# Patient Record
Sex: Female | Born: 1965 | Race: White | Hispanic: No | Marital: Married | Smoking: Never smoker
Health system: Southern US, Community
[De-identification: ages and names within clinical notes are randomized; demographics above are authoritative.]

## PROBLEM LIST (undated history)

## (undated) DIAGNOSIS — R896 Abnormal cytological findings in specimens from other organs, systems and tissues: Secondary | ICD-10-CM

## (undated) DIAGNOSIS — K635 Polyp of colon: Secondary | ICD-10-CM

## (undated) DIAGNOSIS — R350 Frequency of micturition: Secondary | ICD-10-CM

## (undated) DIAGNOSIS — IMO0001 Reserved for inherently not codable concepts without codable children: Secondary | ICD-10-CM

## (undated) DIAGNOSIS — F329 Major depressive disorder, single episode, unspecified: Secondary | ICD-10-CM

## (undated) DIAGNOSIS — F32A Depression, unspecified: Secondary | ICD-10-CM

## (undated) DIAGNOSIS — N920 Excessive and frequent menstruation with regular cycle: Secondary | ICD-10-CM

## (undated) DIAGNOSIS — M8580 Other specified disorders of bone density and structure, unspecified site: Secondary | ICD-10-CM

## (undated) DIAGNOSIS — N83209 Unspecified ovarian cyst, unspecified side: Secondary | ICD-10-CM

## (undated) DIAGNOSIS — I951 Orthostatic hypotension: Secondary | ICD-10-CM

## (undated) DIAGNOSIS — D649 Anemia, unspecified: Secondary | ICD-10-CM

## (undated) DIAGNOSIS — N879 Dysplasia of cervix uteri, unspecified: Secondary | ICD-10-CM

## (undated) DIAGNOSIS — N946 Dysmenorrhea, unspecified: Secondary | ICD-10-CM

## (undated) DIAGNOSIS — R000 Tachycardia, unspecified: Secondary | ICD-10-CM

## (undated) DIAGNOSIS — R42 Dizziness and giddiness: Secondary | ICD-10-CM

## (undated) DIAGNOSIS — C801 Malignant (primary) neoplasm, unspecified: Secondary | ICD-10-CM

## (undated) DIAGNOSIS — R32 Unspecified urinary incontinence: Secondary | ICD-10-CM

## (undated) DIAGNOSIS — Z8619 Personal history of other infectious and parasitic diseases: Secondary | ICD-10-CM

## (undated) DIAGNOSIS — K921 Melena: Secondary | ICD-10-CM

## (undated) DIAGNOSIS — A499 Bacterial infection, unspecified: Secondary | ICD-10-CM

## (undated) DIAGNOSIS — B009 Herpesviral infection, unspecified: Secondary | ICD-10-CM

## (undated) DIAGNOSIS — B379 Candidiasis, unspecified: Secondary | ICD-10-CM

## (undated) DIAGNOSIS — F419 Anxiety disorder, unspecified: Secondary | ICD-10-CM

## (undated) DIAGNOSIS — K625 Hemorrhage of anus and rectum: Secondary | ICD-10-CM

## (undated) DIAGNOSIS — R5383 Other fatigue: Secondary | ICD-10-CM

## (undated) DIAGNOSIS — B373 Candidiasis of vulva and vagina: Secondary | ICD-10-CM

## (undated) DIAGNOSIS — I959 Hypotension, unspecified: Secondary | ICD-10-CM

## (undated) DIAGNOSIS — F3281 Premenstrual dysphoric disorder: Secondary | ICD-10-CM

## (undated) DIAGNOSIS — N840 Polyp of corpus uteri: Secondary | ICD-10-CM

## (undated) DIAGNOSIS — B3731 Acute candidiasis of vulva and vagina: Secondary | ICD-10-CM

## (undated) DIAGNOSIS — B977 Papillomavirus as the cause of diseases classified elsewhere: Secondary | ICD-10-CM

## (undated) DIAGNOSIS — A6920 Lyme disease, unspecified: Secondary | ICD-10-CM

## (undated) DIAGNOSIS — N3289 Other specified disorders of bladder: Secondary | ICD-10-CM

## (undated) DIAGNOSIS — A630 Anogenital (venereal) warts: Secondary | ICD-10-CM

## (undated) DIAGNOSIS — K649 Unspecified hemorrhoids: Secondary | ICD-10-CM

## (undated) HISTORY — DX: Candidiasis, unspecified: B37.9

## (undated) HISTORY — DX: Anxiety disorder, unspecified: F41.9

## (undated) HISTORY — DX: Anogenital (venereal) warts: A63.0

## (undated) HISTORY — DX: Tachycardia, unspecified: R00.0

## (undated) HISTORY — DX: Hemorrhage of anus and rectum: K62.5

## (undated) HISTORY — DX: Other specified disorders of bladder: N32.89

## (undated) HISTORY — DX: Unspecified ovarian cyst, unspecified side: N83.209

## (undated) HISTORY — DX: Abnormal cytological findings in specimens from other organs, systems and tissues: R89.6

## (undated) HISTORY — DX: Other fatigue: R53.83

## (undated) HISTORY — DX: Herpesviral infection, unspecified: B00.9

## (undated) HISTORY — DX: Depression, unspecified: F32.A

## (undated) HISTORY — DX: Other specified disorders of bone density and structure, unspecified site: M85.80

## (undated) HISTORY — DX: Candidiasis of vulva and vagina: B37.3

## (undated) HISTORY — DX: Acute candidiasis of vulva and vagina: B37.31

## (undated) HISTORY — DX: Major depressive disorder, single episode, unspecified: F32.9

## (undated) HISTORY — DX: Dysplasia of cervix uteri, unspecified: N87.9

## (undated) HISTORY — DX: Unspecified urinary incontinence: R32

## (undated) HISTORY — DX: Unspecified hemorrhoids: K64.9

## (undated) HISTORY — PX: HYSTEROSCOPY: SHX211

## (undated) HISTORY — DX: Polyp of colon: K63.5

## (undated) HISTORY — DX: Papillomavirus as the cause of diseases classified elsewhere: B97.7

## (undated) HISTORY — DX: Premenstrual dysphoric disorder: F32.81

## (undated) HISTORY — DX: Orthostatic hypotension: I95.1

## (undated) HISTORY — DX: Frequency of micturition: R35.0

## (undated) HISTORY — DX: Bacterial infection, unspecified: A49.9

## (undated) HISTORY — DX: Polyp of corpus uteri: N84.0

## (undated) HISTORY — DX: Hypotension, unspecified: I95.9

## (undated) HISTORY — DX: Reserved for inherently not codable concepts without codable children: IMO0001

## (undated) HISTORY — PX: NOVASURE ABLATION: SHX5394

## (undated) HISTORY — DX: Excessive and frequent menstruation with regular cycle: N92.0

## (undated) HISTORY — DX: Dizziness and giddiness: R42

## (undated) HISTORY — DX: Lyme disease, unspecified: A69.20

## (undated) HISTORY — PX: DILATION AND CURETTAGE OF UTERUS: SHX78

## (undated) HISTORY — DX: Anemia, unspecified: D64.9

## (undated) HISTORY — DX: Personal history of other infectious and parasitic diseases: Z86.19

## (undated) HISTORY — DX: Dysmenorrhea, unspecified: N94.6

## (undated) HISTORY — DX: Melena: K92.1

---

## 1998-03-11 ENCOUNTER — Inpatient Hospital Stay (HOSPITAL_COMMUNITY): Admission: AD | Admit: 1998-03-11 | Discharge: 1998-03-11 | Payer: Self-pay | Admitting: Obstetrics and Gynecology

## 1998-04-06 ENCOUNTER — Inpatient Hospital Stay (HOSPITAL_COMMUNITY): Admission: AD | Admit: 1998-04-06 | Discharge: 1998-04-08 | Payer: Self-pay | Admitting: Obstetrics and Gynecology

## 1998-07-29 ENCOUNTER — Encounter: Admission: RE | Admit: 1998-07-29 | Discharge: 1998-10-27 | Payer: Self-pay | Admitting: Family Medicine

## 1999-05-05 ENCOUNTER — Ambulatory Visit (HOSPITAL_COMMUNITY): Admission: RE | Admit: 1999-05-05 | Discharge: 1999-05-05 | Payer: Self-pay | Admitting: *Deleted

## 2001-07-26 ENCOUNTER — Other Ambulatory Visit: Admission: RE | Admit: 2001-07-26 | Discharge: 2001-07-26 | Payer: Self-pay | Admitting: Obstetrics and Gynecology

## 2002-02-19 ENCOUNTER — Encounter: Payer: Self-pay | Admitting: Obstetrics and Gynecology

## 2002-02-19 ENCOUNTER — Inpatient Hospital Stay (HOSPITAL_COMMUNITY): Admission: AD | Admit: 2002-02-19 | Discharge: 2002-02-19 | Payer: Self-pay | Admitting: Obstetrics and Gynecology

## 2002-06-12 ENCOUNTER — Inpatient Hospital Stay (HOSPITAL_COMMUNITY): Admission: AD | Admit: 2002-06-12 | Discharge: 2002-06-12 | Payer: Self-pay | Admitting: Obstetrics and Gynecology

## 2002-06-13 ENCOUNTER — Encounter: Payer: Self-pay | Admitting: Obstetrics and Gynecology

## 2002-06-13 ENCOUNTER — Inpatient Hospital Stay (HOSPITAL_COMMUNITY): Admission: AD | Admit: 2002-06-13 | Discharge: 2002-06-13 | Payer: Self-pay | Admitting: Obstetrics and Gynecology

## 2002-08-25 ENCOUNTER — Inpatient Hospital Stay (HOSPITAL_COMMUNITY): Admission: AD | Admit: 2002-08-25 | Discharge: 2002-08-25 | Payer: Self-pay | Admitting: Obstetrics and Gynecology

## 2002-08-30 ENCOUNTER — Inpatient Hospital Stay (HOSPITAL_COMMUNITY): Admission: AD | Admit: 2002-08-30 | Discharge: 2002-08-30 | Payer: Self-pay | Admitting: Obstetrics and Gynecology

## 2002-09-01 ENCOUNTER — Inpatient Hospital Stay (HOSPITAL_COMMUNITY): Admission: AD | Admit: 2002-09-01 | Discharge: 2002-09-04 | Payer: Self-pay | Admitting: Obstetrics and Gynecology

## 2002-09-08 ENCOUNTER — Inpatient Hospital Stay (HOSPITAL_COMMUNITY): Admission: AD | Admit: 2002-09-08 | Discharge: 2002-09-08 | Payer: Self-pay | Admitting: Obstetrics & Gynecology

## 2002-10-15 ENCOUNTER — Other Ambulatory Visit: Admission: RE | Admit: 2002-10-15 | Discharge: 2002-10-15 | Payer: Self-pay | Admitting: Obstetrics and Gynecology

## 2004-01-07 ENCOUNTER — Other Ambulatory Visit: Admission: RE | Admit: 2004-01-07 | Discharge: 2004-01-07 | Payer: Self-pay | Admitting: Obstetrics and Gynecology

## 2004-07-13 ENCOUNTER — Ambulatory Visit (HOSPITAL_COMMUNITY): Admission: RE | Admit: 2004-07-13 | Discharge: 2004-07-13 | Payer: Self-pay | Admitting: Family Medicine

## 2004-11-02 ENCOUNTER — Ambulatory Visit: Payer: Self-pay | Admitting: Gastroenterology

## 2004-11-11 ENCOUNTER — Ambulatory Visit: Payer: Self-pay | Admitting: Gastroenterology

## 2005-02-15 ENCOUNTER — Other Ambulatory Visit: Admission: RE | Admit: 2005-02-15 | Discharge: 2005-02-15 | Payer: Self-pay | Admitting: Obstetrics and Gynecology

## 2005-10-27 ENCOUNTER — Ambulatory Visit (HOSPITAL_COMMUNITY): Admission: RE | Admit: 2005-10-27 | Discharge: 2005-10-27 | Payer: Self-pay | Admitting: Interventional Cardiology

## 2005-12-01 ENCOUNTER — Encounter: Admission: RE | Admit: 2005-12-01 | Discharge: 2005-12-01 | Payer: Self-pay | Admitting: Obstetrics and Gynecology

## 2006-02-16 ENCOUNTER — Other Ambulatory Visit: Admission: RE | Admit: 2006-02-16 | Discharge: 2006-02-16 | Payer: Self-pay | Admitting: Obstetrics and Gynecology

## 2006-12-06 ENCOUNTER — Encounter: Admission: RE | Admit: 2006-12-06 | Discharge: 2006-12-06 | Payer: Self-pay | Admitting: Family Medicine

## 2007-06-08 ENCOUNTER — Ambulatory Visit (HOSPITAL_COMMUNITY): Admission: RE | Admit: 2007-06-08 | Discharge: 2007-06-08 | Payer: Self-pay | Admitting: Family Medicine

## 2008-01-29 ENCOUNTER — Encounter: Admission: RE | Admit: 2008-01-29 | Discharge: 2008-01-29 | Payer: Self-pay | Admitting: Family Medicine

## 2008-11-28 ENCOUNTER — Encounter: Admission: RE | Admit: 2008-11-28 | Discharge: 2008-11-28 | Payer: Self-pay | Admitting: Family Medicine

## 2009-06-23 ENCOUNTER — Encounter: Admission: RE | Admit: 2009-06-23 | Discharge: 2009-06-23 | Payer: Self-pay | Admitting: Family Medicine

## 2009-06-26 ENCOUNTER — Encounter (INDEPENDENT_AMBULATORY_CARE_PROVIDER_SITE_OTHER): Payer: Self-pay | Admitting: Obstetrics and Gynecology

## 2009-06-26 ENCOUNTER — Ambulatory Visit (HOSPITAL_COMMUNITY): Admission: RE | Admit: 2009-06-26 | Discharge: 2009-06-26 | Payer: Self-pay | Admitting: Obstetrics and Gynecology

## 2009-10-08 ENCOUNTER — Encounter (INDEPENDENT_AMBULATORY_CARE_PROVIDER_SITE_OTHER): Payer: Self-pay | Admitting: *Deleted

## 2010-06-10 ENCOUNTER — Encounter (INDEPENDENT_AMBULATORY_CARE_PROVIDER_SITE_OTHER): Payer: Self-pay | Admitting: *Deleted

## 2010-09-28 ENCOUNTER — Ambulatory Visit (HOSPITAL_COMMUNITY)
Admission: RE | Admit: 2010-09-28 | Discharge: 2010-09-28 | Payer: Self-pay | Source: Home / Self Care | Admitting: Obstetrics and Gynecology

## 2010-11-14 ENCOUNTER — Encounter: Payer: Self-pay | Admitting: Interventional Cardiology

## 2010-11-14 ENCOUNTER — Other Ambulatory Visit: Payer: Self-pay | Admitting: Obstetrics and Gynecology

## 2010-11-14 DIAGNOSIS — Z1231 Encounter for screening mammogram for malignant neoplasm of breast: Secondary | ICD-10-CM

## 2010-11-14 DIAGNOSIS — Z1239 Encounter for other screening for malignant neoplasm of breast: Secondary | ICD-10-CM

## 2010-11-15 ENCOUNTER — Encounter: Payer: Self-pay | Admitting: Family Medicine

## 2010-11-24 ENCOUNTER — Encounter (INDEPENDENT_AMBULATORY_CARE_PROVIDER_SITE_OTHER): Payer: Self-pay | Admitting: *Deleted

## 2010-11-26 NOTE — Letter (Signed)
Summary: Pre Visit Letter Revised  [facility]  [address] [place], [place] [address]   Phone: [phone]  Fax: [phone]    06/10/2010 MRN: [id number]     Autumn Foster [address] [place], [place]  [address]              Procedure Date:  08/11/2010    Welcome to the Gastroenterology Division at [facility].    You are scheduled to see a nurse for your pre-procedure visit on 08/03/2010 at 1:00pm on the 3rd floor at [facility], [address]  We ask that you try to arrive at our office 15 minutes prior to your appointment time to allow for check-in.  Please take a minute to review the attached form.  If you answer "Yes" to one or more of the questions on the first page, we ask that you call the person listed at your earliest opportunity.  If you answer "No" to all of the questions, please complete the rest of the form and bring it to your appointment.    Your nurse visit will consist of discussing your medical and surgical history, your immediate family medical history, and your medications.    If you are unable to list all of your medications on the form, please bring the medication bottles to your appointment and we will list them.  We will need to be aware of both prescribed and over the counter drugs.  We will need to know exact dosage information as well.    Please be prepared to read and sign documents such as consent forms, a financial agreement, and acknowledgement forms.  If necessary, and with your consent, a friend or relative is welcome to sit-in on the nurse visit with you.  Please bring your insurance card so that we may make a copy of it.  If your insurance requires a referral to see a specialist, please bring your referral form from your primary care physician.  No co-pay is required for this nurse visit.     If you cannot keep your appointment, please call [phone] to cancel or reschedule prior to your appointment date.  This  allows [place] the opportunity to schedule an appointment for another patient in need of care.   Thank you for choosing [facility] for your medical needs.  We appreciate the opportunity to care for you.  Please visit [place] at our website  to learn more about our practice.           Sincerely,  The Gastroenterology Division

## 2010-12-01 ENCOUNTER — Ambulatory Visit: Payer: Self-pay

## 2010-12-02 NOTE — Letter (Signed)
Summary: Pre Visit Letter Revised  [facility]  [address] [place], [place] [address]   Phone: [phone]  Fax: [phone]        11/24/2010 MRN: [id number] Autumn Foster [address] [place], [place]  [address]             Procedure Date:  12/28/2010 @ 11:30   Recall colon-Dr. Russella Dar  Welcome to the Gastroenterology Division at [facility].    You are scheduled to see a nurse for your pre-procedure visit on 12/14/2010 at 8:30 on the 3rd floor at [facility], [address].  We ask that you try to arrive at our office 15 minutes prior to your appointment time to allow for check-in.  Please take a minute to review the attached form.  If you answer "Yes" to one or more of the questions on the first page, we ask that you call the person listed at your earliest opportunity.  If you answer "No" to all of the questions, please complete the rest of the form and bring it to your appointment.    Your nurse visit will consist of discussing your medical and surgical history, your immediate family medical history, and your medications.   If you are unable to list all of your medications on the form, please bring the medication bottles to your appointment and we will list them.  We will need to be aware of both prescribed and over the counter drugs.  We will need to know exact dosage information as well.    Please be prepared to read and sign documents such as consent forms, a financial agreement, and acknowledgement forms.  If necessary, and with your consent, a friend or relative is welcome to sit-in on the nurse visit with you.  Please bring your insurance card so that we may make a copy of it.  If your insurance requires a referral to see a specialist, please bring your referral form from your primary care physician.  No co-pay is required for this nurse visit.     If you cannot keep your appointment, please call [phone] to cancel or reschedule prior to your  appointment date.  This allows [place] the opportunity to schedule an appointment for another patient in need of care.    Thank you for choosing [facility] for your medical needs.  We appreciate the opportunity to care for you.  Please visit [place] at our website  to learn more about our practice.  Sincerely, The Gastroenterology Division

## 2010-12-03 ENCOUNTER — Ambulatory Visit: Payer: Self-pay

## 2010-12-15 ENCOUNTER — Encounter (INDEPENDENT_AMBULATORY_CARE_PROVIDER_SITE_OTHER): Payer: Self-pay | Admitting: *Deleted

## 2010-12-16 ENCOUNTER — Ambulatory Visit
Admission: RE | Admit: 2010-12-16 | Discharge: 2010-12-16 | Disposition: A | Discharge status: Home / Self Care | Payer: BC Managed Care – PPO | Source: Ambulatory Visit | Attending: Obstetrics and Gynecology | Admitting: Obstetrics and Gynecology

## 2010-12-16 ENCOUNTER — Encounter: Payer: Self-pay | Admitting: Gastroenterology

## 2010-12-16 DIAGNOSIS — Z1231 Encounter for screening mammogram for malignant neoplasm of breast: Secondary | ICD-10-CM

## 2010-12-22 NOTE — Letter (Signed)
Summary: [facility] Instructions  [facility]  [address] [place], [place] [address]   Phone: [phone]  Fax: [phone]       Autumn Foster    25-Dec-1965    MRN: [id number]        Procedure Day Dorna Bloom:  Duanne Limerick 12/28/10     Arrival Time: 10:30am     Procedure Time:  11:30am     Location of Procedure:                    _X _  [facility] (4th Floor)                       PREPARATION FOR COLONOSCOPY WITH MOVIPREP   Starting 5 days prior to your procedure  [facility] : [facility] 02/29  do not eat nuts, seeds, popcorn, corn, beans, peas,  salads, or any raw vegetables.  Do not take any fiber supplements (e.g. Metamucil, Citrucel, and Benefiber).  THE DAY BEFORE YOUR PROCEDURE         DATE: SUNDAY 03/04  1.  Drink clear liquids the entire day-NO SOLID FOOD  2.  Do not drink anything colored red or purple.  Avoid juices with pulp.  No orange juice.  3.  Drink at least 64 oz. (8 glasses) of fluid/clear liquids during the day to prevent dehydration and help the prep work efficiently.  CLEAR LIQUIDS INCLUDE: Water Jello Ice Popsicles Tea (sugar ok, no milk/cream) Powdered fruit flavored drinks Coffee (sugar ok, no milk/cream) Gatorade Juice: apple, white grape, white cranberry  Lemonade Clear bullion, consomm, broth Carbonated beverages (any kind) Strained chicken noodle soup Hard Candy                             4.  In the morning, mix first dose of MoviPrep solution:    Empty 1 Pouch A and 1 Pouch B into the disposable container    Add lukewarm drinking water to the top line of the container. Mix to dissolve    Refrigerate (mixed solution should be used within 24 hrs)  5.  Begin drinking the prep at 5:00 p.m. The MoviPrep container is divided by 4 marks.   Every 15 minutes drink the solution down to the next mark (approximately 8 oz) until the full liter is complete.   6.  Follow completed prep with 16 oz of clear liquid of your choice (Nothing  red or purple).  Continue to drink clear liquids until bedtime.  7.  Before going to bed, mix second dose of MoviPrep solution:    Empty 1 Pouch A and 1 Pouch B into the disposable container    Add lukewarm drinking water to the top line of the container. Mix to dissolve    Refrigerate  THE DAY OF YOUR PROCEDURE      DATE: MONDAY 03/05  Beginning at  6:30a.m. (5 hours before procedure):         1. Every 15 minutes, drink the solution down to the next mark (approx 8 oz) until the full liter is complete.  2. Follow completed prep with 16 oz. of clear liquid of your choice.    3. You may drink clear liquids until 9:30am  (2 HOURS BEFORE PROCEDURE).   MEDICATION INSTRUCTIONS  Unless otherwise instructed, you should take regular prescription medications with a small sip of water   as early as possible the morning of your  procedure.      OTHER INSTRUCTIONS  You will need a responsible adult at least 46 years of age to accompany you and drive you home.   This person must remain in the waiting room during your procedure.  Wear loose fitting clothing that is easily removed.  Leave jewelry and other valuables at home.  However, you may wish to bring a book to read or  an iPod/MP3 player to listen to music as you wait for your procedure to start.  Remove all body piercing jewelry and leave at home.  Total time from sign-in until discharge is approximately 2-3 hours.  You should go home directly after your procedure and rest.  You can resume normal activities the  day after your procedure.  The day of your procedure you should not:   Drive   Make legal decisions   Operate machinery   Drink alcohol   Return to work  You will receive specific instructions about eating, activities and medications before you leave.    The above instructions have been reviewed and explained to me by   Karl Bales RN  December 16, 2010 4:01 PM   I fully understand and can  verbalize these instructions _____________________________ Date _________

## 2010-12-22 NOTE — Miscellaneous (Signed)
Summary: [facility] previsit  Clinical Lists Changes  Medications: Added new medication of MOVIPREP 100 GM  SOLR (PEG-KCL-NACL-NASULF-NA ASC-C) As per prep instructions. - Signed Rx of MOVIPREP 100 GM  SOLR (PEG-KCL-NACL-NASULF-NA ASC-C) As per prep instructions.;  #1 x 0;  Signed;  Entered by: Karl Bales RN;  Authorized by: Meryl Dare MD [facility];  Method used: Electronically to [facility] # [id number]*, [address], [place], [place]  [address], Ph: [phone], Fax: [phone] Allergies: Added new allergy or adverse reaction of PCN Observations: Added new observation of NKA: F (12/16/2010 15:33)    Prescriptions: MOVIPREP 100 GM  SOLR (PEG-KCL-NACL-NASULF-NA ASC-C) As per prep instructions.  #1 x 0   Entered by:   Karl Bales RN   Authorized by:   Meryl Dare MD [facility]   Signed by:   Karl Bales RN on 12/16/2010   Method used:   Electronically to        [facility] [address] (retail)       [address]       [place], [place]  [address]       Ph: [phone]       Fax: [phone]   RxID:   [id number]

## 2010-12-28 ENCOUNTER — Other Ambulatory Visit: Payer: Self-pay | Admitting: Gastroenterology

## 2010-12-28 ENCOUNTER — Other Ambulatory Visit (AMBULATORY_SURGERY_CENTER): Payer: BC Managed Care – PPO | Admitting: Gastroenterology

## 2010-12-28 DIAGNOSIS — Z800 Family history of malignant neoplasm of digestive organs: Secondary | ICD-10-CM

## 2010-12-28 DIAGNOSIS — K648 Other hemorrhoids: Secondary | ICD-10-CM

## 2010-12-28 DIAGNOSIS — D126 Benign neoplasm of colon, unspecified: Secondary | ICD-10-CM

## 2010-12-28 DIAGNOSIS — Z1211 Encounter for screening for malignant neoplasm of colon: Secondary | ICD-10-CM

## 2010-12-29 HISTORY — PX: COLONOSCOPY: SHX174

## 2010-12-31 ENCOUNTER — Encounter: Payer: Self-pay | Admitting: Gastroenterology

## 2011-01-04 LAB — CBC
MCH: 33.5 pg (ref 26.0–34.0)
MCV: 98.4 fL (ref 78.0–100.0)
RBC: 3.98 MIL/uL (ref 3.87–5.11)
RDW: 13.1 % (ref 11.5–15.5)

## 2011-01-04 LAB — VITAMIN D 1,25 DIHYDROXY
Vitamin D 1, 25 (OH)2 Total: 61 pg/mL (ref 18–72)
Vitamin D2 1, 25 (OH)2: <8 pg/mL
Vitamin D3 1, 25 (OH)2: 61 pg/mL

## 2011-01-05 NOTE — Procedures (Addendum)
Summary: Colonoscopy  Patient: Autumn Foster Note: All result statuses are Final unless otherwise noted.  Tests: (1) Colonoscopy (COL)   COL Colonoscopy           DONE     [facility]     [address]     [place], [place]  [address]          COLONOSCOPY PROCEDURE REPORT     PATIENT:  Libra, Gatz  MR#:  [id number]     BIRTHDATE:  08-16-1966, 45 yrs. old  GENDER:  female     ENDOSCOPIST:  Judie Petit T. Russella Dar, MD, [facility]          PROCEDURE DATE:  12/28/2010     PROCEDURE:  Colonoscopy with snare polypectomy     ASA CLASS:  Class II     INDICATIONS:  1) Elevated Risk Screening  2) family history of     colon cancer: PGF.     MEDICATIONS:   Fentanyl 50 mcg IV, Versed 6 mg IV, Benadryl 25 mg     IV     DESCRIPTION OF PROCEDURE:   After the risks benefits and     alternatives of the procedure were thoroughly explained, informed     consent was obtained.  Digital rectal exam was performed and     revealed external hemorrhoids., small. The LB PCF-H180AL [id number]     endoscope was introduced through the anus and advanced to the     cecum, which was identified by both the appendix and ileocecal     valve, limited by a tortuous colon.  The quality of the prep was     excellent, using MoviPrep.  The instrument was then slowly     withdrawn as the colon was fully examined.     <<PROCEDUREIMAGES>>     FINDINGS:  A sessile polyp was found in the descending colon. It     was 5 mm in size. Polyp was snared without cautery. Retrieval was     successful.  A sessile polyp was found in the sigmoid colon. It     was 5 mm in size. Polyp was snared without cautery.  Retrieval was     successful. A normal appearing cecum, ileocecal valve, and     appendiceal orifice were identified. The ascending, hepatic     flexure, transverse, splenic flexure, and rectum appeared     unremarkable. Retroflexed views in the rectum revealed internal     hemorrhoids, small. The time to cecum =  4.5  minutes. The  scope     was then withdrawn (time =  12.75  min) from the patient and the     procedure completed.          COMPLICATIONS:  None          ENDOSCOPIC IMPRESSION:     1) 5 mm sessile polyp in the descending colon     2) 5 mm sessile polyp in the sigmoid colon     3) Internal and external hemorrhoids          RECOMMENDATIONS:     1) Await pathology results     2) If the polyps are adenomatous (pre-cancerous), colonoscopy in     5 years. Otherwise follow colorectal cancer screening guidelines     for "routine risk" patients with colonoscopy in 10 years.          Venita Lick. Russella Dar, MD, Clement[facility] Graham  CC:  Merri Brunette, MD          n.     Rosalie DoctorVenita Lick. Stark at 12/28/2010 12:22 PM          Murtis Sink, [id number]  Note: An exclamation mark (!) indicates a result that was not dispersed into the flowsheet. Document Creation Date: 12/28/2010 12:22 PM _______________________________________________________________________  (1) Order result status: Final Collection or observation date-time: 12/28/2010 12:14 Requested date-time:  Receipt date-time:  Reported date-time:  Referring Physician:   Ordering Physician: Claudette Head [phone]) Specimen Source:  Source: Launa Grill Order Number: [id number] Lab site:   Appended Document: Colonoscopy

## 2011-01-12 NOTE — Letter (Signed)
Summary: Patient Notice- Polyp Results  [facility]  [address] [place], [place] [address]   Phone: [phone]  Fax: [phone]        December 31, 2010 MRN: [id number]    [facility] [address] [place], [place]  [address]    Dear Ms. Chandonnet,  I am pleased to inform you that the colon polyp(s) removed during your recent colonoscopy was (were) found to be benign (no cancer detected) upon pathologic examination.  I recommend you have a repeat colonoscopy examination in 5 years to look for recurrent polyps, as having colon polyps increases your risk for having recurrent polyps or even colon cancer in the future.  Should you develop new or worsening symptoms of abdominal pain, bowel habit changes or bleeding from the rectum or bowels, please schedule an evaluation with either your primary care physician or with me.  Continue treatment plan as outlined the day of your exam.  Please call us if you are having persistent problems or have questions about your condition that have not been fully answered at this time.  Sincerely,  Meryl Dare MD [facility]  This letter has been electronically signed by your physician.  Appended Document: Patient Notice- Polyp Results letter mailed

## 2011-01-29 LAB — CBC
HCT: 38.7 % (ref 36.0–46.0)
Hemoglobin: 13.1 g/dL (ref 12.0–15.0)
MCHC: 33.9 g/dL (ref 30.0–36.0)
Platelets: 213 10*3/uL (ref 150–400)
RBC: 4.03 MIL/uL (ref 3.87–5.11)
RDW: 13.4 % (ref 11.5–15.5)

## 2011-01-29 LAB — CARBOXYHEMOGLOBIN: Methemoglobin: 0.9 % (ref 0.0–1.5)

## 2011-01-29 LAB — VITAMIN D 1,25 DIHYDROXY

## 2011-03-09 NOTE — Op Note (Signed)
NAMEZENIAH, Autumn Foster                ACCOUNT NO.:  [id number]   MEDICAL RECORD NO.:  [id number]          PATIENT TYPE:  AMB   LOCATION:  SDC                           FACILITY:  [facility]   PHYSICIAN:  Hal Morales, M.D.DATE OF BIRTH:  Dec 14, 1965   DATE OF PROCEDURE:  06/26/2009  DATE OF DISCHARGE:  06/26/2009                               OPERATIVE REPORT   PREOPERATIVE DIAGNOSIS:  Atypical glandular cells of undetermined  significance on Pap smear, question of endometrial polyp.   POSTOPERATIVE DIAGNOSIS:  Atypical glandular cells of undetermined  significance on Pap smear.   PROCEDURES:  Hysteroscopy, dilatation and curettage.   SURGEON:  Hal Morales, MD   ANESTHESIA:  General, LMA.   ESTIMATED BLOOD LOSS:  Less than 10 mL.   COMPLICATIONS:  None.   FINDINGS:  Uterus sounded to 8 cm.  At the time of hysteroscopy, no  specific lesions were noted though there was shaggy endometrium  throughout.  It should be noted that the patient is on the last day of  her menstrual cycle.   PROCEDURE IN DETAILS:  The patient was taken to the operating room after  appropriate identification and placed on the operating table.  After the  attainment of adequate general anesthesia, she was placed in the  lithotomy position.  The perineum and vagina were prepped with multiple  layers of Betadine and a red Robinson catheter was used to empty the  bladder.  The perineum was draped as a sterile field.  A Graves speculum  was placed in the vagina and a paracervical block achieved with a total  of 10 mL of 2% Xylocaine in the 5 and 7 o'clock positions.  The uterus  was then sounded and the cervix successively dilated to accommodate the  hysteroscope.  The hysteroscope was then used to reveal and document the  above-noted findings.  The hysteroscope was removed and curettage of all  quadrants of the uterus was undertaken.  The hysteroscope was then  replaced and documentation of clearing of the  endometrial cavity was  noted.  All instruments were then removed from the vagina and the  patient awakened from general anesthesia and taken to the recovery room  in satisfactory condition having tolerated the procedure well with  sponge and instrument counts correct.   The patient was given Toradol 30 mg IV and 30 mg IM prior to leaving the  operating room.   DISCHARGE INSTRUCTIONS:  Printed instructions for D and C from the  [facility].   DISCHARGE MEDICATIONS:  Ibuprofen 600 mg p.o. q.6 h. for 4 doses, then  p.r.n. pain.   FOLLOWUP:  The patient will follow up with Dr. Pennie Rushing in 2 weeks.      Hal Morales, M.D.  Electronically Signed     VPH/MEDQ  D:  06/26/2009  T:  06/27/2009  Job:  [id number]

## 2011-03-31 ENCOUNTER — Other Ambulatory Visit: Payer: Self-pay | Admitting: Obstetrics and Gynecology

## 2011-03-31 DIAGNOSIS — N63 Unspecified lump in unspecified breast: Secondary | ICD-10-CM

## 2011-03-31 DIAGNOSIS — N649 Disorder of breast, unspecified: Secondary | ICD-10-CM

## 2011-04-07 ENCOUNTER — Other Ambulatory Visit: Payer: BC Managed Care – PPO

## 2011-11-17 ENCOUNTER — Other Ambulatory Visit: Payer: Self-pay | Admitting: Obstetrics and Gynecology

## 2011-11-17 DIAGNOSIS — Z1231 Encounter for screening mammogram for malignant neoplasm of breast: Secondary | ICD-10-CM

## 2012-01-20 ENCOUNTER — Ambulatory Visit: Payer: BC Managed Care – PPO

## 2012-01-25 ENCOUNTER — Ambulatory Visit
Admission: RE | Admit: 2012-01-25 | Discharge: 2012-01-25 | Disposition: A | Discharge status: Home / Self Care | Payer: BC Managed Care – PPO | Source: Ambulatory Visit | Attending: Obstetrics and Gynecology | Admitting: Obstetrics and Gynecology

## 2012-01-25 DIAGNOSIS — Z1231 Encounter for screening mammogram for malignant neoplasm of breast: Secondary | ICD-10-CM

## 2012-05-10 ENCOUNTER — Ambulatory Visit: Payer: Self-pay | Admitting: Obstetrics and Gynecology

## 2012-05-26 ENCOUNTER — Encounter: Payer: Self-pay | Admitting: Obstetrics and Gynecology

## 2012-05-26 ENCOUNTER — Ambulatory Visit (INDEPENDENT_AMBULATORY_CARE_PROVIDER_SITE_OTHER): Payer: BC Managed Care – PPO | Admitting: Obstetrics and Gynecology

## 2012-05-26 VITALS — BP 96/60 | HR 66 | Resp 14 | Ht 72.0 in | Wt 152.0 lb

## 2012-05-26 DIAGNOSIS — Z01419 Encounter for gynecological examination (general) (routine) without abnormal findings: Secondary | ICD-10-CM

## 2012-05-26 DIAGNOSIS — Z124 Encounter for screening for malignant neoplasm of cervix: Secondary | ICD-10-CM

## 2012-05-26 DIAGNOSIS — F529 Unspecified sexual dysfunction not due to a substance or known physiological condition: Secondary | ICD-10-CM

## 2012-05-26 MED ORDER — AMBULATORY NON FORMULARY MEDICATION: 0.5000 cm | Status: DC | PRN

## 2012-05-26 NOTE — Progress Notes (Signed)
Subjective:    Autumn Foster is a 46 y.o. female, [phone], who presents for an annual exam. The patient reports more difficulty desiring sex and that having an orgasm is more difficult.  Denies any relationship issues and has a supportive partner.  Menstrual cycle:   LMP: No LMP recorded. Patient has had an ablation.             Review of Systems Pertinent items are noted in HPI. Denies pelvic pain, urinary tract symptoms, vaginitis symptoms, irregular bleeding, menopausal symptoms, change in bowel habits or rectal bleeding   Objective:    BP 96/60  Pulse 66  Resp 14  Ht 6' (1.829 m)  Wt 152 lb (68.947 kg)  BMI 20.61 kg/m2   Wt Readings from Last 1 Encounters:  05/26/12 152 lb (68.947 kg)   Body mass index is 20.61 kg/(m^2). General Appearance: Alert, no acute distress HEENT: Grossly normal Neck / Thyroid: Supple, no thyromegaly or cervical adenopathy Lungs: Clear to auscultation bilaterally Back: No CVA tenderness Breast Exam: No masses or nodes.No dimpling, nipple retraction or discharge. Cardiovascular: Regular rate and rhythm.  Gastrointestinal: Soft, non-tender, no masses or organomegaly Pelvic Exam: EGBUS-wnl, vagina-normal rugae, cervix- without lesions or tenderness, uterus appears normal size shape and consistency, adnexae-no masses or tenderness Rectovaginal: no masses and normal sphincter tone Lymphatic Exam: Non-palpable nodes in neck, clavicular,  axillary, or inguinal regions  Skin: no rashes or abnormalities Extremities: no clubbing cyanosis or edema  Neurologic: grossly normal Psychiatric: Alert and oriented    Assessment:   Routine GYN Exam Decreased Libido   Plan:  Testosterone-pending  Yell Gel # 10 grams apply small amount to clitoral area 30 minutes before intercourse 6 refills  Given written and online resources to explore for female libido issues  PAP sent  RTO 1 year or prn  Tywone Bembenek,ELMIRAPA-C

## 2012-05-26 NOTE — Progress Notes (Signed)
Regular Periods: no Mammogram: yes 01/2012 WNL  Monthly Breast Ex.: no Exercise: yes  Tetanus < 10 years: no Seatbelts: yes  NI. Bladder Functn.: yes Abuse at home: no  Daily BM's: yes Stressful Work: yes  Healthy Diet: yes Sigmoid-Colonoscopy: "2012" Polyps  Calcium: yes Medical problems this year: pt wants to discuss that she has no sex drive.    LAST PAP:04/03/2011  Contraception: None  Mammogram:  01/2012 "WNL"  PCP: Severiano Gilbert at [facility]  PMH: No Changes  FMH: No Changes  Last Bone Scan: 03/31/11

## 2012-05-29 LAB — PAP IG W/ RFLX HPV ASCU

## 2012-05-29 LAB — TESTOSTERONE, FREE, TOTAL, SHBG
Sex Hormone Binding: 74 nmol/L (ref 18–114)
Testosterone: <10 ng/dL — ABNORMAL LOW (ref 10–70)

## 2012-05-30 ENCOUNTER — Telehealth: Payer: Self-pay | Admitting: Obstetrics and Gynecology

## 2012-05-30 NOTE — Telephone Encounter (Signed)
Left patient a message that a return call would be made to discuss results of labs.  Annabelle Rexroad, PA-C

## 2012-05-31 ENCOUNTER — Telehealth: Payer: Self-pay | Admitting: Obstetrics and Gynecology

## 2012-05-31 NOTE — Telephone Encounter (Signed)
TC FROM PT STATING THAT SHE WOULD LIKE FOR [place] TO CALL IN RX TESTERONE THAT SHE SPOKE TO EP ABOUT. ASK PT DID EP GO OVER RISKS AND PT STATED YES. WILL CALL IN TESTERONE 0.1 MG CREAM  #2 MONTH SUPPLY APPLY 0.1 MG  3 TIMES A WEEK WITH 0 REFILLS TO PT PHARMACY.

## 2012-05-31 NOTE — Telephone Encounter (Signed)
Call to patient to relay testosterone results-undetectable. Reviewed with patient the risks of excess testosterone to include, but not limited to: increased lipids, unwanted hair growth, female pattern baldness,deepening of voice, clitoromegaly, acne, oily skin and aggression. Patient would like to try a small amount of testosterone for 8 weeks to see if it makes a difference in her libido. Patient is currently on vacation, will return on August 17th and will call at that time to order through [facility] in [place], [place].  Will order:  Testosterone 0.1 mg Cream  # 2 month supply  apply 0.1 mg 3 times a week  no refills.   Seerat Peaden, PA-C

## 2012-05-31 NOTE — Telephone Encounter (Signed)
TRIAGE/EPIC °

## 2012-11-07 ENCOUNTER — Ambulatory Visit (INDEPENDENT_AMBULATORY_CARE_PROVIDER_SITE_OTHER): Payer: BC Managed Care – PPO | Admitting: Family Medicine

## 2012-11-07 ENCOUNTER — Encounter: Payer: Self-pay | Admitting: Family Medicine

## 2012-11-07 VITALS — BP 118/76 | HR 79 | Temp 98.7°F | Ht 72.0 in | Wt 155.0 lb

## 2012-11-07 DIAGNOSIS — F411 Generalized anxiety disorder: Secondary | ICD-10-CM

## 2012-11-07 DIAGNOSIS — Z7689 Persons encountering health services in other specified circumstances: Secondary | ICD-10-CM

## 2012-11-07 DIAGNOSIS — Z7189 Other specified counseling: Secondary | ICD-10-CM

## 2012-11-07 DIAGNOSIS — R109 Unspecified abdominal pain: Secondary | ICD-10-CM

## 2012-11-07 DIAGNOSIS — R350 Frequency of micturition: Secondary | ICD-10-CM

## 2012-11-07 DIAGNOSIS — F419 Anxiety disorder, unspecified: Secondary | ICD-10-CM

## 2012-11-07 LAB — CBC WITH DIFFERENTIAL/PLATELET
Basophils Absolute: 0 10*3/uL (ref 0.0–0.1)
Eosinophils Relative: 0.9 % (ref 0.0–5.0)
Lymphs Abs: 1.2 10*3/uL (ref 0.7–4.0)
Monocytes Absolute: 0.4 10*3/uL (ref 0.1–1.0)
Monocytes Relative: 7.6 % (ref 3.0–12.0)
Neutrophils Relative %: 65.9 % (ref 43.0–77.0)
Platelets: 220 10*3/uL (ref 150.0–400.0)
RDW: 13.3 % (ref 11.5–14.6)
WBC: 4.9 10*3/uL (ref 4.5–10.5)

## 2012-11-07 LAB — COMPREHENSIVE METABOLIC PANEL
ALT: 25 U/L (ref 0–35)
Albumin: 4.4 g/dL (ref 3.5–5.2)
Alkaline Phosphatase: 42 U/L (ref 39–117)
CO2: 27 mEq/L (ref 19–32)
Glucose, Bld: 91 mg/dL (ref 70–99)
Potassium: 4.3 mEq/L (ref 3.5–5.1)
Sodium: 138 mEq/L (ref 135–145)
Total Bilirubin: 0.8 mg/dL (ref 0.3–1.2)
Total Protein: 7.3 g/dL (ref 6.0–8.3)

## 2012-11-07 LAB — LIPID PANEL
Cholesterol: 196 mg/dL (ref 0–200)
LDL Cholesterol: 98 mg/dL (ref 0–99)
VLDL: 6.2 mg/dL (ref 0.0–40.0)

## 2012-11-07 LAB — HEMOGLOBIN A1C: Hgb A1c MFr Bld: 5.4 % (ref 4.6–6.5)

## 2012-11-07 NOTE — Progress Notes (Signed)
Chief Complaint  Patient presents with  . Establish Care  . abdomen discomfort    since before Thanksgiving;     HPI:  Autumn Foster is here to establish care. Previous PCP was Dr. Katrinka Blazing at [facility]. Pt had physical with Dr. Ivar Bury. (gynecologist in 8.2013). Work: homemaker, 3 children Home Situation: lives with 3 kids and husband Spiritual Beliefs:   Has the following chronic problems and concerns today:  Abdominal Discomfort: -started after cool sculpting a few months ago -is "discomfort" not pain, feels like a "stitch" -constant, no hx of abd surgeries -denies: nausea, vomiting, diarrhea, constipation, fevers, weight loss, belching, bloating -feels like stool smell bad - this is chronic -feels like abdomen is thick here - worried about cancer, grandfather had colon cancer (70yo) -she had colonoscopy last year with a polyp, Dr. Russella Dar with [facility]  Anxiety: -worries all the time -sometimes feels panicked -does not see a counselor -FH of depression - mother committed suicide  Other Providers: -Dr. Lowell Guitar, GYN  Health Maintenance: -pap 8.2013 -vaccines: refused influenza, has not had tetanus booster in a long time - refuses this -mammo 1.2013 ROS: See pertinent positives and negatives per HPI.  Past Medical History  Diagnosis Date  . History of chicken pox   . Yeast infection   . Bacterial infection   . Herpes   . Urinary frequency   . Anemia   . Osteopenia   . Candida vaginitis   . Ovarian cyst   . Anxiety   . Menorrhagia   . Endometrial polyp   . ASCUS on Pap smear   . PMDD (premenstrual dysphoric disorder)   . Bladder spasm   . Dysmenorrhea   . HPV (human papilloma virus) infection   . Rectal bleed   . Cervical dysplasia   . Blood in stool   . Depression   . Malignancy   . Genital warts   . Urine incontinence   . Colon polyp     Family History  Problem Relation Age of Onset  . Thyroid disease Maternal Grandmother   . Cancer Father    prostate/renal  . Hypertension Father   . Diabetes Maternal Grandfather   . Migraines Mother     History   Social History  . Marital Status: Married    Spouse Name: N/A    Number of Children: N/A  . Years of Education: N/A   Social History Main Topics  . Smoking status: Never Smoker   . Smokeless tobacco: Never Used  . Alcohol Use: Yes     Comment: socially   . Drug Use: No  . Sexually Active: Yes     Comment: vasectony   Other Topics Concern  . None   Social History Narrative  . None    Current outpatient prescriptions:AMBULATORY NON FORMULARY MEDICATION, Apply 0.5 cm topically as needed. Medication Name: Yell Gel apply small amount to clitoral area 30 minutes before intercourse, Disp: 10 g, Rfl: 6;  Ascorbic Acid (VITAMIN C) 100 MG tablet, Take 100 mg by mouth daily., Disp: , Rfl: ;  ASTAXANTHIN PO, Take by mouth., Disp: , Rfl: ;  B Complex Vitamins (B COMPLEX 1 PO), Take by mouth., Disp: , Rfl:  cholecalciferol (VITAMIN D) 1000 UNITS tablet, Take 1,000 Units by mouth daily., Disp: , Rfl: ;  fish oil-omega-3 fatty acids 1000 MG capsule, Take 2 g by mouth daily., Disp: , Rfl: ;  MAGNESIUM PO, Take by mouth., Disp: , Rfl: ;  NONFORMULARY OR COMPOUNDED ITEM, Testosterone  Compound, Disp: , Rfl: ;  Turmeric, Curcuma Longa, (CURCUMIN) POWD, by Does not apply route., Disp: , Rfl:  valACYclovir (VALTREX) 500 MG tablet, Take 500 mg by mouth 2 (two) times daily., Disp: , Rfl:   EXAM:  Filed Vitals:   11/07/12 0829  BP: 118/76  Pulse: 79  Temp: 98.7 F (37.1 C)    Body mass index is 21.02 kg/(m^2).  GENERAL: vitals reviewed and listed above, alert, oriented, appears well hydrated and in no acute distress  HEENT: atraumatic, conjunttiva clear, no obvious abnormalities on inspection of external nose and ears  NECK: no obvious masses on inspection  LUNGS: clear to auscultation bilaterally, no wheezes, rales or rhonchi, good air movement  CV: HRRR, no peripheral edema  ABD:  BS +, soft, superficial TTP in mid RUQ mild, no deeper TTP, hernia, organomegaly, rebound or guarding  MS: moves all extremities without noticeable abnormality  PSYCH: pleasant and cooperative, no obvious depression or anxiety  ASSESSMENT AND PLAN:  Discussed the following assessment and plan:  1. Abdominal pain  -could be nerve damage related to cool sculpting -will get some basic labs, if continued symptoms may consider [place] or GI referral CBC with Differential, CMP  2. Establishing care with new doctor, encounter for  CBC with Differential, CMP, Lipid Panel, Hemoglobin A1c, Vitamin D, 25-hydroxy  3. Anxiety  Advised counseling   -We reviewed the PMH, PSH, FH, SH, Meds and Allergies. -We provided refills for any medications we will prescribe as needed. -We addressed current concerns per orders and patient instructions. -We have asked for records for pertinent exams, studies, vaccines and notes from previous providers. -We have advised patient to follow up per instructions below. -Pt refuses all vaccines  -Patient advised to return or notify a doctor immediately if symptoms worsen or persist or new concerns arise.  Patient Instructions  -We have ordered labs or studies at this visit. It can take up to 1-2 weeks for results and processing. We will contact you with instructions IF your results are abnormal. Normal results will be released to your [facility]. If you have not heard from [place] or can not find your results in [facility] in 2 weeks please contact our office.  -PLEASE SIGN UP FOR MYCHART TODAY   We recommend the following healthy lifestyle measures: - eat a healthy diet consisting of lots of vegetables, fruits, beans, nuts, seeds, healthy meats such as white chicken and fish and whole grains.  - avoid fried foods, fast food, processed foods, sodas, red meet and other fattening foods.  - get a least 150 minutes of aerobic exercise per week.   Follow up in: 1 month      Jemiah Cuadra,  Lilibeth Opie R.

## 2012-11-07 NOTE — Patient Instructions (Addendum)
-  We have ordered labs or studies at this visit. It can take up to 1-2 weeks for results and processing. We will contact you with instructions IF your results are abnormal. Normal results will be released to your MYCHART. If you have not heard from us or can not find your results in MYCHART in 2 weeks please contact our office.  -PLEASE SIGN UP FOR MYCHART TODAY   We recommend the following healthy lifestyle measures: - eat a healthy diet consisting of lots of vegetables, fruits, beans, nuts, seeds, healthy meats such as white chicken and fish and whole grains.  - avoid fried foods, fast food, processed foods, sodas, red meet and other fattening foods.  - get a least 150 minutes of aerobic exercise per week.   Follow up in: 1 month  

## 2012-11-08 LAB — VITAMIN D 25 HYDROXY (VIT D DEFICIENCY, FRACTURES): Vit D, 25-Hydroxy: 61 ng/mL (ref 30–89)

## 2012-11-09 LAB — POCT URINALYSIS DIPSTICK
Blood, UA: NEGATIVE
Leukocytes, UA: NEGATIVE
Nitrite, UA: NEGATIVE
Protein, UA: NEGATIVE
Urobilinogen, UA: 0.2
pH, UA: 6.5

## 2012-11-09 NOTE — Addendum Note (Signed)
Addended by: Azucena Freed on: 11/09/2012 08:26 AM   Modules accepted: Orders

## 2012-12-25 ENCOUNTER — Other Ambulatory Visit: Payer: Self-pay

## 2012-12-25 DIAGNOSIS — Z1231 Encounter for screening mammogram for malignant neoplasm of breast: Secondary | ICD-10-CM

## 2013-01-29 ENCOUNTER — Ambulatory Visit
Admission: RE | Admit: 2013-01-29 | Discharge: 2013-01-29 | Disposition: A | Discharge status: Home / Self Care | Payer: BC Managed Care – PPO | Source: Ambulatory Visit

## 2013-01-29 ENCOUNTER — Ambulatory Visit: Payer: BC Managed Care – PPO

## 2013-01-29 DIAGNOSIS — Z1231 Encounter for screening mammogram for malignant neoplasm of breast: Secondary | ICD-10-CM

## 2013-07-26 ENCOUNTER — Other Ambulatory Visit: Payer: Self-pay | Admitting: Family Medicine

## 2013-07-26 ENCOUNTER — Ambulatory Visit
Admission: RE | Admit: 2013-07-26 | Discharge: 2013-07-26 | Disposition: A | Discharge status: Home / Self Care | Payer: BC Managed Care – PPO | Source: Ambulatory Visit | Attending: Family Medicine | Admitting: Family Medicine

## 2013-07-26 DIAGNOSIS — R05 Cough: Secondary | ICD-10-CM

## 2013-08-30 ENCOUNTER — Other Ambulatory Visit: Payer: Self-pay

## 2013-09-28 ENCOUNTER — Other Ambulatory Visit: Payer: Self-pay | Admitting: Dermatology

## 2013-10-14 ENCOUNTER — Encounter: Payer: Self-pay | Admitting: Cardiology

## 2013-10-15 ENCOUNTER — Encounter: Payer: Self-pay | Admitting: General Surgery

## 2013-10-16 ENCOUNTER — Encounter: Payer: BC Managed Care – PPO | Admitting: Cardiology

## 2013-10-30 ENCOUNTER — Encounter: Payer: BC Managed Care – PPO | Admitting: Cardiology

## 2013-11-13 ENCOUNTER — Encounter: Payer: BC Managed Care – PPO | Admitting: Cardiology

## 2014-01-08 ENCOUNTER — Other Ambulatory Visit: Payer: Self-pay

## 2014-01-08 DIAGNOSIS — Z1231 Encounter for screening mammogram for malignant neoplasm of breast: Secondary | ICD-10-CM

## 2014-02-21 ENCOUNTER — Other Ambulatory Visit: Payer: Self-pay | Admitting: Dermatology

## 2014-05-23 ENCOUNTER — Encounter: Payer: Self-pay | Admitting: Gastroenterology

## 2014-05-23 ENCOUNTER — Encounter: Payer: Self-pay | Admitting: *Deleted

## 2014-05-28 ENCOUNTER — Encounter: Payer: Self-pay | Admitting: General Surgery

## 2014-06-20 ENCOUNTER — Ambulatory Visit: Payer: BC Managed Care – PPO

## 2014-06-20 ENCOUNTER — Ambulatory Visit (INDEPENDENT_AMBULATORY_CARE_PROVIDER_SITE_OTHER): Payer: BC Managed Care – PPO | Admitting: Cardiology

## 2014-06-20 ENCOUNTER — Encounter: Payer: Self-pay | Admitting: Cardiology

## 2014-06-20 VITALS — BP 112/60 | HR 62 | Ht 72.0 in | Wt 164.8 lb

## 2014-06-20 DIAGNOSIS — R079 Chest pain, unspecified: Secondary | ICD-10-CM

## 2014-06-20 DIAGNOSIS — R002 Palpitations: Secondary | ICD-10-CM | POA: Insufficient documentation

## 2014-06-20 DIAGNOSIS — I493 Ventricular premature depolarization: Secondary | ICD-10-CM

## 2014-06-20 DIAGNOSIS — I4949 Other premature depolarization: Secondary | ICD-10-CM

## 2014-06-20 NOTE — Progress Notes (Signed)
[place], Autumn Foster, [place]  [address] Phone: [phone] Fax:  [phone]  Date:  06/20/2014   ID:  Autumn Foster, Autumn Foster Feb 22, 1966, MRN [id number]  PCP:  Lucretia Kern., DO  Cardiologist:  Fransico Him, MD     History of Present Illness: Autumn Foster is a 48 y.o. female with a history of PVC's who presents toady for evaluation of palpitations.  She has a history of anxiety and is very concerned that her great grandparents died of heart disease.  She is concerned that her HR runs in the 170's when she exercises.  She used to run 5K's but they made her feel bad so she stopped.  She says that the palpitations occur daily.  She drinks one cup of coffee in the am otherwise no caffeine.  She occasionally has wine and vodka.  She says that occasionally when she is walking she will get some pressure in her chest.  She denies any SOB except with extreme exercise.  She denies any LE edema or syncope but occasionally will have some dizziness when going from sitting to standing.   Wt Readings from Last 3 Encounters:  11/07/12 155 lb (70.308 kg)  05/26/12 152 lb (68.947 kg)     Past Medical History  Diagnosis Date  . History of chicken pox   . Yeast infection   . Bacterial infection   . Herpes   . Urinary frequency   . Anemia   . Osteopenia   . Candida vaginitis   . Ovarian cyst   . Anxiety   . Menorrhagia   . Endometrial polyp   . ASCUS on Pap smear   . PMDD (premenstrual dysphoric disorder)   . Bladder spasm   . Dysmenorrhea   . HPV (human papilloma virus) infection   . Rectal bleed   . Cervical dysplasia   . Blood in stool   . Depression   . Malignancy   . Genital warts   . Urine incontinence   . Colon polyp     Current Outpatient Prescriptions  Medication Sig Dispense Refill  . AMBULATORY NON FORMULARY MEDICATION Apply 0.5 cm topically as needed. Medication Name: Yell Gel apply small amount to clitoral area 30 minutes before intercourse  10 g  6  .  Ascorbic Acid (VITAMIN C) 100 MG tablet Take 100 mg by mouth daily.      . ASTAXANTHIN PO Take by mouth.      . B Complex Vitamins (B COMPLEX 1 PO) Take by mouth.      . cholecalciferol (VITAMIN D) 1000 UNITS tablet Take 1,000 Units by mouth daily.      . fish oil-omega-3 fatty acids 1000 MG capsule Take 2 g by mouth daily.      Marland Kitchen MAGNESIUM PO Take by mouth.      . NONFORMULARY OR COMPOUNDED ITEM Testosterone Compound      . Turmeric, Curcuma Longa, (CURCUMIN) POWD by Does not apply route.      . valACYclovir (VALTREX) 500 MG tablet Take 500 mg by mouth 2 (two) times daily.       No current facility-administered medications for this visit.    Allergies:    Allergies  Allergen Reactions  . Doxycycline     Fatigue  . Lexapro [Escitalopram Oxalate]   . Penicillins     Childhood rash  . Pristiq [Desvenlafaxine]     Dry heaves/dizzy  . Prozac [Fluoxetine Hcl]     HA  and Strange Dreams  . Septra Ds [Sulfamethoxazole-Tmp Ds]     Jitteriness/hyper/affecting sleep    Social History:  The patient  reports that she has never smoked. She has never used smokeless tobacco. She reports that she drinks alcohol. She reports that she does not use illicit drugs.   Family History:  The patient's family history includes Cancer in her father; Diabetes in her maternal grandfather; Hypertension in her father; Migraines in her mother; Thyroid disease in her maternal grandmother.   ROS:  Please see the history of present illness.      All other systems reviewed and negative.   PHYSICAL EXAM: VS:  There were no vitals taken for this visit. Well nourished, well developed, in no acute distress HEENT: normal Neck: no JVD Cardiac:  normal S1, S2; RRR; no murmur Lungs:  clear to auscultation bilaterally, no wheezing, rhonchi or rales Abd: soft, nontender, no hepatomegaly Ext: no edema Skin: warm and dry Neuro:  CNs 2-12 intact, no focal abnormalities noted  EKG:     NSR with no ST  changes  ASSESSMENT AND PLAN:  1. Palpitations - 30 day event  Monitor to assess for arrhythmias other than PVC's 2. PVC's 3. Exertional chest pain - ETT  Followup with me PRN pending results of studies  Signed, Fransico Him, MD 06/20/2014 9:49 AM

## 2014-06-20 NOTE — Patient Instructions (Signed)
Your physician recommends that you continue on your current medications as directed. Please refer to the Current Medication list given to you today.  Your physician has recommended that you wear an event monitor. Event monitors are medical devices that record the heart's electrical activity. Doctors most often [place] these monitors to diagnose arrhythmias. Arrhythmias are problems with the speed or rhythm of the heartbeat. The monitor is a small, portable device. You can wear one while you do your normal daily activities. This is usually used to diagnose what is causing palpitations/syncope (passing out).  Cardiac Event Monitoring A cardiac event monitor is a small recording device used to help detect abnormal heart rhythms (arrhythmias). The monitor is used to record heart rhythm when noticeable symptoms such as the following occur:  Fast heartbeats (palpitations), such as heart racing or fluttering.  Dizziness.  Fainting or light-headedness.  Unexplained weakness. The monitor is wired to two electrodes placed on your chest. Electrodes are flat, sticky disks that attach to your skin. The monitor can be worn for up to 30 days. You will wear the monitor at all times, except when bathing.  HOW TO USE YOUR CARDIAC EVENT MONITOR A technician will prepare your chest for the electrode placement. The technician will show you how to place the electrodes, how to work the monitor, and how to replace the batteries. Take time to practice using the monitor before you leave the office. Make sure you understand how to send the information from the monitor to your health care provider. This requires a telephone with a landline, not a cell phone. You need to:  Wear your monitor at all times, except when you are in water:  Do not get the monitor wet.  Take the monitor off when bathing. Do not swim or use a hot tub with it on.  Keep your skin clean. Do not put body lotion or moisturizer on your chest.  Change the  electrodes daily or any time they stop sticking to your skin. You might need to use tape to keep them on.  It is possible that your skin under the electrodes could become irritated. To keep this from happening, try to put the electrodes in slightly different places on your chest. However, they must remain in the area under your left breast and in the upper right section of your chest.  Make sure the monitor is safely clipped to your clothing or in a location close to your body that your health care provider recommends.  Press the button to record when you feel symptoms of heart trouble, such as dizziness, weakness, light-headedness, palpitations, thumping, shortness of breath, unexplained weakness, or a fluttering or racing heart. The monitor is always on and records what happened slightly before you pressed the button, so do not worry about being too late to get good information.  Keep a diary of your activities, such as walking, doing chores, and taking medicine. It is especially important to note what you were doing when you pushed the button to record your symptoms. This will help your health care provider determine what might be contributing to your symptoms. The information stored in your monitor will be reviewed by your health care provider alongside your diary entries.  Send the recorded information as recommended by your health care provider. It is important to understand that it will take some time for your health care provider to process the results.  Change the batteries as recommended by your health care provider. [place]  IF:   You have chest pain.  You have extreme difficulty breathing or shortness of breath.  You develop a very fast heartbeat that persists.  You develop dizziness that does not go away.  You faint or constantly feel you are about to faint. Document Released: 07/20/2008 Document Revised: 02/25/2014 Document Reviewed: 04/09/2013 [facility]  Patient Information 2015 [place], [place]. This information is not intended to replace advice given to you by your health care provider. Make sure you discuss any questions you have with your health care provider.   Your physician has requested that you have an exercise tolerance test. For further information please visit [email or web address]. Please also follow instruction sheet, as given.  Exercise Stress Electrocardiogram An exercise stress electrocardiogram is a test that is done to evaluate the blood supply to your heart. This test may also be called exercise stress electrocardiography. The test is done while you are walking on a treadmill. The goal of this test is to raise your heart rate. This test is done to find areas of poor blood flow to the heart by determining the extent of coronary artery disease (CAD).   CAD is defined as narrowing in one or more heart (coronary) arteries of more than 70%. If you have an abnormal test result, this may mean that you are not getting adequate blood flow to your heart during exercise. Additional testing may be needed to understand why your test was abnormal. LET [facility] CARE PROVIDER KNOW ABOUT:   Any allergies you have.  All medicines you are taking, including vitamins, herbs, eye drops, creams, and over-the-counter medicines.  Previous problems you or members of your family have had with the use of anesthetics.  Any blood disorders you have.  Previous surgeries you have had.  Medical conditions you have.  Possibility of pregnancy, if this applies. RISKS AND COMPLICATIONS Generally, this is a safe procedure. However, as with any procedure, complications can occur. Possible complications can include:  Pain or pressure in the following areas:  Chest.  Jaw or neck.  Between your shoulder blades.  Radiating down your left arm.  Dizziness or light-headedness.  Shortness of breath.  Increased or irregular heartbeats.  Nausea or  vomiting.  Heart attack (rare). BEFORE THE PROCEDURE  Avoid all forms of caffeine 24 hours before your test or as directed by your health care provider. This includes coffee, tea (even decaffeinated tea), caffeinated sodas, chocolate, cocoa, and certain pain medicines.  Follow your health care provider's instructions regarding eating and drinking before the test.  Take your medicines as directed at regular times with water unless instructed otherwise. Exceptions may include:  If you have diabetes, ask how you are to take your insulin or pills. It is common to adjust insulin dosing the morning of the test.  If you are taking beta-blocker medicines, it is important to talk to your health care provider about these medicines well before the date of your test. Taking beta-blocker medicines may interfere with the test. In some cases, these medicines need to be changed or stopped 24 hours or more before the test.  If you wear a nitroglycerin patch, it may need to be removed prior to the test. Ask your health care provider if the patch should be removed before the test.  If you use an inhaler for any breathing condition, bring it with you to the test.  If you are an outpatient, bring a snack so you can eat right after the stress phase of the test.  Do not smoke for 4 hours prior to the test or as directed by your health care provider.  Do not apply lotions, powders, creams, or oils on your chest prior to the test.  Wear loose-fitting clothes and comfortable shoes for the test. This test involves walking on a treadmill. PROCEDURE  Multiple patches (electrodes) will be put on your chest. If needed, small areas of your chest may have to be shaved to get better contact with the electrodes. Once the electrodes are attached to your body, multiple wires will be attached to the electrodes and your heart rate will be monitored.  Your heart will be monitored both at rest and while exercising.  You will  walk on a treadmill. The treadmill will be started at a slow pace. The treadmill speed and incline will gradually be increased to raise your heart rate. AFTER THE PROCEDURE  Your heart rate and blood pressure will be monitored after the test.  You may return to your normal schedule including diet, activities, and medicines, unless your health care provider tells you otherwise. Document Released: 10/08/2000 Document Revised: 10/16/2013 Document Reviewed: 06/18/2013 [facility] Patient Information 2015 [place], [place]. This information is not intended to replace advice given to you by your health care provider. Make sure you discuss any questions you have with your health care provider.  Your physician recommends that you schedule a follow-up appointment as needed with Dr Radford Pax

## 2014-06-21 ENCOUNTER — Encounter: Payer: Self-pay | Admitting: Radiology

## 2014-06-21 ENCOUNTER — Encounter (INDEPENDENT_AMBULATORY_CARE_PROVIDER_SITE_OTHER): Payer: BC Managed Care – PPO

## 2014-06-21 ENCOUNTER — Encounter: Payer: BC Managed Care – PPO | Admitting: Cardiology

## 2014-06-21 DIAGNOSIS — R002 Palpitations: Secondary | ICD-10-CM

## 2014-06-21 NOTE — Progress Notes (Signed)
Patient ID: Autumn Foster, female   DOB: [birth year], 48 y.o.   MRN: [id number] [employer] 30 day monitor applied. EOS 07-21-14

## 2014-06-28 ENCOUNTER — Ambulatory Visit
Admission: RE | Admit: 2014-06-28 | Discharge: 2014-06-28 | Disposition: A | Discharge status: Home / Self Care | Payer: BC Managed Care – PPO | Source: Ambulatory Visit

## 2014-06-28 DIAGNOSIS — Z1231 Encounter for screening mammogram for malignant neoplasm of breast: Secondary | ICD-10-CM

## 2014-07-22 ENCOUNTER — Ambulatory Visit (INDEPENDENT_AMBULATORY_CARE_PROVIDER_SITE_OTHER): Payer: BC Managed Care – PPO | Admitting: Physician Assistant

## 2014-07-22 DIAGNOSIS — R079 Chest pain, unspecified: Secondary | ICD-10-CM

## 2014-07-22 NOTE — Progress Notes (Signed)
Exercise Treadmill Test  Pre-Exercise Testing Evaluation Rhythm: sinus bradycardia  Rate: 59     Test  Exercise Tolerance Test Ordering MD: Fransico Him, MD  Interpreting MD: Richardson Dopp, PA-C  Unique Test No: 1  Treadmill:  1  Indication for ETT: chest pain - rule out ischemia  Contraindication to ETT: No   Stress Modality: exercise - treadmill  Cardiac Imaging Performed: non   Protocol: standard Bruce - maximal  Max BP:  168/69  Max MPHR (bpm): 171 85% MPR (bpm):  145  MPHR obtained (bpm):  162 % MPHR obtained:  95  Reached 85% MPHR (min:sec):  5:49 Total Exercise Time (min-sec):  9:00  Workload in METS:  10.1 Borg Scale: 16  Reason ETT Terminated:  desired heart rate attained    ST Segment Analysis At Rest: normal ST segments - no evidence of significant ST depression With Exercise: no evidence of significant ST depression  Other Information Arrhythmia:  No Angina during ETT:  absent (0) Quality of ETT:  diagnostic  ETT Interpretation:  normal - no evidence of ischemia by ST analysis  Comments: Good exercise capacity. No chest pain. Normal BP response to exercise. No ST changes to suggest ischemia.   Recommendations: FU with Dr. Fransico Him as directed. Signed, Richardson Dopp, PA-C   07/22/2014 11:13 AM

## 2014-07-24 ENCOUNTER — Telehealth: Payer: Self-pay | Admitting: Cardiology

## 2014-07-24 NOTE — Telephone Encounter (Signed)
Please let patient know that heart monitor showed NSR and sinus tachycardia up to 174bpm.  Please find out if patient was exercising on 8/29 at 9:45am to 10am.  She had an occasional PVC which are benign

## 2014-07-25 NOTE — Telephone Encounter (Signed)
Pt is aware. She stated that is the time she usually works out in the morning. She stated she would follow up with her PCP bc of her anxiety.

## 2014-08-26 ENCOUNTER — Encounter: Payer: Self-pay | Admitting: Cardiology

## 2014-11-27 ENCOUNTER — Other Ambulatory Visit: Payer: Self-pay | Admitting: Family Medicine

## 2014-11-27 DIAGNOSIS — R1013 Epigastric pain: Secondary | ICD-10-CM

## 2014-12-04 ENCOUNTER — Encounter: Payer: Self-pay | Admitting: Nurse Practitioner

## 2014-12-09 ENCOUNTER — Encounter: Payer: Self-pay | Admitting: Gastroenterology

## 2014-12-12 ENCOUNTER — Encounter: Payer: Self-pay | Admitting: Nurse Practitioner

## 2014-12-12 ENCOUNTER — Ambulatory Visit (INDEPENDENT_AMBULATORY_CARE_PROVIDER_SITE_OTHER): Payer: BLUE CROSS/BLUE SHIELD | Admitting: Nurse Practitioner

## 2014-12-12 DIAGNOSIS — R1012 Left upper quadrant pain: Secondary | ICD-10-CM

## 2014-12-12 NOTE — Progress Notes (Signed)
HPI :  Patient is a 49 year old female known to Dr. Fuller Plan. She has a history of adenomatous colon polyps March 2012. She is due for surveillance colonoscopy March 2017. Patient referred by PCP for evaluation of abdominal pain.   Patient was exercising on her elliptical machine a few weeks ago when she subsequently developed some muscular-type chest pain. Patient exercises on a regular basis but was trying a new exercise at the time. Chest pain eventually resolved but then patient noticed presence of some upper abdominal pain. The upper abdominal pain is not related to eating, it does get worse with exercise but also occurs when completely still. The pain is most noticeable at night and actually wakes her up from sleep. No vomiting. No significant weight loss. Recent comprehensive metabolic profile by primary care physician was normal. H. pylori negative. Ultrasound of the right upper quadrant was normal.   Past Medical History  Diagnosis Date  . History of chicken pox   . Yeast infection   . Herpes   . Anemia   . Osteopenia   . Candida vaginitis   . Ovarian cyst   . Anxiety   . Menorrhagia   . Endometrial polyp   . ASCUS on Pap smear   . PMDD (premenstrual dysphoric disorder)   . Bladder spasm   . Dysmenorrhea   . HPV (human papilloma virus) infection   . Cervical dysplasia   . Depression   . Genital warts   . Urine incontinence   . Colon polyp     tubular adenoma  . Hemorrhoids     Family History  Problem Relation Age of Onset  . Thyroid disease Maternal Grandmother   . Prostate cancer Father   . Hypertension Father   . Diabetes Maternal Grandfather   . Migraines Mother   . Renal cancer Father   . Alcohol abuse Mother   . Suicidality Mother     deceased  . Colon cancer Paternal Grandfather   . Dementia Paternal Grandmother   . Rheum arthritis Maternal Grandmother   . Anxiety disorder Sister     x 2  . Diabetes Sister     prediabeties   History  Substance Use  Topics  . Smoking status: Never Smoker   . Smokeless tobacco: Never Used  . Alcohol Use: Yes     Comment: socially    Current Outpatient Prescriptions  Medication Sig Dispense Refill  . AMBULATORY NON FORMULARY MEDICATION Apply 0.5 cm topically as needed. Medication Name: Yell Gel apply small amount to clitoral area 30 minutes before intercourse 10 g 6  . Ascorbic Acid (VITAMIN C) 1000 MG tablet Take 1,000 mg by mouth daily.    . cholecalciferol (VITAMIN D) 1000 UNITS tablet Take 4,000 Units by mouth daily.     . Coenzyme Q10 (CO Q 10 PO) Take 200 mg by mouth as directed.    . fish oil-omega-3 fatty acids 1000 MG capsule Take 2 g by mouth daily.    Marland Kitchen LORazepam (ATIVAN) 0.5 MG tablet Take 0.5 mg by mouth as needed (for anxiety).     Marland Kitchen MAGNESIUM PO Take by mouth.    . Probiotic Product (PROBIOTIC PO) Take by mouth daily.    . progesterone (PROMETRIUM) 100 MG capsule Take 100 mg by mouth daily.    . Turmeric, Curcuma Longa, (CURCUMIN) POWD by Does not apply route.    . valACYclovir (VALTREX) 500 MG tablet Take 500 mg by mouth 2 (two) times daily.  No current facility-administered medications for this visit.   Allergies  Allergen Reactions  . Doxycycline     Fatigue  . Lexapro [Escitalopram Oxalate]   . Pristiq [Desvenlafaxine]     Dry heaves/dizzy  . Prozac [Fluoxetine Hcl]     HA and Strange Dreams  . Septra Ds [Sulfamethoxazole-Trimethoprim]     Jitteriness/hyper/affecting sleep  . Penicillins Rash    Childhood rash     Review of Systems: All systems reviewed and negative except where noted in HPI.   Physical Exam: BP 112/80 mmHg  Ht 6' (1.829 m)  Wt 160 lb 9.6 oz (72.848 kg)  BMI 21.78 kg/m2 Constitutional: Pleasant,well-developed, white female in no acute distress. HEENT: Normocephalic and atraumatic. Conjunctivae are normal. No scleral icterus. Neck supple.  Cardiovascular: Normal rate, regular rhythm.  Pulmonary/chest: Effort normal and breath sounds normal. No  wheezing, rales or rhonchi. Abdominal: Soft, nondistended, mild epigastric tenderness. Bowel sounds active throughout. There are no masses palpable. No hepatomegaly. Extremities: no edema Lymphadenopathy: No cervical adenopathy noted. Neurological: Alert and oriented to person place and time. Skin: Skin is warm and dry. No rashes noted. Psychiatric: Normal mood and affect. Behavior is normal.   ASSESSMENT AND PLAN:  Pleasant 49 year old female referred by PCP for evaluation of epigastric pain. Labs and ultrasound normal. Pain is not related to eating , it is aggravated by exercise but also hurts when completely still. In fact, pain is waking her up at night. Etiology of pain not clear cut, it has some characteristics of musculoskeletal pain but its ability to wake her up at night is a little concerning.   We disussed diagnostic options. Patient really worried, she wants to proceed with a CTscan which I don't think this is unreasonable. If negative an epigastric pain persists then proceed with EGD. In the interim I think PPI therapy is worth a try.  I symptoms improve on PPI then we can hold off on above studies.

## 2014-12-12 NOTE — Patient Instructions (Addendum)
You have been scheduled for a CT scan of the abdomen and pelvis at [facility] ([address]---this is in the same building as [facility]).   You are scheduled on 12-17-14  At 10:00am. You should arrive 15 minutes prior to your appointment time for registration. Please follow the written instructions below on the day of your exam:  WARNING: IF YOU ARE ALLERGIC TO IODINE/X-RAY DYE, PLEASE NOTIFY RADIOLOGY IMMEDIATELY AT [phone]! YOU WILL BE GIVEN A 13 HOUR PREMEDICATION PREP.  1) Do not eat or drink anything after 6:00am (4 hours prior to your test) 2) You have been given 2 bottles of oral contrast to drink. The solution may taste               better if refrigerated, but do NOT add ice or any other liquid to this solution. Shake             well before drinking.    Drink 1 bottle of contrast @ 8:00am (2 hours prior to your exam)  Drink 1 bottle of contrast @ 9:00am (1 hour prior to your exam)  You may take any medications as prescribed with a small amount of water except for the following: Metformin, Glucophage, Glucovance, Avandamet, Riomet, Fortamet, Actoplus Met, Janumet, Glumetza or Metaglip. The above medications must be held the day of the exam AND 48 hours after the exam.  The purpose of you drinking the oral contrast is to aid in the visualization of your intestinal tract. The contrast solution may cause some diarrhea. Before your exam is started, you will be given a small amount of fluid to drink. Depending on your individual set of symptoms, you may also receive an intravenous injection of x-ray contrast/dye. Plan on being at [facility] for 30 minutes or long, depending on the type of exam you are having performed.  This test typically takes 30-45 minutes to complete.  If you have any questions regarding your exam or if you need to reschedule, you may call the CT department at [phone] between the hours of 8:00 am and 5:00 pm,  Monday-Friday.  ________________________________________________________________________  Autumn Foster have been scheduled for an endoscopy. Please follow written instructions given to you at your visit today. If you use inhalers (even only as needed), please bring them with you on the day of your procedure. Your physician has requested that you go to www.startemmi.com and enter the access code given to you at your visit today. This web site gives a general overview about your procedure. However, you should still follow specific instructions given to you by our office regarding your preparation for the procedure.  Cc: Carol Ada

## 2014-12-13 ENCOUNTER — Telehealth: Payer: Self-pay | Admitting: Nurse Practitioner

## 2014-12-13 ENCOUNTER — Encounter: Payer: Self-pay | Admitting: Nurse Practitioner

## 2014-12-13 NOTE — Telephone Encounter (Signed)
Discussed the reason for ordering a CT vs. MRI. I assured her it is minimal radiation exposure and is safe.  She verbalized understanding and agrees to proceed with the CT.  She will call back for any additional questions or concerns

## 2014-12-13 NOTE — Progress Notes (Signed)
Reviewed and agree with management plan.  Ransom Nickson T. Lavontae Cornia, MD FACG 

## 2014-12-17 ENCOUNTER — Ambulatory Visit (INDEPENDENT_AMBULATORY_CARE_PROVIDER_SITE_OTHER)
Admission: RE | Admit: 2014-12-17 | Discharge: 2014-12-17 | Disposition: A | Discharge status: Home / Self Care | Payer: BLUE CROSS/BLUE SHIELD | Source: Ambulatory Visit | Attending: Nurse Practitioner | Admitting: Nurse Practitioner

## 2014-12-17 DIAGNOSIS — R1012 Left upper quadrant pain: Secondary | ICD-10-CM

## 2014-12-17 MED ORDER — IOHEXOL 300 MG/ML  SOLN
100.0000 mL | Freq: Once | INTRAMUSCULAR | Status: AC | PRN
  Administered 2014-12-17: 100 mL via INTRAVENOUS

## 2014-12-18 NOTE — Progress Notes (Signed)
No need to change PPI. If zero improvement on one then another not likely to help. Okay, knew we talked about an EGD, glad she is on the schedule for it. I am leaning towards musculoskeletal pain but we will know more after EGD. Thanks

## 2014-12-30 ENCOUNTER — Encounter: Payer: BLUE CROSS/BLUE SHIELD | Admitting: Gastroenterology

## 2015-01-13 ENCOUNTER — Encounter: Payer: Self-pay | Admitting: Nurse Practitioner

## 2015-01-13 ENCOUNTER — Encounter: Payer: Self-pay | Admitting: Gastroenterology

## 2015-01-13 NOTE — Telephone Encounter (Signed)
Error

## 2015-02-17 ENCOUNTER — Encounter: Payer: BLUE CROSS/BLUE SHIELD | Admitting: Gastroenterology

## 2015-12-04 ENCOUNTER — Encounter: Payer: Self-pay | Admitting: Gastroenterology

## 2015-12-10 ENCOUNTER — Encounter: Payer: Self-pay | Admitting: Gastroenterology

## 2016-02-07 IMAGING — MG MM SCREEN MAMMOGRAM BILATERAL
3 series · 3 of 3 positions shown · non-contrast
Comparison: Previous exam(s).

CLINICAL DATA: Screening.

EXAM:
DIGITAL SCREENING BILATERAL MAMMOGRAM WITH CAD

[R CC]
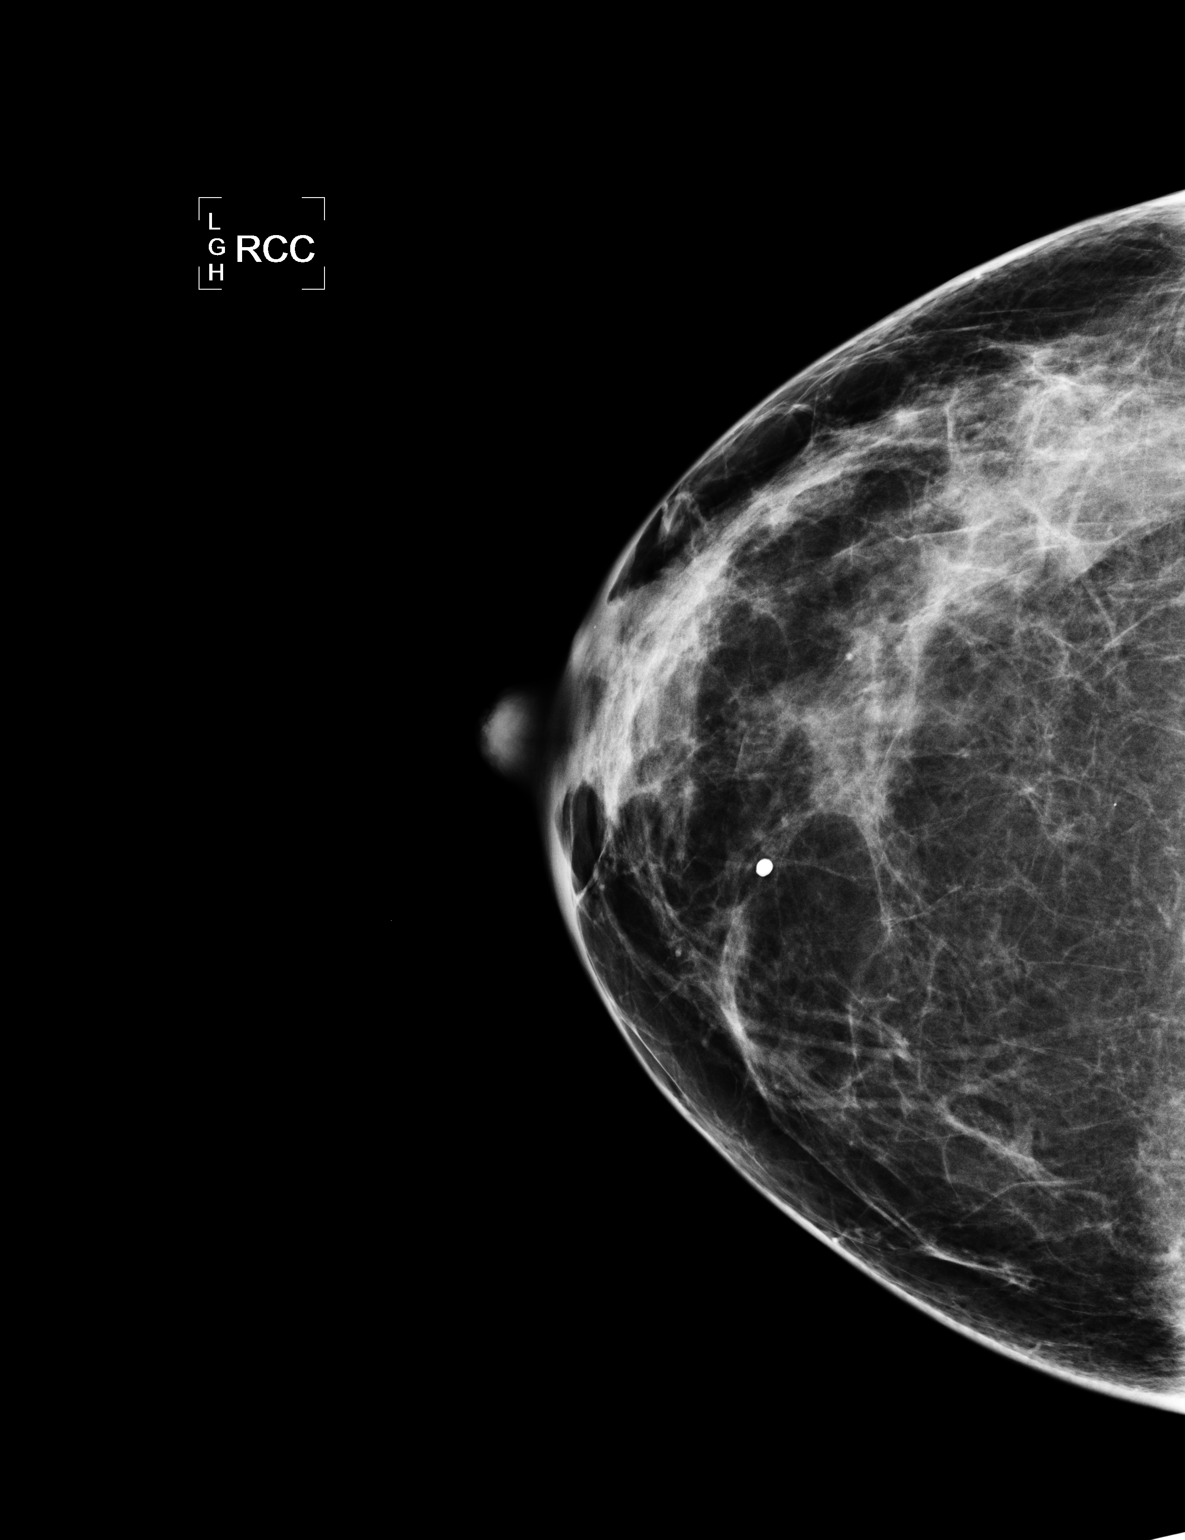

[L CC]
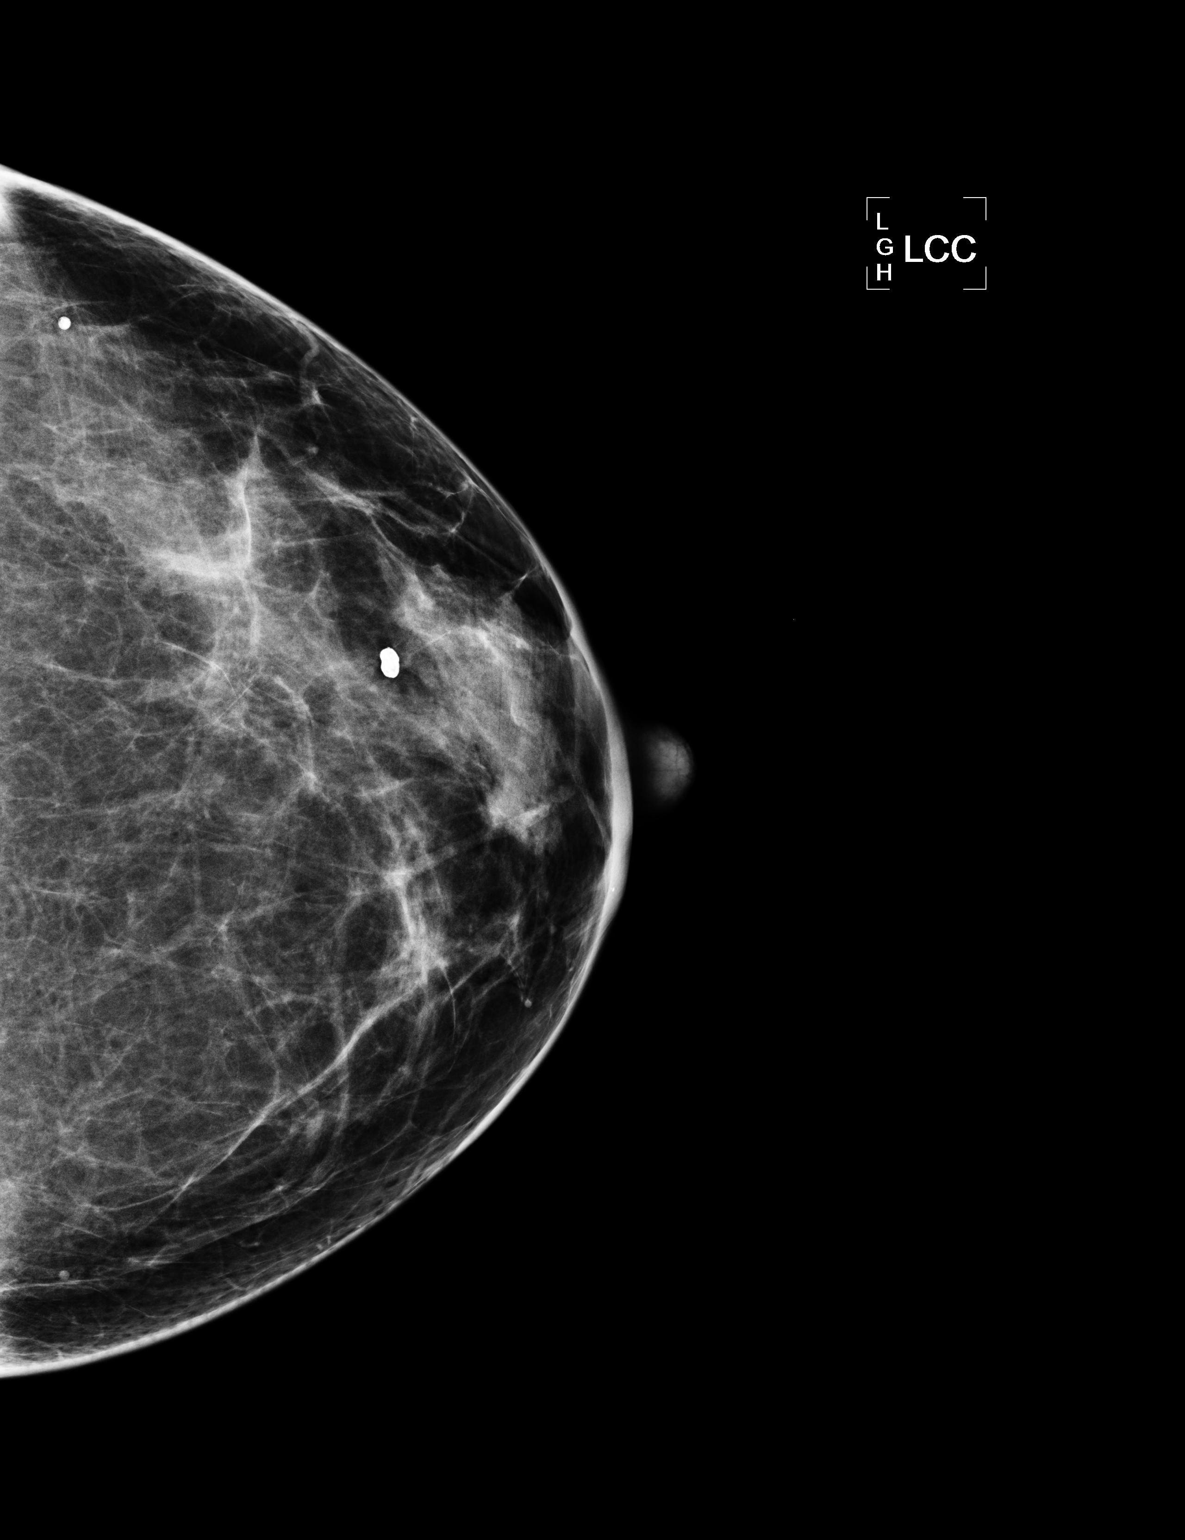

[L MLO]
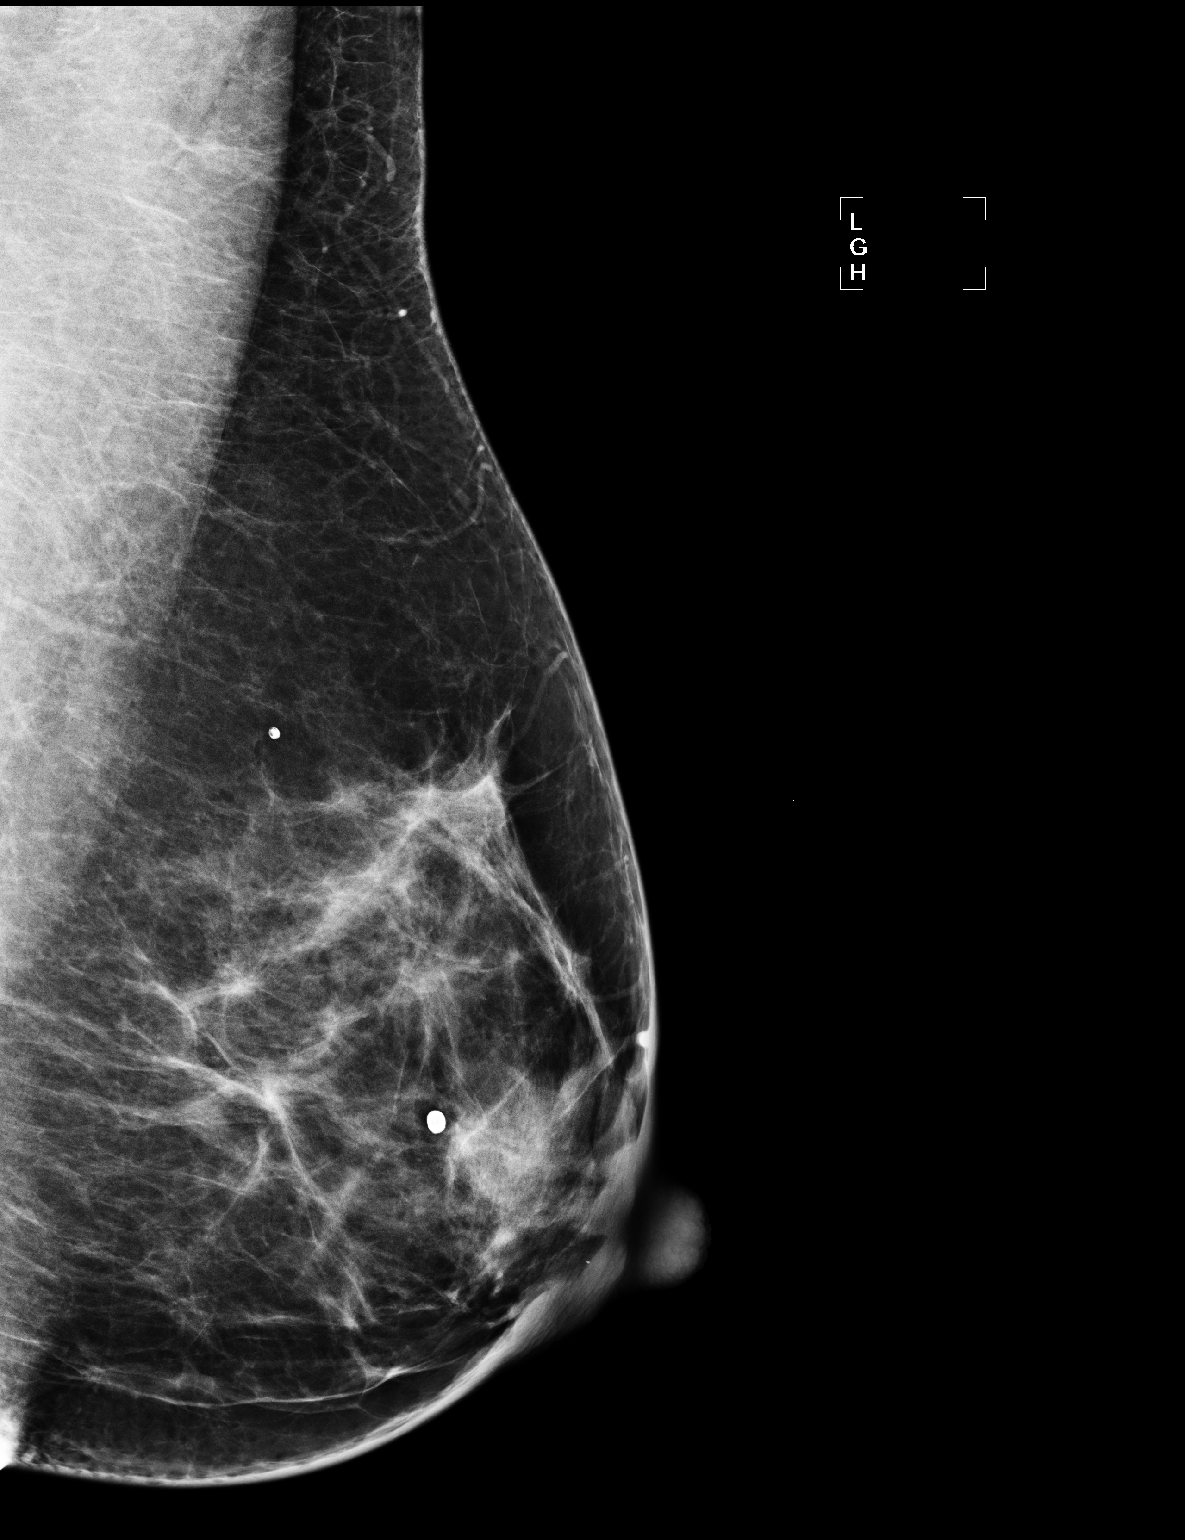

[3 of 3 positions shown; findings below may reference images not displayed]

ACR Breast Density Category b: There are scattered areas of
fibroglandular density.
FINDINGS: There are no findings suspicious for malignancy. Images were
processed with CAD.
IMPRESSION: No mammographic evidence of malignancy. A result letter of this
screening mammogram will be mailed directly to the patient.

RECOMMENDATION:
Screening mammogram in one year. (Code:AS-G-LCT)

BI-RADS CATEGORY  1: Negative.

## 2016-02-18 ENCOUNTER — Encounter: Payer: BLUE CROSS/BLUE SHIELD | Admitting: Gastroenterology

## 2016-03-23 ENCOUNTER — Encounter: Payer: Self-pay | Admitting: Cardiology

## 2016-03-23 ENCOUNTER — Ambulatory Visit (INDEPENDENT_AMBULATORY_CARE_PROVIDER_SITE_OTHER): Payer: BLUE CROSS/BLUE SHIELD | Admitting: Cardiology

## 2016-03-23 VITALS — BP 103/73 | HR 62 | Ht 72.0 in | Wt 153.0 lb

## 2016-03-23 DIAGNOSIS — R002 Palpitations: Secondary | ICD-10-CM | POA: Diagnosis not present

## 2016-03-23 DIAGNOSIS — I951 Orthostatic hypotension: Secondary | ICD-10-CM | POA: Diagnosis not present

## 2016-03-23 NOTE — Progress Notes (Signed)
Cardiology Office Note   Date:  03/23/2016   ID:  SHANKIA AUSTELL, DOB [birth year], MRN [id number]  PCP:  Reginia Naas, MD  Cardiologist:  Dr. Radford Pax     Chief Complaint  Patient presents with  . Palpitations    with hypotension       History of Present Illness: Autumn Foster is a 50 y.o. female who presents for hypotension and dizziness post exercise.  PCP was concerned for POTS. She feels disoriented or BP as low as 91/69.  She is weaning off bioidentical hormones.   She has a hx of ST with exercise at 174, also occ PVCs that were benign. With ETT her BP remained elevated in 2015..    Today she relates her feeling cloudy and "walking through soup" occurs after exercise.  She is off her hormones now except for some testosterone cream.  She drinks a large cup of coffee daily and maybe 40 oz water.  Once in shower she denies it was very hot, felt so weak she though she might pass out.  Her husband had to help her to the bed.    Today she is orthostatic.  She denies chest pain or SOB.      Past Medical History  Diagnosis Date  . History of chicken pox   . Yeast infection   . Bacterial infection   . Herpes   . Urinary frequency   . Anemia   . Osteopenia   . Candida vaginitis   . Ovarian cyst   . Anxiety   . Menorrhagia   . Endometrial polyp   . ASCUS on Pap smear   . PMDD (premenstrual dysphoric disorder)   . Bladder spasm   . Dysmenorrhea   . HPV (human papilloma virus) infection   . Rectal bleed   . Cervical dysplasia   . Blood in stool   . Depression   . Malignancy ([place])   . Genital warts   . Urine incontinence   . Colon polyp     tubular adenoma  . Hemorrhoids     Past Surgical History  Procedure Laterality Date  . Hysteroscopy    . Dilation and curettage of uterus    . Colonoscopy  12/29/2010    2 sessile polyps Dr. Fuller Plan     Current Outpatient Prescriptions  Medication Sig Dispense Refill  . AMBULATORY NON FORMULARY MEDICATION Apply 0.5  cm topically as needed. Medication Name: Yell Gel apply small amount to clitoral area 30 minutes before intercourse 10 g 6  . Ascorbic Acid (VITAMIN C) 1000 MG tablet Take 1,000 mg by mouth daily.    . cholecalciferol (VITAMIN D) 1000 UNITS tablet Take 4,000 Units by mouth daily.     . Coenzyme Q10 (CO Q 10 PO) Take 200 mg by mouth as directed.    . fish oil-omega-3 fatty acids 1000 MG capsule Take 2 g by mouth daily.    Marland Kitchen MAGNESIUM PO Take 1 tablet by mouth daily.     . Probiotic Product (ACIDOPHILUS PROBIOTIC BLEND) CAPS Take 1 capsule by mouth daily.    . valACYclovir (VALTREX) 500 MG tablet Take 500 mg by mouth 2 (two) times daily.     No current facility-administered medications for this visit.    Allergies:   Doxycycline; Lexapro; Pristiq; Prozac; Septra ds; and Penicillins    Social History:  The patient  reports that she has never smoked. She has never used smokeless tobacco. She reports that she drinks alcohol.  She reports that she does not use illicit drugs.   Family History:  The patient's family history includes Alcohol abuse in her mother; Anxiety disorder in her sister; Colon cancer in her paternal grandfather; Dementia in her paternal grandmother; Diabetes in her maternal grandfather and sister; Hypertension in her father; Migraines in her mother; Prostate cancer in her father; Renal cancer in her father; Rheum arthritis in her maternal grandmother; Suicidality in her mother; Thyroid disease in her maternal grandmother.    ROS:  General:no colds or fevers, no weight changes Skin:no rashes or ulcers HEENT:no blurred vision, no congestion CV:see HPI PUL:see HPI GI:no diarrhea constipation or melena, no indigestion GU:no hematuria, no dysuria MS:no joint pain, no claudication Neuro:no syncope, no lightheadedness Endo:no diabetes, no thyroid disease  Wt Readings from Last 3 Encounters:  03/23/16 153 lb (69.4 kg)  12/12/14 160 lb 9.6 oz (72.848 kg)  06/20/14 164 lb 12.8 oz  (74.753 kg)     PHYSICAL EXAM: VS:  BP 103/73 mmHg  Pulse 62  Ht 6' (1.829 m)  Wt 153 lb (69.4 kg)  BMI 20.75 kg/m2 , BMI Body mass index is 20.75 kg/(m^2). General:Pleasant affect, NAD Skin:Warm and dry, brisk capillary refill HEENT:normocephalic, sclera clear, mucus membranes moist Neck:supple, no JVD, no bruits  Heart:S1S2 RRR without murmur, gallup, rub or click Lungs:clear without rales, rhonchi, or wheezes [id number], non tender, + BS, do not palpate liver spleen or masses Ext:no lower ext edema, 2+ pedal pulses, 2+ radial pulses Neuro:alert and oriented, MAE, follows commands, + facial symmetry    EKG:  EKG is ordered today. The ekg ordered today demonstrates  SR with PVCs no changes from previous.    Recent Labs: No results found for requested labs within last 365 days.    Lipid Panel    Component Value Date/Time   CHOL 196 11/07/2012 0915   TRIG 31.0 11/07/2012 0915   HDL 91.80 11/07/2012 0915   CHOLHDL 2 11/07/2012 0915   VLDL 6.2 11/07/2012 0915   LDLCALC 98 11/07/2012 0915       Other studies Reviewed: Additional studies/ records that were reviewed today include: previous ETT with normal BP.   ASSESSMENT AND PLAN:  1.  Hypotensive - orthostatic. Discussed with Dr. Radford Pax, support stockings would be helpful but with summer will hold off for now.  It is difficult to decide if hormonal or POTs..  for now encouraged to decrease coffee and increase water and drink Gatorade daily. If this does not help will add florinef.  She will follow up in 2 weeks.  2. Hx PVCs.     Current medicines are reviewed with the patient today.  The patient Has no concerns regarding medicines.  The following changes have been made:  See above Labs/ tests ordered today include:see above  Disposition:   FU:  see above  Signed, Cecilie Kicks, NP  03/23/2016 2:20 PM    [address] [address], [place], [place] Phone: [phone]; Fax: [phone]

## 2016-03-23 NOTE — Patient Instructions (Signed)
Medication Instructions:  None  Labwork: None  Testing/Procedures: None  Follow-Up: Your physician recommends that you schedule a follow-up appointment in: 2 weeks with an extender.  Your physician recommends that you schedule a follow-up appointment next available with Dr. Radford Pax.   Any Other Special Instructions Will Be Listed Below (If Applicable).  1)  Increase your fluids to at least 60 ozs of water or Gatorade a day. 2)  Decrease coffee to one small cup daily. 3) Liberalize the amount of salt you have in your diet.    If you need a refill on your cardiac medications before your next appointment, please call your pharmacy.

## 2016-03-31 ENCOUNTER — Encounter: Payer: Self-pay | Admitting: Neurology

## 2016-03-31 ENCOUNTER — Ambulatory Visit (INDEPENDENT_AMBULATORY_CARE_PROVIDER_SITE_OTHER): Payer: BLUE CROSS/BLUE SHIELD | Admitting: Neurology

## 2016-03-31 VITALS — BP 102/66 | HR 76 | Resp 16 | Ht 72.0 in | Wt 151.0 lb

## 2016-03-31 DIAGNOSIS — R419 Unspecified symptoms and signs involving cognitive functions and awareness: Secondary | ICD-10-CM | POA: Diagnosis not present

## 2016-03-31 DIAGNOSIS — R410 Disorientation, unspecified: Secondary | ICD-10-CM

## 2016-03-31 NOTE — Patient Instructions (Addendum)
You have complaints of memory loss: memory loss or changes in cognitive function can have many reasons and does not always mean you have dementia. Conditions that can contribute to subjective or objective memory loss include: depression, stress, poor sleep from insomnia or sleep apnea, dehydration, fluctuation in blood sugar values, thyroid or electrolyte dysfunction and certain vitamin deficiencies. Dementia can be caused by stroke, brain atherosclerosis or brain vascular disease due to vascular risk factors (smoking, high blood pressure, high cholesterol, obesity and uncontrolled diabetes), certain degenerative brain disorders (including Parkinson's disease and Multiple sclerosis) and by Alzheimer's disease or other, more rare and sometimes hereditary causes. We will do some additional testing: blood work (which has been done recently already) and we will do a brain scan. We will do an EEG (brainwave test), which we will schedule. We will call you with the results.   We will not start medication as yet, no need. We will also consider formal cognitive test called neuropsychological evaluation which is done by a licensed neuropsychologist. We will make a referral in that regard as needed. We will call you with brain scan test results and monitor your symptoms. Your memory loss is rather minimal at this point, which, of course is reassuring.

## 2016-03-31 NOTE — Progress Notes (Signed)
Subjective:    Patient ID: Autumn Autumn Foster is a 50 y.o. female.  HPI     Autumn Age, MD, PhD [facility] [address] [place], [place] [address]  Dear Dr. Nancy Autumn Foster,   I saw your patient, Autumn Autumn Foster, upon your kind request in my neurologic clinic today for initial consultation of her memory changes. The patient is unaccompanied today. As you know, Autumn Autumn Foster is a 50 year old right-handed woman with an underlying medical history of osteopenia, anxiety, HPV infection, cervical dysplasia, depression, anemia, and a prior diagnosis of Lyme disease, For which she was treated with antibiotics about 8-9 years ago, also treatment of [facility] spotted fever several years ago with antibiotics, history of multiple tick bites and also diagnosis of chronic Lyme, who reports mental fogginess for the past 6+ months, feels it is progressive, but more subjective, a feeling of disorientation and global weakness, as well as feeling of disorientation, staring off into space, does not lose time. No hx of seizures, epilepsy, migraines, denies TIA Sx.  No family history of dementia with the exception that paternal grandmother had dementia in her late 30s and lived to be admitted 49s. Father is 55 years old and has a history of hypertension and TIAs. She reports a feeling of dizziness, no vertigo. She feels some head tightness but no headaches. She lacks stamina. She has been very physically fit for many years and has even run marathons. She exercises regularly but feels that in the past 5-6 years her stamina has declined. She feels physically exhausted and symptoms are exacerbated by exercise or post exercise she feels more weak and physically drained. She recently saw on 03/19/2016 an allergy specialist at [facility]. She had additional blood work which I reviewed through [facility]. Normal CRP, normal TSH, normal free T4, normal ESR. Immunology workup showed what I believe allergy  to one type of grass and Juniper. She has a history of orthostatic hypotension. She was recently seen by cardiology on 03/23/2016. I reviewed the note from Ms. Autumn Ar, NP. Patient was advised to drink more water and add Gatorade and consider compression stockings and addition of Florinef in the future was discussed as well. I reviewed your office note from 03/17/2016. You have referred her to immunology in addition to cardiology. She has had recent lab work in your office which we will request. She has been seeing Dr. Wynona Foster at [facility] and has been on hormone therapy, had extensive blood work through that office as well, but test results are not available for my review today.  She denies any significant anxiety or depression, has no significant stress, is satisfied with her life. Has been married for 24 years. She has 3 children ages 24, 41 and 50. She does not always sleep well. Very occasionally will she take a lorazepam at night. She has not taken it regularly by any means. She drinks alcohol occasionally, is a nonsmoker, drinks 1 cup of coffee a day. Nor snoring or apneas are reported.  Her Past Medical History Is Significant For: Past Medical History  Diagnosis Date  . History of chicken pox   . Yeast infection   . Bacterial infection   . Herpes   . Urinary frequency   . Anemia   . Osteopenia   . Candida vaginitis   . Ovarian cyst   . Anxiety   . Menorrhagia   . Endometrial polyp   . ASCUS on Pap smear   .  PMDD (premenstrual dysphoric disorder)   . Bladder spasm   . Dysmenorrhea   . HPV (human papilloma virus) infection   . Rectal bleed   . Cervical dysplasia   . Blood in stool   . Depression   . Malignancy ([place])   . Genital warts   . Urine incontinence   . Colon polyp     tubular adenoma  . Hemorrhoids   . Lyme disease   . Low blood pressure     Her Past Surgical History Is Significant For: Past Surgical History  Procedure Laterality Date  . Hysteroscopy     . Dilation and curettage of uterus    . Colonoscopy  12/29/2010    2 sessile polyps Dr. Fuller Plan    Her Family History Is Significant For: Family History  Problem Relation Foster of Onset  . Thyroid disease Maternal Grandmother   . Prostate cancer Father   . Hypertension Father   . Diabetes Maternal Grandfather   . Migraines Mother   . Renal cancer Father   . Alcohol abuse Mother   . Suicidality Mother     deceased  . Colon cancer Paternal Grandfather   . Dementia Paternal Grandmother   . Rheum arthritis Maternal Grandmother   . Anxiety disorder Sister     x 2  . Diabetes Sister     prediabeties    Her Social History Is Significant For: Social History   Social History  . Marital Status: Married    Spouse Name: Autumn Autumn Foster   . Number of Children: 3  . Years of Education: college   Social History Main Topics  . Smoking status: Never Smoker   . Smokeless tobacco: Never Used  . Alcohol Use: 0.0 oz/week    0 Autumn Foster drinks or equivalent per week     Comment: socially   . Drug Use: No  . Sexual Activity: Yes     Comment: vasectony   Other Topics Concern  . None   Social History Narrative   Drinks 1 cup of coffee a day     Her Allergies Are:  Allergies  Allergen Reactions  . Doxycycline     Fatigue  . Lexapro [Escitalopram Oxalate] Other (See Comments)    Insomnia   . Pristiq [Desvenlafaxine]     Dry heaves/dizzy  . Prozac [Fluoxetine Hcl]     HA and Strange Dreams  . Septra Ds [Sulfamethoxazole-Trimethoprim]     Jitteriness/hyper/affecting sleep  . Penicillins Rash    Childhood rash  :   Her Current Medications Are:  Outpatient Encounter Prescriptions as of 03/31/2016  Medication Sig  . AMBULATORY NON FORMULARY MEDICATION Apply 0.5 cm topically as needed. Medication Name: Yell Gel apply small amount to clitoral area 30 minutes before intercourse  . Ascorbic Acid (VITAMIN C) 1000 MG tablet Take 1,000 mg by mouth daily.  . cholecalciferol (VITAMIN D) 1000 UNITS  tablet Take 4,000 Units by mouth daily.   . Coenzyme Q10 (CO Q 10 PO) Take 200 mg by mouth as directed.  Marland Kitchen estradiol (CLIMARA - DOSED IN MG/24 HR) 0.025 mg/24hr patch Place 0.025 mg onto the skin once a week.  . fish oil-omega-3 fatty acids 1000 MG capsule Take 2 g by mouth daily.  . fludrocortisone (FLORINEF) 0.1 MG tablet   . LORazepam (ATIVAN) 0.5 MG tablet Take 0.5 mg by mouth every 8 (eight) hours.  Marland Kitchen MAGNESIUM PO Take 1 tablet by mouth daily.   . Probiotic Product (ACIDOPHILUS PROBIOTIC BLEND) CAPS Take 1  capsule by mouth daily.  . progesterone (PROMETRIUM) 100 MG capsule Take 100 mg by mouth daily.  . [DISCONTINUED] valACYclovir (VALTREX) 500 MG tablet Take 500 mg by mouth 2 (two) times daily.   No facility-administered encounter medications on file as of 03/31/2016.  :  Review of Systems:  Out of a complete 14 point review of systems, all are reviewed and negative with the exception of these symptoms as listed below:  Review of Systems  Neurological:       Patient reports that she had been diagnosed with Lyme Disease.  Patient states that recently she has started to feel "disoriented", "foggy brain", dizziness, tightness in her head, decreased sleep, unable to exercise like she use to, and increased fatigue.     Objective:  Neurologic Exam  Physical Exam Physical Examination:   There were no vitals filed for this visit.  General Examination: The patient is a very pleasant 50 y.o. female in no acute distress. She appears well-developed and well-nourished and very well groomed.   HEENT: Normocephalic, atraumatic, pupils are equal, round and reactive to light and accommodation. Funduscopic exam is normal with sharp disc margins noted. Extraocular tracking is good without limitation to gaze excursion or nystagmus noted. Normal smooth pursuit is noted. Hearing is grossly intact. Tympanic membranes are clear bilaterally. Face is symmetric with normal facial animation and normal facial  sensation. Speech is clear with no dysarthria noted. There is no hypophonia. There is no lip, neck/head, jaw or voice tremor. Neck is supple with full range of passive and active motion. There are no carotid bruits on auscultation. Oropharynx exam reveals: mild mouth dryness, good dental hygiene and mild airway crowding, due to prominent uvula and thicker soft palate. Mallampati is class I. Tongue protrudes centrally and palate elevates symmetrically.   Chest: Clear to auscultation without wheezing, rhonchi or crackles noted.  Heart: S1+S2+0, regular and normal without murmurs, rubs or gallops noted.   Abdomen: Soft, non-tender and non-distended with normal bowel sounds appreciated on auscultation.  Extremities: There is no pitting edema in the distal lower extremities bilaterally. Pedal pulses are intact.  Skin: Warm and dry without trophic changes noted. There are no varicose veins.  Musculoskeletal: exam reveals no obvious joint deformities, tenderness or joint swelling or erythema.   Neurologically:  Mental status: The patient is awake, alert and oriented in all 4 spheres. Her immediate and remote memory, attention, language skills and fund of knowledge are appropriate. There is no evidence of aphasia, agnosia, apraxia or anomia. Speech is clear with normal prosody and enunciation. Thought process is linear. Mood is normal and affect is normal.   On 03/31/2016: MOCA: 28/30, CDT: 4/4, category fluency: 22/min.  Cranial nerves II - XII are as described above under HEENT exam. In addition: shoulder shrug is normal with equal shoulder height noted. Motor exam: Normal bulk, strength and tone is noted. There is no drift, tremor or rebound. Romberg is negative. Reflexes are 2+ throughout. Babinski: Toes are flexor bilaterally. Fine motor skills and coordination: intact with normal finger taps, normal hand movements, normal rapid alternating patting, normal foot taps and normal foot agility.   Cerebellar testing: No dysmetria or intention tremor on finger to nose testing. Heel to shin is unremarkable bilaterally. There is no truncal or gait ataxia.  Sensory exam: intact to light touch, pinprick, vibration, temperature sense in the upper and lower extremities.  Gait, station and balance: She stands easily. No veering to one side is noted. No leaning to one  side is noted. Posture is Foster-appropriate and stance is narrow based. Gait shows normal stride length and normal pace. No problems turning are noted. She turns en bloc. Tandem walk is unremarkable. Intact toe and heel stance is noted.               Assessment and Plan:    In summary, Autumn Autumn Foster is a very pleasant 50 y.o.-year old female with an underlying medical history of osteopenia, anxiety, HPV infection, cervical dysplasia, depression, anemia, and a prior diagnosis of Lyme disease, For which she was treated with antibiotics about 8-9 years ago, also treatment of [facility] spotted fever several years ago with antibiotics, history of multiple tick bites and also diagnosis of chronic Lyme, who presents for initial consultation of a number of fatigue symptoms including mental fogginess, feeling of disorientation, feeling of staring spells, feeling of lack of energy, none of these acute, perhaps progressive over the past 6+ months, with some symptoms such as the physical stamina reduction ongoing for 5-6 years. Her physical exam and neurological exam are nonfocal which is reassuring. Differential diagnosis is vast. I suggested we proceed with additional testing from my end of things to rule out an organic neurological cause. Thus far, exam-wise and history-wise there is no sinister symptoms reported, no red flags. At this time we will initiate additional blood work, MRI brain with and without contrast and EEG. We will keep her posted by phone as to her test results. I talked her extensively today during her long conversation. We talked  about maintaining a healthy lifestyle, eating a balanced diet, exercising in moderation and building up exercise slowly. She has done some yoga and meditation which she finds also helpful. I will see her back in a few months, sooner as needed, we also talked about potentially utilizing a formal cognitive evaluation by way of a neuropsychological consultation. She is open to this, and we will consider this in the near future. She tested quite well on her memory exam. She is reassured in that regard as well. I answered all her questions today and the patient was in agreement with the plan.  Thank you very much for allowing me to participate in the care of this nice patient. If I can be of any further assistance to you please do not hesitate to call me at [phone].  Sincerely,   Autumn Age, MD, PhD

## 2016-04-01 ENCOUNTER — Telehealth: Payer: Self-pay | Admitting: Cardiology

## 2016-04-01 NOTE — Telephone Encounter (Signed)
To Dr. Radford Pax as Juluis Rainier.

## 2016-04-01 NOTE — Progress Notes (Signed)
Quick Note:  Labs fine, thus far, just a couple of things pending, will update as needed. Please notify patient.  Autumn Age, MD, PhD [facility]  ______

## 2016-04-01 NOTE — Telephone Encounter (Signed)
New message     FYI Pt has an appt scheduled for Monday.  She want it in her chart that her physician---Dr Hubbard Robinson, checked her aldostrone and it was 4.4.  He put her on floranase.

## 2016-04-02 LAB — COMPREHENSIVE METABOLIC PANEL
ALK PHOS: 56 IU/L (ref 39–117)
ALT: 17 IU/L (ref 0–32)
AST: 22 IU/L (ref 0–40)
Albumin/Globulin Ratio: 2 (ref 1.2–2.2)
Albumin: 5.1 g/dL (ref 3.5–5.5)
BUN/Creatinine Ratio: 21 (ref 9–23)
BUN: 13 mg/dL (ref 6–24)
Bilirubin Total: 0.4 mg/dL (ref 0.0–1.2)
CALCIUM: 9.8 mg/dL (ref 8.7–10.2)
CHLORIDE: 99 mmol/L (ref 96–106)
CO2: 24 mmol/L (ref 18–29)
Creatinine, Ser: 0.63 mg/dL (ref 0.57–1.00)
GFR calc Af Amer: 120 mL/min/{1.73_m2} (ref >59)
GFR, EST NON AFRICAN AMERICAN: 104 mL/min/{1.73_m2} (ref >59)
GLUCOSE: 91 mg/dL (ref 65–99)
Globulin, Total: 2.5 g/dL (ref 1.5–4.5)
Potassium: 4.4 mmol/L (ref 3.5–5.2)
Sodium: 138 mmol/L (ref 134–144)
Total Protein: 7.6 g/dL (ref 6.0–8.5)

## 2016-04-02 LAB — URINALYSIS
BILIRUBIN UA: NEGATIVE
Glucose, UA: NEGATIVE
Ketones, UA: NEGATIVE
LEUKOCYTES UA: NEGATIVE
NITRITE UA: NEGATIVE
PROTEIN UA: NEGATIVE
RBC, UA: NEGATIVE
Specific Gravity, UA: 1.01 (ref 1.005–1.030)
Urobilinogen, Ur: 0.2 mg/dL (ref 0.2–1.0)
pH, UA: 7 (ref 5.0–7.5)

## 2016-04-02 LAB — RHEUMATOID FACTOR: Rhuematoid fact SerPl-aCnc: <10 IU/mL (ref 0.0–13.9)

## 2016-04-02 LAB — HEAVY METALS PROFILE II, BLOOD
Arsenic: 9 ug/L (ref 2–23)
CADMIUM: NOT DETECTED ug/L (ref 0.0–1.2)
LEAD, BLOOD: NOT DETECTED ug/dL (ref 0–19)
Mercury: 1 ug/L (ref 0.0–14.9)

## 2016-04-02 LAB — RPR: RPR: NONREACTIVE

## 2016-04-02 LAB — VITAMIN B6

## 2016-04-02 LAB — B12 AND FOLATE PANEL
Folate: 16.4 ng/mL (ref >3.0)
Vitamin B-12: 936 pg/mL (ref 211–946)

## 2016-04-02 LAB — METHYLMALONIC ACID, SERUM: Methylmalonic Acid: 148 nmol/L (ref 0–378)

## 2016-04-02 LAB — VITAMIN B1: THIAMINE: 168 nmol/L (ref 66.5–200.0)

## 2016-04-02 LAB — ANA W/REFLEX: ANA: NEGATIVE

## 2016-04-02 LAB — VITAMIN D 25 HYDROXY (VIT D DEFICIENCY, FRACTURES): VIT D 25 HYDROXY: 47.7 ng/mL (ref 30.0–100.0)

## 2016-04-02 LAB — HIV ANTIBODY (ROUTINE TESTING W REFLEX): HIV SCREEN 4TH GENERATION: NONREACTIVE

## 2016-04-02 LAB — HGB A1C W/O EAG: Hgb A1c MFr Bld: 5 % (ref 4.8–5.6)

## 2016-04-05 ENCOUNTER — Ambulatory Visit (INDEPENDENT_AMBULATORY_CARE_PROVIDER_SITE_OTHER): Payer: BLUE CROSS/BLUE SHIELD | Admitting: Cardiology

## 2016-04-05 ENCOUNTER — Encounter: Payer: Self-pay | Admitting: Cardiology

## 2016-04-05 ENCOUNTER — Encounter: Payer: Self-pay | Admitting: Neurology

## 2016-04-05 VITALS — BP 102/60 | HR 67 | Ht 72.0 in | Wt 152.4 lb

## 2016-04-05 DIAGNOSIS — R42 Dizziness and giddiness: Secondary | ICD-10-CM | POA: Diagnosis not present

## 2016-04-05 DIAGNOSIS — E2749 Other adrenocortical insufficiency: Secondary | ICD-10-CM

## 2016-04-05 DIAGNOSIS — G90A Postural orthostatic tachycardia syndrome (POTS): Secondary | ICD-10-CM

## 2016-04-05 DIAGNOSIS — I498 Other specified cardiac arrhythmias: Secondary | ICD-10-CM

## 2016-04-05 DIAGNOSIS — R000 Tachycardia, unspecified: Secondary | ICD-10-CM

## 2016-04-05 DIAGNOSIS — I951 Orthostatic hypotension: Secondary | ICD-10-CM | POA: Diagnosis not present

## 2016-04-05 DIAGNOSIS — E2740 Unspecified adrenocortical insufficiency: Secondary | ICD-10-CM

## 2016-04-05 DIAGNOSIS — I493 Ventricular premature depolarization: Secondary | ICD-10-CM | POA: Diagnosis not present

## 2016-04-05 DIAGNOSIS — R5383 Other fatigue: Secondary | ICD-10-CM

## 2016-04-05 HISTORY — DX: Orthostatic hypotension: I95.1

## 2016-04-05 HISTORY — DX: Dizziness and giddiness: R42

## 2016-04-05 HISTORY — DX: Other specified cardiac arrhythmias: I49.8

## 2016-04-05 HISTORY — DX: Postural orthostatic tachycardia syndrome (POTS): G90.A

## 2016-04-05 NOTE — Patient Instructions (Signed)
Medication Instructions:  Your physician recommends that you continue on your current medications as directed. Please refer to the Current Medication list given to you today.   Labwork: None  Testing/Procedures: Your physician has requested that you have a stress echocardiogram. For further information please visit [email or web address]. Please follow instruction sheet as given.  Follow-Up: You have been referred to Dr. Chalmers Cater.  Your physician recommends that you schedule a follow-up appointment AS NEEDED with Dr. Radford Pax pending study results.  Any Other Special Instructions Will Be Listed Below (If Applicable).     If you need a refill on your cardiac medications before your next appointment, please call your pharmacy.

## 2016-04-05 NOTE — Progress Notes (Signed)
Cardiology Office Note    Date:  04/09/2016   ID:  Autumn Foster, DOB 1966-07-20, MRN [id number]  PCP:  Reginia Naas, MD  Cardiologist:  Fransico Him, MD   No chief complaint on file.   History of Present Illness:  Autumn Foster is a 50 y.o. female  with a history of PVC's who presents toady for followup of palpitations. She has a history of anxiety and last saw me in 2015 with concerns that her great grandparents died of heart disease. ETT at that time showed no ischemia.  Heart monitor was normal.  She was recently referred back to our office and seen by our NP for complaints of hypotension and dizziness post exercise and her PCP was concerned that she may have POTS.  She would feel disoriented when her SBP dropped into the low 90's.  She was orthostatic on last OV. She was weaning off hormones at the time she was seen.  She was encouraged to limit her intake of coffee and increase her water intake and try Gatorade at least once daly.  She is now here for followup. Since we saw her last she has been seeing a physician in [place] that has diagnosed her with "adrenal Fatigue" and placed her on Florinef for a presumed low aldosterone level.  SHe has never seen an endocrinologist.  She is taking 0.5mg  daily and since starting this she feels better but is still having episodes where she feels worn out after a workout.  She says that she will have problems with her BP dropping low after a workout and her HR will be elevated.  She had been taking testosterone at one point but is now on progesterone.  She has problems still as wel with her heart racing when her BP drops after exercise.  She is still concerned about the possibility of having CAD given her family history of cardiac disease.     Past Medical History  Diagnosis Date  . History of chicken pox   . Yeast infection   . Bacterial infection   . Herpes   . Urinary frequency   . Anemia   . Osteopenia   . Candida vaginitis   .  Ovarian cyst   . Anxiety   . Menorrhagia   . Endometrial polyp   . ASCUS on Pap smear   . PMDD (premenstrual dysphoric disorder)   . Bladder spasm   . Dysmenorrhea   . HPV (human papilloma virus) infection   . Rectal bleed   . Cervical dysplasia   . Blood in stool   . Depression   . Malignancy ([place])   . Genital warts   . Urine incontinence   . Colon polyp     tubular adenoma  . Hemorrhoids   . Lyme disease   . Low blood pressure   . Dizziness 04/05/2016  . Orthostatic hypotension 04/05/2016  . POTS (postural orthostatic tachycardia syndrome) 04/05/2016    Past Surgical History  Procedure Laterality Date  . Hysteroscopy    . Dilation and curettage of uterus    . Colonoscopy  12/29/2010    2 sessile polyps Dr. Fuller Plan    Current Medications: Outpatient Prescriptions Prior to Visit  Medication Sig Dispense Refill  . Ascorbic Acid (VITAMIN C) 1000 MG tablet Take 1,000 mg by mouth daily.    . cholecalciferol (VITAMIN D) 1000 UNITS tablet Take 4,000 Units by mouth daily.     . Coenzyme Q10 (CO Q 10 PO) Take  100 mg by mouth as directed.     . fish oil-omega-3 fatty acids 1000 MG capsule Take 2 g by mouth daily.    . fludrocortisone (FLORINEF) 0.1 MG tablet   3  . LORazepam (ATIVAN) 0.5 MG tablet Take 0.5 mg by mouth every 8 (eight) hours.    . Probiotic Product (ACIDOPHILUS PROBIOTIC BLEND) CAPS Take 1 capsule by mouth daily.    . progesterone (PROMETRIUM) 100 MG capsule Take 100 mg by mouth daily.    Marland Kitchen MAGNESIUM PO Take 1 tablet by mouth daily. 400-800 MG as directed Reported on 04/05/2016    . AMBULATORY NON FORMULARY MEDICATION Apply 0.5 cm topically as needed. Medication Name: Yell Gel apply small amount to clitoral area 30 minutes before intercourse (Patient not taking: Reported on 04/05/2016) 10 g 6  . estradiol (CLIMARA - DOSED IN MG/24 HR) 0.025 mg/24hr patch Place 0.025 mg onto the skin once a week. Reported on 04/05/2016     No facility-administered medications prior to  visit.     Allergies:   Doxycycline; Lexapro; Pristiq; Prozac; Septra ds; and Penicillins   Social History   Social History  . Marital Status: Married    Spouse Name: Autumn Foster   . Number of Children: 3  . Years of Education: college   Social History Main Topics  . Smoking status: Never Smoker   . Smokeless tobacco: Never Used  . Alcohol Use: 0.0 oz/week    0 Foster drinks or equivalent per week     Comment: socially   . Drug Use: No  . Sexual Activity: Yes     Comment: vasectony   Other Topics Concern  . None   Social History Narrative   Drinks 1 cup of coffee a day      Family History:  The patient's family history includes Alcohol abuse in her mother; Anxiety disorder in her sister; Colon cancer in her paternal grandfather; Dementia in her paternal grandmother; Diabetes in her maternal grandfather and sister; Hypertension in her father; Migraines in her mother; Prostate cancer in her father; Renal cancer in her father; Rheum arthritis in her maternal grandmother; Suicidality in her mother; Thyroid disease in her maternal grandmother.   ROS:   Please see the history of present illness.    ROS All other systems reviewed and are negative.   PHYSICAL EXAM:   VS:  BP 102/60 mmHg  Pulse 67  Ht 6' (1.829 m)  Wt 152 lb 6.4 oz (69.128 kg)  BMI 20.66 kg/m2  SpO2 99%   GEN: Well nourished, well developed, in no acute distress HEENT: normal Neck: no JVD, carotid bruits, or masses Cardiac: RRR; no murmurs, rubs, or gallops,no edema.  Intact distal pulses bilaterally.  Respiratory:  clear to auscultation bilaterally, normal work of breathing GI: soft, nontender, nondistended, + BS MS: no deformity or atrophy Skin: warm and dry, no rash Neuro:  Alert and Oriented x 3, Strength and sensation are intact Psych: euthymic mood, full affect  Wt Readings from Last 3 Encounters:  04/05/16 152 lb 6.4 oz (69.128 kg)  03/31/16 151 lb (68.493 kg)  03/23/16 153 lb (69.4 kg)       Studies/Labs Reviewed:   EKG:  EKG is not ordered today.    Recent Labs: 03/31/2016: ALT 17; BUN 13; Creatinine, Ser 0.63; Potassium 4.4; Sodium 138   Lipid Panel    Component Value Date/Time   CHOL 196 11/07/2012 0915   TRIG 31.0 11/07/2012 0915   HDL 91.80 11/07/2012 0915  CHOLHDL 2 11/07/2012 0915   VLDL 6.2 11/07/2012 0915   LDLCALC 98 11/07/2012 0915    Additional studies/ records that were reviewed today include:  none    ASSESSMENT:    1. PVC (premature ventricular contraction)   2. Dizziness   3. Orthostatic hypotension   4. 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 Other fatigue   6. POTS (postural orthostatic tachycardia syndrome)      PLAN:  In order of problems listed above:  1. PVC's - these are very well controlled and not very bothersome. 2. Dizziness related to orthostatic hypotension - she feels better on florinef and I have instructed her to increase her dose to 0.1mg  daily.  She does not want to drink gatorade because she is worried about getting too much potassium. 3. Orthostatic hypotension - see above - she has been seeing an alternative medicine MD in [place] who diagnosed her with "Adrenal Fatigue" and she is on Florinef as well as progesterone.  I have recommended that she be referred to Endocrinology for complete workup.  I will refer her to Dr. Chalmers Cater.   4. Hypoaldosteronism ? Diagnosis - will refer to Dr. Chalmers Cater 5.         Faituge ? Etiology - she states that she also was diagnosed with Lyme disease which may be contributing to this.  She is very worried that she may have CAD so I will get a stress echo to rule out ischemia and ease her mind. 6.         Postural orthostatic tachycardia - see above    Medication Adjustments/Labs and Tests Ordered: Current medicines are reviewed at length with the patient today.  Concerns regarding medicines are outlined above.  Medication changes, Labs and Tests ordered today are listed in the Patient Instructions  below.  Patient Instructions  Medication Instructions:  Your physician recommends that you continue on your current medications as directed. Please refer to the Current Medication list given to you today.   Labwork: None  Testing/Procedures: Your physician has requested that you have a stress echocardiogram. For further information please visit [email or web address]. Please follow instruction sheet as given.  Follow-Up: You have been referred to Dr. Chalmers Cater.  Your physician recommends that you schedule a follow-up appointment AS NEEDED with Dr. Radford Pax pending study results.  Any Other Special Instructions Will Be Listed Below (If Applicable).     If you need a refill on your cardiac medications before your next appointment, please call your pharmacy.       Signed, Fransico Him, MD  04/09/2016 12:50 PM    [facility] [place], [place], [place]  [address] Phone: [phone]; Fax: [phone]

## 2016-04-05 NOTE — Progress Notes (Signed)
Quick Note:  B6 elevated. Would stop vit B6 supplement, if she is on one. Please update patient.  sa ______

## 2016-04-06 ENCOUNTER — Encounter: Payer: Self-pay | Admitting: Neurology

## 2016-04-09 ENCOUNTER — Ambulatory Visit: Payer: BLUE CROSS/BLUE SHIELD | Admitting: Physician Assistant

## 2016-04-14 ENCOUNTER — Ambulatory Visit (INDEPENDENT_AMBULATORY_CARE_PROVIDER_SITE_OTHER): Payer: BLUE CROSS/BLUE SHIELD

## 2016-04-14 DIAGNOSIS — R419 Unspecified symptoms and signs involving cognitive functions and awareness: Secondary | ICD-10-CM

## 2016-04-14 DIAGNOSIS — R410 Disorientation, unspecified: Secondary | ICD-10-CM | POA: Diagnosis not present

## 2016-04-15 MED ORDER — GADOPENTETATE DIMEGLUMINE 469.01 MG/ML IV SOLN: 15.0000 mL | Freq: Once | INTRAVENOUS | Status: DC | PRN

## 2016-04-15 NOTE — Progress Notes (Signed)
Quick Note:  Please call and advise the patient that the recent scan we did was within normal limits. We did a brain MRI with and without contrast, which showed normal findings. In particular, there were no acute findings, such as a stroke, or mass or blood products, no abnormal contrast uptake. No further action is required on this test at this time. Please remind patient to keep any upcoming appointments or tests and to call us with any interim questions, concerns, problems or updates. Thanks,  [name], MD, PhD   ______

## 2016-04-19 ENCOUNTER — Telehealth: Payer: Self-pay

## 2016-04-19 NOTE — Telephone Encounter (Signed)
I spoke to patient and she is aware of results and voiced understanding.

## 2016-04-19 NOTE — Telephone Encounter (Signed)
-----   Message from [name], MD sent at 04/15/2016  6:39 PM EDT ----- Please call and advise the patient that the recent scan we did was within normal limits. We did a brain MRI with and without contrast, which showed normal findings. In particular, there were no acute findings, such as a stroke, or mass or blood products, no abnormal contrast uptake. No further action is required on this test at this time. Please remind patient to keep any upcoming appointments or tests and to call us with any interim questions, concerns, problems or updates. Thanks,  [name], MD, PhD

## 2016-04-26 ENCOUNTER — Ambulatory Visit (INDEPENDENT_AMBULATORY_CARE_PROVIDER_SITE_OTHER): Payer: BLUE CROSS/BLUE SHIELD

## 2016-04-26 DIAGNOSIS — R410 Disorientation, unspecified: Secondary | ICD-10-CM

## 2016-04-26 DIAGNOSIS — R419 Unspecified symptoms and signs involving cognitive functions and awareness: Secondary | ICD-10-CM

## 2016-04-26 NOTE — Procedures (Signed)
    History:  Autumn Foster is a 50 year old patient with a history of some memory changes. The patient has had some forgetfulness over the last 6 months. The patient is being evaluated for these changes in her cognitive functioning.   This is a routine EEG. No skull defects are noted. The medications include vitamin C, vitamin D, estradiol, Florinef, Ativan, progesterone, and magnesium supplementation.   EEG classification: Normal awake and drowsy  Description of the recording: The background rhythms of this recording consists of a fairly well modulated medium amplitude alpha rhythm of 9 Hz that is reactive to eye opening and closure. As the record progresses, the patient appears to remain in the waking state throughout the recording. Photic stimulation was not performed. Hyperventilation was performed, resulting in a minimal buildup of the background rhythm activities without significant slowing seen. Toward the end of the recording, the patient enters the drowsy state with slight symmetric slowing seen. The patient never enters stage II sleep. At no time during the recording does there appear to be evidence of spike or spike wave discharges or evidence of focal slowing. EKG monitor shows no evidence of cardiac rhythm abnormalities with a heart rate of 60.  Impression: This is a normal EEG recording in the waking and drowsy state. No evidence of ictal or interictal discharges are seen.

## 2016-04-26 NOTE — Progress Notes (Signed)
Quick Note:  Please call and advise the patient that the EEG or brain wave test we performed was reported as normal in the awake and drowsy states. We checked for abnormal electrical discharges in the brain waves and the report suggested normal findings. No further action is required on this test at this time. Please remind patient to keep any upcoming appointments or tests and to call us with any interim questions, concerns, problems or updates. Thanks,  Jarod Bozzo, MD, PhD    ______ 

## 2016-04-28 ENCOUNTER — Telehealth: Payer: Self-pay

## 2016-04-28 NOTE — Telephone Encounter (Signed)
I spoke to patient and she is aware of results and recommendations.  

## 2016-04-28 NOTE — Telephone Encounter (Signed)
-----   Message from [name], MD sent at 04/26/2016  5:08 PM EDT ----- Please call and advise the patient that the EEG or brain wave test we performed was reported as normal in the awake and drowsy states. We checked for abnormal electrical discharges in the brain waves and the report suggested normal findings. No further action is required on this test at this time. Please remind patient to keep any upcoming appointments or tests and to call us with any interim questions, concerns, problems or updates. Thanks,  [name], MD, PhD

## 2016-04-30 ENCOUNTER — Telehealth: Payer: Self-pay

## 2016-04-30 ENCOUNTER — Telehealth (HOSPITAL_COMMUNITY): Payer: Self-pay | Admitting: *Deleted

## 2016-04-30 DIAGNOSIS — R42 Dizziness and giddiness: Secondary | ICD-10-CM

## 2016-04-30 DIAGNOSIS — R5383 Other fatigue: Secondary | ICD-10-CM

## 2016-04-30 NOTE — Telephone Encounter (Signed)
ECHO and GXT ordered for scheduling. Reviewed GXT instructions with patient. Stress ECHO scheduled for Monday cancelled. Patient understands she will be called to schedule GXT and ECHO. She was grateful for call.

## 2016-04-30 NOTE — Telephone Encounter (Signed)
-----   Message from [facility] sent at 04/30/2016  8:33 AM EDT ----- Regarding: FW: Denied Stress Echo   ----- Message -----    From: Lillia Pauls    Sent: 04/29/2016  11:04 AM      To: Theodoro Parma, RN Subject: FW: Denied Stress Echo                         Katie  Denied Stress Echo is for July 10.  Can you put in order for 2d echo and GXT and call pt?  Thanks  Charmaine ----- Message -----    From: Lillia Pauls    Sent: 04/28/2016  11:39 AM      To: Theodoro Parma, RN Subject: FW: Denied Stress Echo                         Katie  Can you please call pt and put in order for 2d Echo and GXT?  Thanks  Charmaine ----- Message -----    From: Sueanne Margarita, MD    Sent: 04/28/2016  11:13 AM      To: Lillia Pauls Subject: RE: Denied Stress Echo                         Get GXT and regular echo to assess LVF  Traci ----- Message -----    From: Lillia Pauls    Sent: 04/28/2016   9:37 AM      To: Sueanne Margarita, MD Subject: Denied Stress Echo                             Dr. Radford Pax,  Stress Echo denied.  Do you want to change to GXT?  Thanks  Charmaine

## 2016-04-30 NOTE — Telephone Encounter (Signed)
Patient given detailed instructions per Stress Test Requisition Sheet for test on 05/03/16 at 7:30.Patient Notified to arrive 30 minutes early, and that it is imperative to arrive on time for appointment to keep from having the test rescheduled.  Patient verbalized understanding. Veronia Beets

## 2016-05-03 ENCOUNTER — Other Ambulatory Visit (HOSPITAL_COMMUNITY): Payer: BLUE CROSS/BLUE SHIELD

## 2016-06-08 ENCOUNTER — Ambulatory Visit (HOSPITAL_COMMUNITY): Payer: BLUE CROSS/BLUE SHIELD | Attending: Cardiovascular Disease

## 2016-06-08 ENCOUNTER — Other Ambulatory Visit: Payer: Self-pay

## 2016-06-08 ENCOUNTER — Ambulatory Visit (INDEPENDENT_AMBULATORY_CARE_PROVIDER_SITE_OTHER): Payer: BLUE CROSS/BLUE SHIELD

## 2016-06-08 ENCOUNTER — Ambulatory Visit (INDEPENDENT_AMBULATORY_CARE_PROVIDER_SITE_OTHER): Payer: BLUE CROSS/BLUE SHIELD | Admitting: Neurology

## 2016-06-08 ENCOUNTER — Encounter: Payer: Self-pay | Admitting: Neurology

## 2016-06-08 VITALS — BP 104/62 | HR 78 | Resp 16 | Ht 72.0 in | Wt 148.0 lb

## 2016-06-08 DIAGNOSIS — R5383 Other fatigue: Secondary | ICD-10-CM | POA: Diagnosis not present

## 2016-06-08 DIAGNOSIS — R419 Unspecified symptoms and signs involving cognitive functions and awareness: Secondary | ICD-10-CM

## 2016-06-08 DIAGNOSIS — R29898 Other symptoms and signs involving the musculoskeletal system: Secondary | ICD-10-CM | POA: Diagnosis not present

## 2016-06-08 DIAGNOSIS — I959 Hypotension, unspecified: Secondary | ICD-10-CM | POA: Insufficient documentation

## 2016-06-08 DIAGNOSIS — R42 Dizziness and giddiness: Secondary | ICD-10-CM

## 2016-06-08 LAB — EXERCISE TOLERANCE TEST
Estimated workload: 7.4 METS
Exercise duration (min): 6 min
Exercise duration (sec): 19 s
MPHR: 169 {beats}/min
Peak HR: 157 {beats}/min
Percent HR: 92 %
RPE: 92
Rest HR: 71 {beats}/min

## 2016-06-08 NOTE — Patient Instructions (Signed)
We will check muscle enzymes. If normal, we can play it by ear and see you back as needed.  We will call you with the results. Your exam looks good.  We may consider a formal neuropsychological test (aka cognitive testing) for your memory complaints if you have problems in the future. This requires a referral to a trained and licensed neuropsychologist and will be a separate appointment at a different clinic.

## 2016-06-08 NOTE — Progress Notes (Signed)
Subjective:    Foster ID: Autumn Foster is a 50 y.o. female.  HPI     Interim history:   Autumn Foster is a 50 year old right-handed woman with an underlying medical history of osteopenia, anxiety, HPV infection, cervical dysplasia, depression, anemia, and a prior diagnosis of Lyme disease (for which Autumn Foster was treated with antibiotics about 8-9 years ago), also Dx and Rx of RMSF several years ago with antibiotics, history of multiple tick bites and also diagnosis of chronic Lyme, who presents for follow-up consultation of Autumn Foster cognitive complaints of several months duration, also complaints of disorientation, weakness, staring spells. Autumn Foster is unaccompanied today. Autumn Foster first met Autumn Foster on 03/31/2016 at Autumn request of Autumn Foster primary care physician, at which time Autumn Foster reported mental fogginess for Autumn past 6 months or longer.   Neurologically exam was nonfocal. Autumn Foster did suggest we proceed with additional testing including blood work, brain MRI and EEG. We talked about potentially pursuing formal neuropsychological evaluation in Autumn near future. Autumn Foster MOCA: 28/30, CDT: 4/4, category fluency: 22/min at Autumn time.  Blood work from 03/31/2016 showed normal A1c, normal vitamin D level, negative ANA, negative rheumatoid factor, negative heavy metals profile, normal vitamin B1, nonreactive RPR nonreactive HIV testing, normal urinalysis, normal B12 and folate, normal CMP, vitamin B6 was elevated at over 100 and Autumn Foster was advised to stop any vitamin B6 supplements.  Autumn Foster had a brain MRI with and without contrast on 04/14/2016 which Autumn Foster reviewed: IMPRESSION:  This is a normal MRI of Autumn brain with and without contrast.   We called Autumn Foster with Autumn Foster test results.   Autumn Foster had an EEG on 04/26/2016 which Autumn Foster reviewed: This is a normal EEG recording in Autumn waking and drowsy state. No evidence of ictal or interictal discharges are seen.   We called Autumn Foster with Autumn Foster test results.  Today, 06/08/2016: Autumn Foster reports no new cognitive  complaints, feels stable in that regard, perhaps a little better even. Has other complaints including feeling dizzy, fatigued, sometimes weak in Autumn Foster legs, low stamina, perhaps more excitedly, depression, wonders if this is all anxiety related. No headaches, had additional blood work with Autumn Foster endocrinologist, had stress test today, has orthostatic dizziness, no actual vertigo, had a fainting spell last week. Feels lightheaded sometimes when Autumn Foster stands up too quickly. Tries to hydrate well with water and sometimes electrolyte water.  Previously:  03/31/2016: Autumn Foster reports mental fogginess for Autumn past 6+ months, feels it is progressive, but more subjective, a feeling of disorientation and global weakness, as well as feeling of disorientation, staring off into space, does not lose time. No hx of seizures, epilepsy, migraines, denies TIA Sx.  No family history of dementia with Autumn exception that paternal grandmother had dementia in Autumn Foster late 9s and lived to be admitted 36s. Father is 14 years old and has a history of hypertension and TIAs. Autumn Foster reports a feeling of dizziness, no vertigo. Autumn Foster feels some head tightness but no headaches. Autumn Foster lacks stamina. Autumn Foster has been very physically fit for many years and has even run marathons. Autumn Foster exercises regularly but feels that in Autumn past 5-6 years Autumn Foster stamina has declined. Autumn Foster feels physically exhausted and symptoms are exacerbated by exercise or post exercise Autumn Foster feels more weak and physically drained. Autumn Foster recently saw on 03/19/2016 an allergy specialist at [facility]. Autumn Foster had additional blood work which Autumn Foster reviewed through [facility]. Normal CRP, normal TSH, normal free T4, normal ESR. Immunology workup showed what Autumn Foster believe allergy to one type  of grass and Juniper. Autumn Foster has a history of orthostatic hypotension. Autumn Foster was recently seen by cardiology on 03/23/2016. Autumn Foster reviewed Autumn note from Ms. Dorene Ar, NP. Foster was advised to drink more water and add Gatorade and consider  compression stockings and addition of Florinef in Autumn future was discussed as well. Autumn Foster reviewed your office note from 03/17/2016. You have referred Autumn Foster to immunology in addition to cardiology. Autumn Foster has had recent lab work in your office which we will request. Autumn Foster has been seeing Dr. Wynona Meals at [facility] and has been on hormone therapy, had extensive blood work through that office as well, but test results are not available for my review today.   Autumn Foster denies any significant anxiety or depression, has no significant stress, is satisfied with Autumn Foster life. Has been married for 24 years. Autumn Foster has 3 children ages 38, 79 and 80. Autumn Foster does not always sleep well. Very occasionally will Autumn Foster take a lorazepam at night. Autumn Foster has not taken it regularly by any means. Autumn Foster drinks alcohol occasionally, is a nonsmoker, drinks 1 cup of coffee a day. Nor snoring or apneas are reported.  Autumn Foster Past Medical History Is Significant For: Past Medical History:  Diagnosis Date  . Anemia   . Anxiety   . ASCUS on Pap smear   . Bacterial infection   . Bladder spasm   . Blood in stool   . Candida vaginitis   . Cervical dysplasia   . Colon polyp    tubular adenoma  . Depression   . Dizziness 04/05/2016  . Dysmenorrhea   . Endometrial polyp   . Genital warts   . Hemorrhoids   . Herpes   . History of chicken pox   . HPV (human papilloma virus) infection   . Low blood pressure   . Lyme disease   . Malignancy ([place])   . Menorrhagia   . Orthostatic hypotension 04/05/2016  . Osteopenia   . Ovarian cyst   . PMDD (premenstrual dysphoric disorder)   . POTS (postural orthostatic tachycardia syndrome) 04/05/2016  . Rectal bleed   . Urinary frequency   . Urine incontinence   . Yeast infection     Autumn Foster Past Surgical History Is Significant For: Past Surgical History:  Procedure Laterality Date  . COLONOSCOPY  12/29/2010   2 sessile polyps Dr. Fuller Plan  . DILATION AND CURETTAGE OF UTERUS    . HYSTEROSCOPY      Autumn Foster Family  History Is Significant For: Family History  Problem Relation Age of Onset  . Thyroid disease Maternal Grandmother   . Prostate cancer Father   . Hypertension Father   . Diabetes Maternal Grandfather   . Migraines Mother   . Renal cancer Father   . Alcohol abuse Mother   . Suicidality Mother     deceased  . Colon cancer Paternal Grandfather   . Dementia Paternal Grandmother   . Rheum arthritis Maternal Grandmother   . Anxiety disorder Sister     x 2  . Diabetes Sister     prediabeties    Autumn Foster Social History Is Significant For: Social History   Social History  . Marital status: Married    Spouse name: Jaci Standard   . Number of children: 3  . Years of education: college   Social History Main Topics  . Smoking status: Never Smoker  . Smokeless tobacco: Never Used  . Alcohol use 0.0 oz/week     Comment: socially   . Drug use: No  .  Sexual activity: Yes     Comment: vasectony   Other Topics Concern  . None   Social History Narrative   Drinks 1 cup of coffee a day     Autumn Foster Allergies Are:  Allergies  Allergen Reactions  . Doxycycline     Fatigue  . Lexapro [Escitalopram Oxalate] Other (See Comments)    Insomnia   . Pristiq [Desvenlafaxine]     Dry heaves/dizzy  . Prozac [Fluoxetine Hcl]     HA and Strange Dreams  . Septra Ds [Sulfamethoxazole-Trimethoprim]     Jitteriness/hyper/affecting sleep  . Penicillins Rash    Childhood rash  :   Autumn Foster Current Medications Are:  Outpatient Encounter Prescriptions as of 06/08/2016  Medication Sig  . Ascorbic Acid (VITAMIN C) 1000 MG tablet Take 1,000 mg by mouth daily.  . cholecalciferol (VITAMIN D) 1000 UNITS tablet Take 4,000 Units by mouth daily.   . Coenzyme Q10 (CO Q 10 PO) Take 100 mg by mouth as directed.   Marland Kitchen estradiol (VIVELLE-DOT) 0.0375 MG/24HR APPLY EVERY 3 DAYS.  . fish oil-omega-3 fatty acids 1000 MG capsule Take 2 g by mouth daily.  . fludrocortisone (FLORINEF) 0.1 MG tablet   . GLYCINE PO Take by mouth.  Marland Kitchen  LORazepam (ATIVAN) 0.5 MG tablet Take 0.5 mg by mouth every 8 (eight) hours.  Marland Kitchen MAGNESIUM PO Take 1 tablet by mouth daily. 400-800 MG as directed Reported on 04/05/2016  . niacin 250 MG tablet Take 250 mg by mouth at bedtime.  . Probiotic Product (ACIDOPHILUS PROBIOTIC BLEND) CAPS Take 1 capsule by mouth daily.  . progesterone (PROMETRIUM) 100 MG capsule Take 100 mg by mouth daily.  . TESTOSTERONE BU Place inside cheek.  . [DISCONTINUED] AMBULATORY NON FORMULARY MEDICATION Apply 0.5 cm topically as needed. Medication Name: Yell Gel apply small amount to clitoral area 30 minutes before intercourse (Foster not taking: Reported on 04/05/2016)  . [DISCONTINUED] estradiol (CLIMARA - DOSED IN MG/24 HR) 0.025 mg/24hr patch Place 0.025 mg onto Autumn skin once a week. Reported on 04/05/2016   Facility-Administered Encounter Medications as of 06/08/2016  Medication  . gadopentetate dimeglumine (MAGNEVIST) injection 15 mL  :  Review of Systems:  Out of a complete 14 point review of systems, all are reviewed and negative with Autumn exception of these symptoms as listed below: Review of Systems  Neurological:       Foster states that Autumn Foster dizziness is worse.  Autumn Foster feels like there are bands around Autumn Foster legs, arms, and torso.  Easily fatigued.  Neck stiffness that affects Autumn Foster facial muscles at times.  Increased difficulty in walking, feels like Autumn Foster has to think about leg/foot placement.  Reports syncopal episode on 05/30/16.    Psychiatric/Behavioral:       Feels like Autumn Foster is unable to exercise or go for walks.  Increased depression.     Objective:  Neurologic Exam  Physical Exam Physical Examination:   Vitals:   06/08/16 1302  BP: 104/62  Pulse: 78  Resp: 16   General Examination: Autumn Foster is a very pleasant 50 y.o. female in no acute distress. Autumn Foster appears well-developed and well-nourished and very well groomed. Slightly anxious appearing.   HEENT: Normocephalic, atraumatic, pupils are equal,  round and reactive to light and accommodation. Funduscopic exam is normal with sharp disc margins noted. Extraocular tracking is good without limitation to gaze excursion or nystagmus noted. Normal smooth pursuit is noted. Hearing is grossly intact. Face is symmetric with normal facial animation and normal facial  sensation. Speech is clear with no dysarthria noted. There is no hypophonia. There is no lip, neck/head, jaw or voice tremor. Neck is supple with full range of passive and active motion. There are no carotid bruits on auscultation. Oropharynx exam reveals: no mouth dryness, good dental hygiene and mild airway crowding, due to prominent uvula and thicker soft palate. Mallampati is class Autumn Foster. Tongue protrudes centrally and palate elevates symmetrically.   Chest: Clear to auscultation without wheezing, rhonchi or crackles noted.  Heart: S1+S2+0, regular and normal without murmurs, rubs or gallops noted.   Abdomen: Soft, non-tender and non-distended with normal bowel sounds appreciated on auscultation.  Extremities: There is no pitting edema in Autumn distal lower extremities bilaterally. Pedal pulses are intact.  Skin: Warm and dry without trophic changes noted. There are no varicose veins.  Musculoskeletal: exam reveals no obvious joint deformities, tenderness or joint swelling or erythema.   Neurologically:  Mental status: Autumn Foster is awake, alert and oriented in all 4 spheres. Autumn Foster immediate and remote memory, attention, language skills and fund of knowledge are appropriate. There is no evidence of aphasia, agnosia, apraxia or anomia. Speech is clear with normal prosody and enunciation. Thought process is linear. Mood is normal and affect is normal.   On 03/31/2016: MOCA: 28/30, CDT: 4/4, category fluency: 22/min.  Cranial nerves II - XII are as described above under HEENT exam. In addition: shoulder shrug is normal with equal shoulder height noted. Motor exam: Normal bulk, strength and tone  is noted. There is no drift, tremor or rebound. No fasciculations, no atrophy is noted. Romberg is negative. Reflexes are 2+ throughout. Babinski: Toes are flexor bilaterally. Fine motor skills and coordination: intact with normal finger taps, normal hand movements, normal rapid alternating patting, normal foot taps and normal foot agility.  Cerebellar testing: No dysmetria or intention tremor on finger to nose testing. Heel to shin is unremarkable bilaterally. There is no truncal or gait ataxia.  Sensory exam: intact to light touch, pinprick, vibration, temperature sense in Autumn upper and lower extremities.  Gait, station and balance: Autumn Foster stands easily. No veering to one side is noted. No leaning to one side is noted. Posture is age-appropriate and stance is narrow based. Gait shows normal stride length and normal pace. No problems turning are noted. Tandem walk is unremarkable.   Assessment and Plan:    In summary, TIMIA CASSELMAN is a very pleasant 50 year old female with an underlying medical history of osteopenia, anxiety, HPV infection, cervical dysplasia, depression, anemia, and a prior diagnosis of Lyme disease, for which Autumn Foster was treated with antibiotics about 8-9 years ago, also treatment of [facility] spotted fever several years ago with antibiotics, history of multiple tick bites and also diagnosis of chronic Lyme, who presents for follow-up consultation of Autumn Foster cognitive complaints, and reports additional symptoms such as dizziness, fatigue, lightheadedness, subjective muscle weakness, low stamina. Neurological exam is nonfocal. Workup included blood work, brain MRI with and without contrast and EEG, all of which were benign. We talked about Autumn Foster test results today and Autumn Foster was reassured about Autumn Foster ongoing nonfocal neurological exam. Autumn Foster suggested additional testing in Autumn form of EMG and nerve conduction testing, cervical spine MRI and muscle enzymes with blood work. We mutually agreed to proceed  with muscle enzymes first and take it from there. If these are normal, we will monitor symptoms and played by ear for now.  We talked about maintaining a healthy lifestyle, eating a balanced diet, exercising in moderation  and building up exercise slowly. Autumn Foster has been doing yoga. We may consider formal cognitive evaluation, if Autumn Foster should have flareup of cognitive complaints but for now Autumn Foster feels stable or slightly improved in that regard. At this juncture, Autumn Foster suggested as needed follow-up. Autumn Foster answered all Autumn Foster questions today and Autumn Foster was in agreement. Autumn Foster spent 25 minutes in total face-to-face time with Autumn Foster, more than 50% of which was spent in counseling and coordination of care, reviewing test results, reviewing medication and discussing or reviewing Autumn diagnosis of dizziness, fatigue and cognitive complaints, Autumn prognosis and treatment options.

## 2016-06-09 ENCOUNTER — Encounter: Payer: Self-pay | Admitting: Cardiology

## 2016-06-09 LAB — ALDOLASE: Aldolase: 3.6 U/L (ref 3.3–10.3)

## 2016-06-09 LAB — CK: Total CK: 50 U/L (ref 24–173)

## 2016-06-09 NOTE — Progress Notes (Signed)
Muscle enzymes were normal, please notify patient. As discussed, I can see her back on an as-needed basis at this time, would be happy to order EMG and nerve conduction testing as discussed, which is the electrical nerve and muscle test.  [name], MD, PhD [facility] ([place])

## 2016-06-14 ENCOUNTER — Telehealth: Payer: Self-pay

## 2016-06-14 NOTE — Telephone Encounter (Signed)
I spoke to patient and she is aware of results and recommendations. She states that she wants to "persue a different avenue first" then call us back if she would like to proceed with EMG/NCV testing.

## 2016-06-14 NOTE — Telephone Encounter (Signed)
Confirmed with patient she does not want to try a new medication at this time. She had already called and cancelled OV later this week.  She was grateful for follow-up.

## 2016-06-14 NOTE — Telephone Encounter (Signed)
-----   Message from [name], MD sent at 06/09/2016  2:12 PM EDT ----- Muscle enzymes were normal, please notify patient. As discussed, I can see her back on an as-needed basis at this time, would be happy to order EMG and nerve conduction testing as discussed, which is the electrical nerve and muscle test.  [name], MD, PhD [facility] ([place])

## 2016-06-17 ENCOUNTER — Ambulatory Visit: Payer: BLUE CROSS/BLUE SHIELD | Admitting: Cardiology

## 2016-07-22 ENCOUNTER — Ambulatory Visit: Payer: BLUE CROSS/BLUE SHIELD | Admitting: Neurology

## 2016-09-15 ENCOUNTER — Ambulatory Visit: Payer: BLUE CROSS/BLUE SHIELD | Admitting: Neurology

## 2016-10-21 ENCOUNTER — Telehealth: Payer: Self-pay | Admitting: Neurology

## 2016-10-21 NOTE — Telephone Encounter (Signed)
I called pt and advised her that there are no sooner appts than her current appt on 11/11/16 but that I would keep an eye out for a cancellation and call her if one becomes available. Pt verbalized understanding.

## 2016-10-21 NOTE — Telephone Encounter (Signed)
Patient reports new symptoms - at rest she feels a vibration sensation all over the body mostly arms and legs. The twitching starts when she is trying to fall asleep. She will feel twitching when swallowing,in the legs, hands, arms, shoulders and then little twitches in random places on the body-hands,ams, buttocks. The symptoms started about 2 weeks ago progressively getting worse. The pt has an appointment on 11/11/16 but would like to be seen sooner. Advised that the nurse would call.

## 2016-10-28 NOTE — Telephone Encounter (Signed)
I called pt and offered her a sooner appt on 11/01/16 at 1:00pm but pt declined, saying that she has an appt at 1:30pm in [place] that day. I advised her that we can keep looking. Pt verbalized understanding.

## 2016-10-29 ENCOUNTER — Encounter: Payer: Self-pay | Admitting: Gastroenterology

## 2016-11-01 NOTE — Telephone Encounter (Signed)
I called Autumn Foster and she was able to take appt this Wednesday.

## 2016-11-03 ENCOUNTER — Encounter: Payer: Self-pay | Admitting: Neurology

## 2016-11-03 ENCOUNTER — Ambulatory Visit (INDEPENDENT_AMBULATORY_CARE_PROVIDER_SITE_OTHER): Payer: BLUE CROSS/BLUE SHIELD | Admitting: Neurology

## 2016-11-03 VITALS — BP 110/62 | HR 86 | Resp 16 | Ht 72.0 in | Wt 147.0 lb

## 2016-11-03 DIAGNOSIS — R251 Tremor, unspecified: Secondary | ICD-10-CM | POA: Diagnosis not present

## 2016-11-03 DIAGNOSIS — R253 Fasciculation: Secondary | ICD-10-CM | POA: Diagnosis not present

## 2016-11-03 DIAGNOSIS — R419 Unspecified symptoms and signs involving cognitive functions and awareness: Secondary | ICD-10-CM

## 2016-11-03 NOTE — Progress Notes (Signed)
Subjective:    Patient ID: Autumn Foster is a 52 y.o. female.  HPI     Interim history:   Autumn Foster is a 51 year old right-handed woman with an underlying medical history of osteopenia, anxiety, HPV infection, cervical dysplasia, depression, anemia, and a prior diagnosis of Lyme disease (for which she was treated with antibiotics about 8-9 years ago), also Dx and Rx of RMSF several years ago with antibiotics, history of multiple tick bites and also diagnosis of chronic Lyme, who presents for a new problem of tremors, felt primarily in the inside. I last saw her on 06/08/2016, at which time we talked about recent test results including blood work, brain MRI and EEG results, all of which were benign, I also added additional labs at the time which were unremarkable including CK level and aldolase. As far as her cognitive complaints, she felt stable, perhaps a little better even.   Today, 11/03/2016: She reports internal tremors, and twitching of her muscles in her hands, arms and legs at times. She has been seeing an alternative medication doctor and has been on a one week trial of Mexitil. She was started on Zoloft by a psychiatrist and this was increased to 50 mg, but patient reduced it back to 25 mg, is taking Zn 50 mg per day. She is worried about having PD, has had SIs, but not recently, is going to see a Social worker. She feels her cognitive complaints have flared up, having problems With focus, multitasking, feels that she is easily distracted by noises.  Previously:   I first met her on 03/31/2016 at the request of her primary care physician, at which time the patient reported mental fogginess for the past 6 months or longer.    Neurologically exam was nonfocal. I did suggest we proceed with additional testing including blood work, brain MRI and EEG. We talked about potentially pursuing formal neuropsychological evaluation in the near future. Her MOCA: 28/30, CDT: 4/4, category fluency: 22/min  at the time.   Blood work from 03/31/2016 showed normal A1c, normal vitamin D level, negative ANA, negative rheumatoid factor, negative heavy metals profile, normal vitamin B1, nonreactive RPR nonreactive HIV testing, normal urinalysis, normal B12 and folate, normal CMP, vitamin B6 was elevated at over 100 and she was advised to stop any vitamin B6 supplements.   She had a brain MRI with and without contrast on 04/14/2016 which I reviewed: IMPRESSION:  This is a normal MRI of the brain with and without contrast.   We called her with her test results.    She had an EEG on 04/26/2016 which I reviewed: This is a normal EEG recording in the waking and drowsy state. No evidence of ictal or interictal discharges are seen.   We called her with her test results.   03/31/2016: She reports mental fogginess for the past 6+ months, feels it is progressive, but more subjective, a feeling of disorientation and global weakness, as well as feeling of disorientation, staring off into space, does not lose time. No hx of seizures, epilepsy, migraines, denies TIA Sx.  No family history of dementia with the exception that paternal grandmother had dementia in her late 63s and lived to be admitted 52s. Father is 3 years old and has a history of hypertension and TIAs. She reports a feeling of dizziness, no vertigo. She feels some head tightness but no headaches. She lacks stamina. She has been very physically fit for many years and has even run marathons. She exercises  regularly but feels that in the past 5-6 years her stamina has declined. She feels physically exhausted and symptoms are exacerbated by exercise or post exercise she feels more weak and physically drained. She recently saw on 03/19/2016 an allergy specialist at [facility]. She had additional blood work which I reviewed through [facility]. Normal CRP, normal TSH, normal free T4, normal ESR. Immunology workup showed what I believe allergy to one type of grass and  Juniper. She has a history of orthostatic hypotension. She was recently seen by cardiology on 03/23/2016. I reviewed the note from Ms. Dorene Ar, NP. Patient was advised to drink more water and add Gatorade and consider compression stockings and addition of Florinef in the future was discussed as well. I reviewed your office note from 03/17/2016. You have referred her to immunology in addition to cardiology. She has had recent lab work in your office which we will request. She has been seeing Dr. Wynona Meals at [facility] and has been on hormone therapy, had extensive blood work through that office as well, but test results are not available for my review today.   She denies any significant anxiety or depression, has no significant stress, is satisfied with her life. Has been married for 24 years. She has 3 children ages 74, 68 and 89. She does not always sleep well. Very occasionally will she take a lorazepam at night. She has not taken it regularly by any means. She drinks alcohol occasionally, is a nonsmoker, drinks 1 cup of coffee a day. Nor snoring or apneas are reported.  Her Past Medical History Is Significant For: Past Medical History:  Diagnosis Date  . Anemia   . Anxiety   . ASCUS on Pap smear   . Bacterial infection   . Bladder spasm   . Blood in stool   . Candida vaginitis   . Cervical dysplasia   . Colon polyp    tubular adenoma  . Depression   . Dizziness 04/05/2016  . Dysmenorrhea   . Endometrial polyp   . Genital warts   . Hemorrhoids   . Herpes   . History of chicken pox   . HPV (human papilloma virus) infection   . Low blood pressure   . Lyme disease   . Malignancy ([place])   . Menorrhagia   . Orthostatic hypotension 04/05/2016  . Osteopenia   . Ovarian cyst   . PMDD (premenstrual dysphoric disorder)   . POTS (postural orthostatic tachycardia syndrome) 04/05/2016  . Rectal bleed   . Urinary frequency   . Urine incontinence   . Yeast infection     Her Past  Surgical History Is Significant For: Past Surgical History:  Procedure Laterality Date  . COLONOSCOPY  12/29/2010   2 sessile polyps Dr. Fuller Plan  . DILATION AND CURETTAGE OF UTERUS    . HYSTEROSCOPY      Her Family History Is Significant For: Family History  Problem Relation Age of Onset  . Thyroid disease Maternal Grandmother   . Prostate cancer Father   . Hypertension Father   . Diabetes Maternal Grandfather   . Migraines Mother   . Renal cancer Father   . Alcohol abuse Mother   . Suicidality Mother     deceased  . Colon cancer Paternal Grandfather   . Dementia Paternal Grandmother   . Rheum arthritis Maternal Grandmother   . Anxiety disorder Sister     x 2  . Diabetes Sister     prediabeties    Her  Social History Is Significant For: Social History   Social History  . Marital status: Married    Spouse name: Preston   . Number of children: 3  . Years of education: college   Social History Main Topics  . Smoking status: Never Smoker  . Smokeless tobacco: Never Used  . Alcohol use 0.0 oz/week     Comment: socially   . Drug use: No  . Sexual activity: Yes     Comment: vasectony   Other Topics Concern  . None   Social History Narrative   Drinks 1 cup of coffee a day     Her Allergies Are:  Allergies  Allergen Reactions  . Doxycycline     Fatigue  . Lexapro [Escitalopram Oxalate] Other (See Comments)    Insomnia   . Pristiq [Desvenlafaxine]     Dry heaves/dizzy  . Prozac [Fluoxetine Hcl]     HA and Strange Dreams  . Septra Ds [Sulfamethoxazole-Trimethoprim]     Jitteriness/hyper/affecting sleep  . Penicillins Rash    Childhood rash  :   Her Current Medications Are:  Outpatient Encounter Prescriptions as of 11/03/2016  Medication Sig  . Ascorbic Acid (VITAMIN C) 1000 MG tablet Take 1,000 mg by mouth daily.  . cholecalciferol (VITAMIN D) 1000 UNITS tablet Take 4,000 Units by mouth daily.   . Coenzyme Q10 (CO Q 10 PO) Take 100 mg by mouth as directed.    . fish oil-omega-3 fatty acids 1000 MG capsule Take 2 g by mouth daily.  . GLYCINE PO Take by mouth.  . LORazepam (ATIVAN) 0.5 MG tablet Take 0.5 mg by mouth every 8 (eight) hours.  . MAGNESIUM PO Take 1 tablet by mouth daily. 400-800 MG as directed Reported on 04/05/2016  . mexiletine (MEXITIL) 150 MG capsule TAKE 1 CAPSULE DAILY IN THE MORNING  . Probiotic Product (ACIDOPHILUS PROBIOTIC BLEND) CAPS Take 1 capsule by mouth daily.  . progesterone (PROMETRIUM) 100 MG capsule Take 100 mg by mouth daily.  . sertraline (ZOLOFT) 25 MG tablet TAKE 1 AND 1/2 TABLET BY MOUTH DAILY IN THE MORNING  . TESTOSTERONE BU Apply topically.   . [DISCONTINUED] estradiol (VIVELLE-DOT) 0.0375 MG/24HR APPLY EVERY 3 DAYS.  . [DISCONTINUED] fludrocortisone (FLORINEF) 0.1 MG tablet   . [DISCONTINUED] niacin 250 MG tablet Take 250 mg by mouth at bedtime.   Facility-Administered Encounter Medications as of 11/03/2016  Medication  . gadopentetate dimeglumine (MAGNEVIST) injection 15 mL  :  Review of Systems:  Out of a complete 14 point review of systems, all are reviewed and negative with the exception of these symptoms as listed below:  Review of Systems  Neurological:        Patient states that she is having new Sx of "internal tremors and spasms". Weakness in arms. Tightness in leg muscles. Feelings of a "band" around her abdomen. Reported brain fog and vision changes. Recently went to eye doctor, updated prescription.      Objective:  Neurologic Exam  Physical Exam Physical Examination:   Vitals:   11/03/16 0845  BP: 110/62  Pulse: 86  Resp: 16   General Examination: The patient is a very pleasant 50 y.o. female in no acute distress. She appears well-developed and well-nourished and very well groomed. Anxious appearing and tearful today.   HEENT: Normocephalic, atraumatic, pupils are equal, round and reactive to light and accommodation. Funduscopic exam is normal with sharp disc margins noted.  Extraocular tracking is good without limitation to gaze excursion or nystagmus noted. Normal   smooth pursuit is noted. Hearing is grossly intact. Face is symmetric with normal facial animation and normal facial sensation. Speech is clear with no dysarthria noted. There is no hypophonia. There is no lip, neck/head, jaw or voice tremor. Neck is supple with full range of passive and active motion. There are no carotid bruits on auscultation. Oropharynx exam reveals: no mouth dryness, good dental hygiene and mild airway crowding, due to prominent uvula and thicker soft palate. Mallampati is class I. Tongue protrudes centrally and palate elevates symmetrically. No changes noted from before.   Chest: Clear to auscultation without wheezing, rhonchi or crackles noted.  Heart: S1+S2+0, regular and normal without murmurs, rubs or gallops noted.   Abdomen: Soft, non-tender and non-distended with normal bowel sounds appreciated on auscultation.  Extremities: There is no pitting edema in the distal lower extremities bilaterally. Pedal pulses are intact.  Skin: Warm and dry without trophic changes noted. There are no varicose veins.  Musculoskeletal: exam reveals no obvious joint deformities, tenderness or joint swelling or erythema.   Neurologically:  Mental status: The patient is awake, alert and oriented in all 4 spheres. Her immediate and remote memory, attention, language skills and fund of knowledge are appropriate. There is no evidence of aphasia, agnosia, apraxia or anomia. Speech is clear with normal prosody and enunciation. Thought process is linear. Mood is normal and affect is normal.   On 03/31/2016: MOCA: 28/30, CDT: 4/4, category fluency: 22/min.  Cranial nerves II - XII are as described above under HEENT exam. In addition: shoulder shrug is normal with equal shoulder height noted. Motor exam: Normal bulk, strength and tone is noted. No twitching notes, no atrophy, his overall slender but good  strength, no postural, action or intention tremor. No resting tremor. Romberg is negative. Reflexes are 2+ throughout. Fine motor skills and coordination: intact with normal finger taps, normal hand movements, normal rapid alternating patting, normal foot taps and normal foot agility.  Cerebellar testing: No dysmetria or intention tremor on finger to nose testing. Heel to shin is unremarkable bilaterally. There is no truncal or gait ataxia.  Sensory exam: intact to light touch in the upper and lower extremities.  Gait, station and balance: She stands easily. No veering to one side is noted. No leaning to one side is noted. Posture is age-appropriate and stance is narrow based. Gait shows normal stride length and normal pace. She has preserved arm swing bilaterally. No problems turning are noted. Tandem walk is unremarkable.   Assessment and Plan:    In summary, Autumn Foster is a very pleasant 51 year old female with an underlying medical history of osteopenia, anxiety, HPV infection, cervical dysplasia, depression, anemia, and a prior diagnosis of Lyme disease, for which she was treated with antibiotics about 8-9 years ago, also treatment of [facility] spotted fever several years ago with antibiotics, history of multiple tick bites and also diagnosis of chronic Lyme, who presents for a new problem of internal tremors and muscle twitching. She feels that her cognitive issues have flared up again. She has been seeing a psychiatrist and is on low-dose sertraline. She had suicidal ideation in the past but denies any currently. She is going to start seeing a Social worker. She has been seen an alternative medicine specialist and has been on a trial of a new medication. She is going to finish the trial this week. She has also been on zinc. She feels that her zinc levels are not increasing enough. She is cautioned about taking  excessive zinc as it can cause toxicity and problems. She feels that she is resting well  enough and drinking enough water. She has eliminated caffeine. Her exam continues to be nonfocal and she is reassured that I did not see any signs of parkinsonism in particular. She worries about having a possible neurodegenerative disease. She is depressed, quite tearful in fact today. She is strongly advised to pursue counseling and from my end of things I suggested we proceed with EMG and nerve conduction testing as we previously discussed this and I also suggested we proceed with full cognitive testing throughout license neuropsychologist. She's agreeable. Furthermore, I suggested proceeding with a second opinion with a comprehensive neurological center such as at [facility] or [facility] or [facility] as I may or may not have anything else to offer her at this point. Nevertheless, at this point we will proceed with EMG and nerve conduction testing and cognitive testing through neuropsychology and call her with her test results. I did try to reassure her that her exam continues to be benign.  I answered all her questions today and she was in agreement. I spent 25 minutes in total face-to-face time with the patient, more than 50% of which was spent in counseling and coordination of care, reviewing test results, reviewing medication and discussing or reviewing the diagnosis of dizziness, fatigue and cognitive complaints, the prognosis and treatment options. 

## 2016-11-03 NOTE — Patient Instructions (Addendum)
We will do an EMG and nerve conduction velocity test, which is an electrical nerve and muscle test, which we will schedule. We will call you with the results. We will do a formal neuropsychological test (aka cognitive testing) for your memory complaints. This requires a referral to a trained and licensed neuropsychologist and will be a separate appointment at a different clinic.  We will consider a second opinion at [facility] or [facility] or [facility]. Your exam does not suggest parkinsonism.

## 2016-11-08 ENCOUNTER — Encounter: Payer: BLUE CROSS/BLUE SHIELD | Admitting: Psychology

## 2016-11-11 ENCOUNTER — Ambulatory Visit: Payer: BLUE CROSS/BLUE SHIELD | Admitting: Neurology

## 2016-12-16 ENCOUNTER — Encounter: Payer: Self-pay | Admitting: Psychology

## 2016-12-16 ENCOUNTER — Ambulatory Visit (INDEPENDENT_AMBULATORY_CARE_PROVIDER_SITE_OTHER): Payer: BLUE CROSS/BLUE SHIELD | Admitting: Psychology

## 2016-12-16 DIAGNOSIS — R419 Unspecified symptoms and signs involving cognitive functions and awareness: Secondary | ICD-10-CM

## 2016-12-16 DIAGNOSIS — F339 Major depressive disorder, recurrent, unspecified: Secondary | ICD-10-CM
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
## 2016-12-20 ENCOUNTER — Ambulatory Visit (INDEPENDENT_AMBULATORY_CARE_PROVIDER_SITE_OTHER): Payer: Self-pay | Admitting: Neurology

## 2016-12-20 ENCOUNTER — Telehealth: Payer: Self-pay

## 2016-12-20 ENCOUNTER — Ambulatory Visit (INDEPENDENT_AMBULATORY_CARE_PROVIDER_SITE_OTHER): Payer: BLUE CROSS/BLUE SHIELD | Admitting: Neurology

## 2016-12-20 ENCOUNTER — Encounter: Payer: Self-pay | Admitting: Neurology

## 2016-12-20 DIAGNOSIS — R251 Tremor, unspecified: Secondary | ICD-10-CM

## 2016-12-20 DIAGNOSIS — R419 Unspecified symptoms and signs involving cognitive functions and awareness: Secondary | ICD-10-CM

## 2016-12-20 DIAGNOSIS — R253 Fasciculation: Secondary | ICD-10-CM

## 2016-12-20 DIAGNOSIS — R42 Dizziness and giddiness: Secondary | ICD-10-CM

## 2016-12-20 NOTE — Progress Notes (Signed)
Please refer to EMG and nerve conduction study procedure note. 

## 2016-12-20 NOTE — Telephone Encounter (Signed)
I called pt to discuss her NCV/EMG results. No answer, left a message asking her to call me back.

## 2016-12-20 NOTE — Procedures (Signed)
     HISTORY:  Kathi Berrier is a 51 year old patient with a history since December 2017 of muscle twitches and occasional cramps involving all 4 extremities, more so in the feet and hands. The patient denies any true weakness of the extremities, but she does have a sensation of heaviness of the arms and legs.   NERVE CONDUCTION STUDIES:  Nerve conduction studies were performed on the right upper extremity. The distal motor latencies and motor amplitudes for the median and ulnar nerves were normal, as were the nerve conduction velocities for these nerves. The right median and ulnar sensory latencies were within normal limits. The F wave latencies for the right median nerve was normal.  Nerve conduction studies done on the left lower extremity shows normal distal motor latencies and motor amplitudes for the left peroneal and posterior tibial nerves with normal nerve conduction velocities seen for these nerves. The F wave latencies for the left peroneal and posterior tibial nerves were normal. The H reflex latency on the left was normal. The sensory latency for the left peroneal nerve was normal.   EMG STUDIES:  EMG study was performed on the right upper extremity:  The first dorsal interosseous muscle reveals 2 to 4 K units with full recruitment. No fibrillations or positive waves were noted. The abductor pollicis brevis muscle reveals 2 to 4 K units with full recruitment. No fibrillations or positive waves were noted. The extensor indicis proprius muscle reveals 1 to 3 K units with full recruitment. No fibrillations or positive waves were noted. The biceps muscle reveals 1 to 2 K units with full recruitment. No fibrillations or positive waves were noted. The triceps muscle reveals 2 to 4 K units with full recruitment. No fibrillations or positive waves were noted. The anterior deltoid muscle reveals 2 to 3 K units with full recruitment. No fibrillations or positive waves were noted. The  cervical paraspinal muscles were not tested  EMG study was performed on the left lower extremity:  The tibialis anterior muscle reveals 2 to 4K motor units with full recruitment. No fibrillations or positive waves were seen. The peroneus tertius muscle reveals 2 to 4K motor units with full recruitment. No fibrillations or positive waves were seen. The medial gastrocnemius muscle reveals 1 to 3K motor units with full recruitment. No fibrillations or positive waves were seen. The vastus lateralis muscle reveals 2 to 4K motor units with full recruitment. No fibrillations or positive waves were seen. The iliopsoas muscle reveals 2 to 4K motor units with full recruitment. No fibrillations or positive waves were seen. The biceps femoris muscle (long head) reveals 2 to 4K motor units with full recruitment. No fibrillations or positive waves were seen. The lumbosacral paraspinal muscles were tested at 3 levels, and revealed no abnormalities of insertional activity at all 3 levels tested. There was good relaxation.   IMPRESSION:  Nerve conduction studies done on the right upper extremity and on the left lower extremity were within normal limits, no evidence of a neuropathy is seen. EMG study on the right arm and right leg were unremarkable, no evidence of a radiculopathy or evidence of a myopathic disorder is noted. No evidence of an anterior horn cell disease process was seen. Fasciculations were not seen in any muscles tested.  Jill Alexanders MD 12/20/2016 12:54 PM  [facility] [address] [place], [place] [address]  Phone [phone] Fax [phone]

## 2016-12-20 NOTE — Progress Notes (Signed)
Please call and advise the patient that the recent EMG and nerve conduction velocity test, which is the electrical nerve and muscle test we we performed, was reported as within normal limits. We checked for abnormal electrical discharges in the muscles or nerves and the report suggested normal findings. No further action is required on this test at this time.  Thanks,  [name], MD, PhD

## 2016-12-20 NOTE — Telephone Encounter (Signed)
-----   Message from [name], MD sent at 12/20/2016  1:12 PM EST ----- Please call and advise the patient that the recent EMG and nerve conduction velocity test, which is the electrical nerve and muscle test we we performed, was reported as within normal limits. We checked for abnormal electrical discharges in the muscles or nerves and the report suggested normal findings. No further action is required on this test at this time.  Thanks,  [name], MD, PhD

## 2016-12-21 NOTE — Telephone Encounter (Signed)
I called pt and advised her of her results. Pt verbalized understanding of results. Pt had no questions at this time but was encouraged to call back if questions arise. Pt says that she will call back in a few weeks to make a follow up appt with Dr. Rexene Alberts. The muscle twitching has not been as bad, per pt.

## 2016-12-24 ENCOUNTER — Ambulatory Visit (AMBULATORY_SURGERY_CENTER): Payer: Self-pay

## 2016-12-24 VITALS — Ht 72.0 in | Wt 145.0 lb

## 2016-12-24 DIAGNOSIS — Z860100 Personal history of colon polyps, unspecified: Secondary | ICD-10-CM

## 2016-12-24 MED ORDER — SUPREP BOWEL PREP KIT 17.5-3.13-1.6 GM/177ML PO SOLN: 1.0000 | Freq: Once | ORAL | 0 refills | Status: AC

## 2016-12-24 NOTE — Progress Notes (Signed)
No diet meds No home oxygen No past problems with anesthesia No allergies to eggs or soy  Declined emmi 

## 2016-12-28 ENCOUNTER — Encounter: Payer: Self-pay | Admitting: Gastroenterology

## 2017-01-06 ENCOUNTER — Telehealth: Payer: Self-pay | Admitting: Psychology

## 2017-01-06 NOTE — Progress Notes (Signed)
Patient called earlier this week while I was out of the office and spoke with my testing technician. Patient reported she was concerned about whether she should keep her testing appointment on 3/19 due to significantly increased depression. My technician advised that I would call the patient to discuss further. I called patient on 3/13 and LVM. I also called earlier this morning and LVM. Patient returned my call and we spoke for 15 minutes. Patient reported significantly increased depression and endorsed suicidal ideation over the weekend but denied suicidal ideation or intention at the present time. She knows to tell her husband and go to ER if suicidal thoughts/intention returns. She does feel she needs medication for depression at this point. She asked for recommendations for psychiatrist and I provided two names. She will contact their offices. She expressed concern that neurocognitive testing session on 3/19 may not yield valid results given that she is so depressed she cannot concentrate on anything, and I agreed. She does wish to get the testing completed as soon as she is more stable emotionally. I agreed we should reschedule her testing session at least 2-3 weeks out from now. Front desk will assist her with this.

## 2017-01-07 ENCOUNTER — Encounter: Payer: Self-pay | Admitting: Gastroenterology

## 2017-01-07 ENCOUNTER — Ambulatory Visit (AMBULATORY_SURGERY_CENTER): Payer: BLUE CROSS/BLUE SHIELD | Admitting: Gastroenterology

## 2017-01-07 VITALS — BP 85/50 | HR 61 | Temp 98.4°F | Resp 15 | Ht 72.0 in | Wt 145.0 lb

## 2017-01-07 DIAGNOSIS — Z86010 Personal history of colonic polyps: Secondary | ICD-10-CM

## 2017-01-07 DIAGNOSIS — D128 Benign neoplasm of rectum: Secondary | ICD-10-CM | POA: Diagnosis not present

## 2017-01-07 DIAGNOSIS — D129 Benign neoplasm of anus and anal canal: Secondary | ICD-10-CM

## 2017-01-07 MED ORDER — SODIUM CHLORIDE 0.9 % IV SOLN: 500.0000 mL | INTRAVENOUS | Status: DC

## 2017-01-07 NOTE — Progress Notes (Signed)
Called to room to assist during endoscopic procedure.  Patient ID and intended procedure confirmed with present staff. Received instructions for my participation in the procedure from the performing physician.  

## 2017-01-07 NOTE — Progress Notes (Signed)
Report to PACU, RN, vss, BBS= Clear.  

## 2017-01-07 NOTE — Op Note (Signed)
[facility] Patient Name: Autumn Foster Procedure Date: 01/07/2017 10:44 AM MRN: [id number] Endoscopist: Ladene Artist , MD Age: 51 Referring MD:  Date of Birth: 12/23/1965 Gender: Female Account #: [id number] Procedure:                Colonoscopy Indications:              Surveillance: Personal history of adenomatous                            polyps on last colonoscopy > 5 years ago Medicines:                Monitored Anesthesia Care Procedure:                Pre-Anesthesia Assessment:                           - Prior to the procedure, a History and Physical                            was performed, and patient medications and                            allergies were reviewed. The patient's tolerance of                            previous anesthesia was also reviewed. The risks                            and benefits of the procedure and the sedation                            options and risks were discussed with the patient.                            All questions were answered, and informed consent                            was obtained. Prior Anticoagulants: The patient has                            taken no previous anticoagulant or antiplatelet                            agents. ASA Grade Assessment: II - A patient with                            mild systemic disease. After reviewing the risks                            and benefits, the patient was deemed in                            satisfactory condition to undergo the procedure.  After obtaining informed consent, the colonoscope                            was passed under direct vision. Throughout the                            procedure, the patient's blood pressure, pulse, and                            oxygen saturations were monitored continuously. The                            Colonoscope was introduced through the anus and                            advanced to the the  cecum, identified by                            appendiceal orifice and ileocecal valve. The                            ileocecal valve, appendiceal orifice, and rectum                            were photographed. The quality of the bowel                            preparation was excellent. The colonoscopy was                            performed without difficulty. The patient tolerated                            the procedure well. Scope In: 10:55:55 AM Scope Out: 11:13:07 AM Scope Withdrawal Time: 0 hours 12 minutes 32 seconds  Total Procedure Duration: 0 hours 17 minutes 12 seconds  Findings:                 The perianal and digital rectal examinations were                            normal.                           A 6 mm polyp was found in the rectum. The polyp was                            sessile. The polyp was removed with a cold snare.                            Resection and retrieval were complete.                           The exam was otherwise without abnormality on  direct and retroflexion views. Complications:            No immediate complications. Estimated blood loss:                            None. Estimated Blood Loss:     Estimated blood loss: none. Impression:               - One 6 mm polyp in the rectum, removed with a cold                            snare. Resected and retrieved.                           - The examination was otherwise normal on direct                            and retroflexion views. Recommendation:           - Repeat colonoscopy in 5 years for surveillance.                           - Patient has a contact number available for                            emergencies. The signs and symptoms of potential                            delayed complications were discussed with the                            patient. Return to normal activities tomorrow.                            Written discharge instructions were  provided to the                            patient.                           - Resume previous diet.                           - Continue present medications.                           - Await pathology results. Ladene Artist, MD 01/07/2017 11:15:13 AM This report has been signed electronically.

## 2017-01-07 NOTE — Patient Instructions (Signed)
YOU HAD AN ENDOSCOPIC PROCEDURE TODAY AT THE [facility]:   Refer to the procedure report that was given to you for any specific questions about what was found during the examination.  If the procedure report does not answer your questions, please call your gastroenterologist to clarify.  If you requested that your care partner not be given the details of your procedure findings, then the procedure report has been included in a sealed envelope for you to review at your convenience later.  YOU SHOULD EXPECT: Some feelings of bloating in the abdomen. Passage of more gas than usual.  Walking can help get rid of the air that was put into your GI tract during the procedure and reduce the bloating. If you had a lower endoscopy (such as a colonoscopy or flexible sigmoidoscopy) you may notice spotting of blood in your stool or on the toilet paper. If you underwent a bowel prep for your procedure, you may not have a normal bowel movement for a few days.  Please Note:  You might notice some irritation and congestion in your nose or some drainage.  This is from the oxygen used during your procedure.  There is no need for concern and it should clear up in a day or so.  SYMPTOMS TO REPORT IMMEDIATELY:   Following lower endoscopy (colonoscopy or flexible sigmoidoscopy):  Excessive amounts of blood in the stool  Significant tenderness or worsening of abdominal pains  Swelling of the abdomen that is new, acute  Fever of 100F or higher   For urgent or emergent issues, a gastroenterologist can be reached at any hour by calling [phone].   DIET:  We do recommend a small meal at first, but then you may proceed to your regular diet.  Drink plenty of fluids but you should avoid alcoholic beverages for 24 hours.  ACTIVITY:  You should plan to take it easy for the rest of today and you should NOT DRIVE or use heavy machinery until tomorrow (because of the sedation medicines used during the test).     FOLLOW UP: Our staff will call the number listed on your records the next business day following your procedure to check on you and address any questions or concerns that you may have regarding the information given to you following your procedure. If we do not reach you, we will leave a message.  However, if you are feeling well and you are not experiencing any problems, there is no need to return our call.  We will assume that you have returned to your regular daily activities without incident.  If any biopsies were taken you will be contacted by phone or by letter within the next 1-3 weeks.  Please call us at [phone] if you have not heard about the biopsies in 3 weeks.    SIGNATURES/CONFIDENTIALITY: You and/or your care partner have signed paperwork which will be entered into your electronic medical record.  These signatures attest to the fact that that the information above on your After Visit Summary has been reviewed and is understood.  Full responsibility of the confidentiality of this discharge information lies with you and/or your care-partner.  Read all of the handouts given to you by your recovery room nurse. 

## 2017-01-07 NOTE — Progress Notes (Signed)
Pt's states no medical or surgical changes since previsit or office visit. 

## 2017-01-10 ENCOUNTER — Telehealth: Payer: Self-pay | Admitting: *Deleted

## 2017-01-10 ENCOUNTER — Encounter: Payer: BLUE CROSS/BLUE SHIELD | Admitting: Psychology

## 2017-01-10 NOTE — Telephone Encounter (Signed)
  Follow up Call-  Call back number 01/07/2017  Post procedure Call Back phone  # [phone]  Permission to leave phone message Yes  Some recent data might be hidden     Patient questions:  Do you have a fever, pain , or abdominal swelling? No. Pain Score  0 *  Have you tolerated food without any problems? Yes.    Have you been able to return to your normal activities? Yes.    Do you have any questions about your discharge instructions: Diet   No. Medications  No. Follow up visit  No.  Do you have questions or concerns about your Care? No.  Actions: * If pain score is 4 or above: No action needed, pain <4.

## 2017-01-21 ENCOUNTER — Encounter: Payer: Self-pay | Admitting: Gastroenterology

## 2017-06-20 ENCOUNTER — Encounter: Payer: Self-pay | Admitting: Physician Assistant

## 2017-06-20 ENCOUNTER — Ambulatory Visit (INDEPENDENT_AMBULATORY_CARE_PROVIDER_SITE_OTHER): Payer: BLUE CROSS/BLUE SHIELD | Admitting: Physician Assistant

## 2017-06-20 VITALS — BP 94/50 | HR 76 | Ht 72.0 in | Wt 142.8 lb

## 2017-06-20 DIAGNOSIS — R634 Abnormal weight loss: Secondary | ICD-10-CM | POA: Diagnosis not present

## 2017-06-20 DIAGNOSIS — R140 Abdominal distension (gaseous): Secondary | ICD-10-CM | POA: Diagnosis not present

## 2017-06-20 DIAGNOSIS — R197 Diarrhea, unspecified: Secondary | ICD-10-CM

## 2017-06-20 DIAGNOSIS — R6881 Early satiety: Secondary | ICD-10-CM

## 2017-06-20 DIAGNOSIS — R1013 Epigastric pain: Secondary | ICD-10-CM | POA: Diagnosis not present

## 2017-06-20 NOTE — Progress Notes (Signed)
Reviewed and agree with initial management plan.  Malcolm T. Stark, MD FACG 

## 2017-06-20 NOTE — Patient Instructions (Signed)
You have been scheduled for an endoscopy. Please follow written instructions given to you at your visit today. If you use inhalers (even only as needed), please bring them with you on the day of your procedure. Your physician has requested that you go to www.startemmi.com and enter the access code given to you at your visit today. This web site gives a general overview about your procedure. However, you should still follow specific instructions given to you by our office regarding your preparation for the procedure.  We have given you a low FODMAP diet.

## 2017-06-20 NOTE — Progress Notes (Signed)
Chief Complaint:  Change in bowel habits, epigastric discomfort, bloating, weight loss, decrease in appetite  HPI:  Autumn Foster is a 51 year old Caucasian female with a past medical history of anxiety, depression and multiple others listed below,  who presents to clinic for a complaint of change in bowel habits, epigastric discomfort, bloating, weight loss and decrease in appetite .     Patient is known to Dr. Fuller Plan and did have a recent colonoscopy 01/07/17 with findings of one polyp and an otherwise normal colonoscopy. Recommendations were for repeat in 5 years.   Today, the patient tells me that she has been dealing with depression worse over the past year or 2 since "my health went bad". Patient tells me that prior to this time she was healthy and fit and it seems like in May of last year her body just started to deteriorate. Patient describes muscle aches and joint pains making it unable for her to exercise and also prior to this patient started with an epigastric discomfort that made it somewhat "uncomfortable to the touch at times", and was certainly worse after eating. Patient describes a decrease in appetite due to this pain over the past few years and a weight loss of around 15 pounds in this time. Apparently, patient initially had an ultrasound for the symptoms which we do not have record of. She was seen in our clinic in 2016 by Wallace Going and had a CT of the abdomen with contrast which showed no cause for the pain (LUQ at the time). The patient tells me she has grown weary of her symptoms and her depression has worsened. Additionally the patient was just tried on a new antidepressant, Vibrid, which was started 5 days ago and is experiencing multiple side effects from this medication including hypotension.   Overall the patient describes a radiation from diarrhea to constipation and feels that in general everything has "slowed down". She describes eating and feels as though it sits in her  stomach and makes her feel full with only a few bites. This is associated with a lot of "gurgling and noises". Patient does describe being diagnosed with Lyme disease 2 years ago but now they're "just following this". Patient has also had a CA-125 checked per her recommendations and this was negative for signs of cancer. Associated symptoms included a large amount of bloating.   The patient asks multiple questions today regarding possible gallbladder issues versus IBS versus celiac disease versus other.   Patient denies fever, chills, blood in her stool, melena, change in diet, nausea, vomiting, heartburn, reflux, dysphagia or symptoms that awaken her at night.  Past Medical History:  Diagnosis Date  . Anemia   . Anxiety   . ASCUS on Pap smear   . Bacterial infection   . Bladder spasm   . Blood in stool   . Candida vaginitis   . Cervical dysplasia   . Colon polyp    tubular adenoma  . Depression   . Dizziness 04/05/2016  . Dysmenorrhea   . Endometrial polyp   . Fatigue   . Genital warts   . Hemorrhoids   . Herpes   . History of chicken pox   . HPV (human papilloma virus) infection   . Low blood pressure   . Lyme disease   . Malignancy ([place])   . Menorrhagia   . Orthostatic hypotension 04/05/2016  . Osteopenia   . Ovarian cyst   . PMDD (premenstrual dysphoric disorder)   . POTS (  postural orthostatic tachycardia syndrome) 04/05/2016  . Rectal bleed   . Urinary frequency   . Urine incontinence   . Yeast infection     Past Surgical History:  Procedure Laterality Date  . COLONOSCOPY  12/29/2010   2 sessile polyps Dr. Fuller Plan  . DILATION AND CURETTAGE OF UTERUS    . HYSTEROSCOPY      Current Outpatient Prescriptions  Medication Sig Dispense Refill  . Ascorbic Acid (VITAMIN C) 1000 MG tablet Take 1,000 mg by mouth daily.    . cholecalciferol (VITAMIN D) 1000 UNITS tablet Take 4,000 Units by mouth daily.     . Coenzyme Q10 (CO Q 10 PO) Take 100 mg by mouth as directed.     .  fish oil-omega-3 fatty acids 1000 MG capsule Take 2 g by mouth daily.    Marland Kitchen LORazepam (ATIVAN) 0.5 MG tablet Take 0.5 mg by mouth every 8 (eight) hours as needed.     Marland Kitchen MAGNESIUM PO Take 1 tablet by mouth daily. 400-800 MG as directed Reported on 04/05/2016    . Probiotic Product (ACIDOPHILUS PROBIOTIC BLEND) CAPS Take 1 capsule by mouth daily.    . Zinc 50 MG CAPS Take by mouth.     Current Facility-Administered Medications  Medication Dose Route Frequency Provider Last Rate Last Dose  . 0.9 %  sodium chloride infusion  500 mL Intravenous Continuous Ladene Artist, MD       Facility-Administered Medications Ordered in Other Visits  Medication Dose Route Frequency Provider Last Rate Last Dose  . gadopentetate dimeglumine (MAGNEVIST) injection 15 mL  15 mL Intravenous Once PRN [name], MD        Allergies as of 06/20/2017 - Review Complete 06/20/2017  Allergen Reaction Noted  . Doxycycline  10/15/2013  . Lexapro [escitalopram oxalate] Other (See Comments) 10/15/2013  . Pristiq [desvenlafaxine]  10/15/2013  . Prozac [fluoxetine hcl]  10/15/2013  . Septra ds [sulfamethoxazole-trimethoprim]  10/15/2013  . Penicillins Rash 12/16/2010    Family History  Problem Relation Age of Onset  . Prostate cancer Father   . Hypertension Father   . Renal cancer Father   . Migraines Mother   . Alcohol abuse Mother   . Suicidality Mother        deceased  . Colon cancer Paternal Grandfather   . Dementia Paternal Grandmother   . Thyroid disease Maternal Grandmother   . Rheum arthritis Maternal Grandmother   . Diabetes Maternal Grandfather   . Anxiety disorder Sister        x 2    Social History   Social History  . Marital status: Married    Spouse name: Jaci Standard   . Number of children: 3  . Years of education: college   Occupational History  . Not on file.   Social History Main Topics  . Smoking status: Never Smoker  . Smokeless tobacco: Never Used  . Alcohol use No  . Drug  use: No  . Sexual activity: Yes     Comment: vasectony   Other Topics Concern  . Not on file   Social History Narrative   Drinks 1 cup of coffee a day     Review of Systems:    Constitutional: No fever or chills Skin: No rash Cardiovascular: No chest pain Respiratory: No SOB  Gastrointestinal: See HPI and otherwise negative Genitourinary: No dysuria  Neurological: No headache Musculoskeletal: No new muscle or joint pain Hematologic: No bleeding or bruising Psychiatric: Positive history of depression and anxiety  Physical Exam:  Vital signs: BP (!) 94/50   Pulse 76   Ht 6' (1.829 m)   Wt 142 lb 12.8 oz (64.8 kg)   BMI 19.37 kg/m   Constitutional:   Pleasant Caucasian female appears to be in NAD, Well developed, Well nourished, alert and cooperative Head:  Normocephalic and atraumatic. Eyes:   PEERL, EOMI. No icterus. Conjunctiva pink. Ears:  Normal auditory acuity. Neck:  Supple Throat: Oral cavity and pharynx without inflammation, swelling or lesion.  Respiratory: Respirations even and unlabored. Lungs clear to auscultation bilaterally.   No wheezes, crackles, or rhonchi.  Cardiovascular: Normal S1, S2. No MRG. Regular rate and rhythm. No peripheral edema, cyanosis or pallor.  Gastrointestinal:  Soft, nondistended, Mild epigastric TTP No rebound or guarding. Normal bowel sounds. No appreciable masses or hepatomegaly. Rectal:  Not performed.  Msk:  Symmetrical without gross deformities. Without edema, no deformity or joint abnormality.  Neurologic:  Alert and  oriented x4;  grossly normal neurologically.  Skin:   Dry and intact without significant lesions or rashes. Psychiatric: Demonstrates good judgement and reason without abnormal affect or behaviors.  No recent labs or imaging.  Assessment: 1. Weight loss: Around 15 pounds over the past 2 years, decrease in activity level due to growing depression and muscle aches per patient, recent colonoscopy with 1 polyp and  otherwise normal; consider relation to epigastric discomfort and depression/ muscle mass loss 2. Early satiety: Occurring over the past 2 years, worse recently; likely worsened due to depression and anxiety over recurrent health problems, though must rule out gastritis/duodenitis/H. pylori 3. Diarrhea: Patient describes stools as either very loose or "fluffy", all of the time; Consider SIBO versus IBS versus other 4. Epigastric abdominal pain: Concern for gastritis versus H. pylori versus other 5. Bloating:Ca-125 normal per the patient recently, we do not have records, associated with a lot of epigastric discomfort and a radiation in bowel habits from constipation to diarrhea; likely related to IBS/functional symptoms  Plan: 1. Spent much time in discussion with the patient regarding possible relation of symptoms to IBS and functional dyspepsia, she would like to make sure that there is nothing else going on and specifically would like [place] to consider SIBO and celiac disease. Due to early satiety and epigastric discomfort which has been going on for 2 years now would recommend an EGD. 2. Scheduled patient for an EGD in [place] with Dr. Fuller Plan. Did discuss risks, benefits, limitations and alternatives. 3. Discussed a low FODMAP diet and provided handout 4. Explained to the patient that we could do a trial of Xifaxan as she does have consistently looser than normal stools accompanied by some epigastric discomfort and a history of depression and anxiety, could also trial FD Guard 5. Patient to follow in clinic per recommendations from Dr. Fuller Plan after time of EGD  Ellouise Newer, PA-C [facility] 06/20/2017, 1:08 PM  Cc: Zane Herald, MD

## 2017-06-21 ENCOUNTER — Telehealth (HOSPITAL_COMMUNITY): Payer: Self-pay | Admitting: Professional

## 2017-06-22 ENCOUNTER — Encounter: Payer: Self-pay | Admitting: Gastroenterology

## 2017-06-22 ENCOUNTER — Ambulatory Visit (AMBULATORY_SURGERY_CENTER): Payer: BLUE CROSS/BLUE SHIELD | Admitting: Gastroenterology

## 2017-06-22 VITALS — BP 104/65 | HR 59 | Temp 98.7°F | Resp 15 | Ht 72.0 in | Wt 142.0 lb

## 2017-06-22 DIAGNOSIS — R6881 Early satiety: Secondary | ICD-10-CM

## 2017-06-22 DIAGNOSIS — R1013 Epigastric pain: Secondary | ICD-10-CM | POA: Diagnosis not present

## 2017-06-22 DIAGNOSIS — R634 Abnormal weight loss: Secondary | ICD-10-CM | POA: Diagnosis present

## 2017-06-22 MED ORDER — SODIUM CHLORIDE 0.9 % IV SOLN: 500.0000 mL | INTRAVENOUS | Status: DC

## 2017-06-22 MED ORDER — GLYCOPYRROLATE 1 MG PO TABS: 1.0000 mg | ORAL_TABLET | Freq: Two times a day (BID) | ORAL | 2 refills | Status: DC

## 2017-06-22 NOTE — Patient Instructions (Addendum)
YOU HAD AN ENDOSCOPIC PROCEDURE TODAY AT [facility]:   Refer to the procedure report that was given to you for any specific questions about what was found during the examination.  If the procedure report does not answer your questions, please call your gastroenterologist to clarify.  If you requested that your care partner not be given the details of your procedure findings, then the procedure report has been included in a sealed envelope for you to review at your convenience later.  YOU SHOULD EXPECT: Some feelings of bloating in the abdomen. Passage of more gas than usual.  Walking can help get rid of the air that was put into your GI tract during the procedure and reduce the bloating. If you had a lower endoscopy (such as a colonoscopy or flexible sigmoidoscopy) you may notice spotting of blood in your stool or on the toilet paper. If you underwent a bowel prep for your procedure, you may not have a normal bowel movement for a few days.  Please Note:  You might notice some irritation and congestion in your nose or some drainage.  This is from the oxygen used during your procedure.  There is no need for concern and it should clear up in a day or so.  SYMPTOMS TO REPORT IMMEDIATELY:   Following upper endoscopy (EGD)  Vomiting of blood or coffee ground material  New chest pain or pain under the shoulder blades  Painful or persistently difficult swallowing  New shortness of breath  Fever of 100F or higher  Black, tarry-looking stools  For urgent or emergent issues, a gastroenterologist can be reached at any hour by calling [phone].   DIET:  We do recommend a small meal at first, but then you may proceed to your regular diet.  Drink plenty of fluids but you should avoid alcoholic beverages for 24 hours.  ACTIVITY:  You should plan to take it easy for the rest of today and you should NOT DRIVE or use heavy machinery until tomorrow (because of the sedation medicines used  during the test).    FOLLOW UP: Our staff will call the number listed on your records the next business day following your procedure to check on you and address any questions or concerns that you may have regarding the information given to you following your procedure. If we do not reach you, we will leave a message.  However, if you are feeling well and you are not experiencing any problems, there is no need to return our call.  We will assume that you have returned to your regular daily activities without incident.  If any biopsies were taken you will be contacted by phone or by letter within the next 1-3 weeks.  Please call us at [phone] if you have not heard about the biopsies in 3 weeks.   Hiatal Hernia (handout given) Glycopyrrolate 1 mg by mouth twice a day Office visit Oct. 8, 2018 at 3:00 Monday Await for biopsy results    SIGNATURES/CONFIDENTIALITY: You and/or your care partner have signed paperwork which will be entered into your electronic medical record.  These signatures attest to the fact that that the information above on your After Visit Summary has been reviewed and is understood.  Full responsibility of the confidentiality of this discharge information lies with you and/or your care-partner.

## 2017-06-22 NOTE — Op Note (Addendum)
[facility] Patient Name: Autumn Foster Procedure Date: 06/22/2017 2:08 PM MRN: [id number] Endoscopist: Ladene Artist , MD Age: 51 Referring MD:  Date of Birth: [birth year] Gender: Female Account #: [id number] Procedure:                Upper GI endoscopy Indications:              Epigastric abdominal pain, Early satiety, Weight                            loss Medicines:                Monitored Anesthesia Care Procedure:                Pre-Anesthesia Assessment:                           - Prior to the procedure, a History and Physical                            was performed, and patient medications and                            allergies were reviewed. The patient's tolerance of                            previous anesthesia was also reviewed. The risks                            and benefits of the procedure and the sedation                            options and risks were discussed with the patient.                            All questions were answered, and informed consent                            was obtained. Prior Anticoagulants: The patient has                            taken no previous anticoagulant or antiplatelet                            agents. ASA Grade Assessment: II - A patient with                            mild systemic disease. After reviewing the risks                            and benefits, the patient was deemed in                            satisfactory condition to undergo the procedure.  After obtaining informed consent, the endoscope was                            passed under direct vision. Throughout the                            procedure, the patient's blood pressure, pulse, and                            oxygen saturations were monitored continuously. The                            Model [id number]-[id number]) scope was introduced                            through the mouth, and advanced to the second part                             of duodenum. The upper GI endoscopy was                            accomplished without difficulty. The patient                            tolerated the procedure well. The Endoscope was                            introduced through the mouth, and advanced to the                            second part of duodenum. Scope In: Scope Out: Findings:                 The examined esophagus was normal.                           A small hiatal hernia was present.                           The exam of the stomach was otherwise normal.                           The duodenal bulb and second portion of the                            duodenum were normal. Random biopsies obtained. Complications:            No immediate complications. Estimated Blood Loss:     Estimated blood loss: none. Impression:               - Normal esophagus.                           - Small hiatal hernia.                           -  Normal duodenal bulb and second portion of the                            duodenum. Biopsied. Recommendation:           - Patient has a contact number available for                            emergencies. The signs and symptoms of potential                            delayed complications were discussed with the                            patient. Return to normal activities tomorrow.                            Written discharge instructions were provided to the                            patient.                           - Resume previous diet.                           - Continue present medications.                           - Glycopyrrolate 1 mg po bid, #60, 2 refills                           - GI office appt in about1 month                           - Await pathology results. Ladene Artist, MD 06/22/2017 2:30:45 PM This report has been signed electronically.

## 2017-06-22 NOTE — Progress Notes (Signed)
A and O x3. Report to RN. Tolerated MAC anesthesia well.Teeth unchanged after procedure.

## 2017-06-22 NOTE — Progress Notes (Signed)
Called to room to assist during endoscopic procedure.  Patient ID and intended procedure confirmed with present staff. Received instructions for my participation in the procedure from the performing physician.  

## 2017-06-23 ENCOUNTER — Telehealth: Payer: Self-pay

## 2017-06-23 NOTE — Telephone Encounter (Signed)
  Follow up Call-  Call back number 06/22/2017 01/07/2017  Post procedure Call Back phone  # [phone]  Permission to leave phone message Yes Yes  Some recent data might be hidden     Patient questions:  Do you have a fever, pain , or abdominal swelling? No. Pain Score  0 *  Have you tolerated food without any problems? Yes.    Have you been able to return to your normal activities? Yes.    Do you have any questions about your discharge instructions: Diet   No. Medications  No. Follow up visit  No.  Do you have questions or concerns about your Care? No.  Actions: * If pain score is 4 or above: No action needed, pain <4.

## 2017-06-28 ENCOUNTER — Encounter: Payer: Self-pay | Admitting: Gastroenterology

## 2017-06-28 ENCOUNTER — Other Ambulatory Visit (HOSPITAL_COMMUNITY): Payer: BLUE CROSS/BLUE SHIELD | Attending: Psychiatry | Admitting: Licensed Clinical Social Worker

## 2017-06-28 DIAGNOSIS — Z8051 Family history of malignant neoplasm of kidney: Secondary | ICD-10-CM | POA: Insufficient documentation

## 2017-06-28 DIAGNOSIS — Z882 Allergy status to sulfonamides status: Secondary | ICD-10-CM | POA: Insufficient documentation

## 2017-06-28 DIAGNOSIS — Z8249 Family history of ischemic heart disease and other diseases of the circulatory system: Secondary | ICD-10-CM | POA: Insufficient documentation

## 2017-06-28 DIAGNOSIS — Z811 Family history of alcohol abuse and dependence: Secondary | ICD-10-CM | POA: Insufficient documentation

## 2017-06-28 DIAGNOSIS — F419 Anxiety disorder, unspecified: Secondary | ICD-10-CM | POA: Insufficient documentation

## 2017-06-28 DIAGNOSIS — M8580 Other specified disorders of bone density and structure, unspecified site: Secondary | ICD-10-CM | POA: Insufficient documentation

## 2017-06-28 DIAGNOSIS — Z880 Allergy status to penicillin: Secondary | ICD-10-CM | POA: Insufficient documentation

## 2017-06-28 DIAGNOSIS — Z79899 Other long term (current) drug therapy: Secondary | ICD-10-CM | POA: Insufficient documentation

## 2017-06-28 DIAGNOSIS — Z8042 Family history of malignant neoplasm of prostate: Secondary | ICD-10-CM | POA: Insufficient documentation

## 2017-06-28 DIAGNOSIS — F332 Major depressive disorder, recurrent severe without psychotic features: Secondary | ICD-10-CM | POA: Insufficient documentation

## 2017-06-28 DIAGNOSIS — Z818 Family history of other mental and behavioral disorders: Secondary | ICD-10-CM | POA: Insufficient documentation

## 2017-06-28 DIAGNOSIS — G8929 Other chronic pain: Secondary | ICD-10-CM | POA: Insufficient documentation

## 2017-06-28 DIAGNOSIS — Z888 Allergy status to other drugs, medicaments and biological substances status: Secondary | ICD-10-CM | POA: Insufficient documentation

## 2017-06-29 ENCOUNTER — Encounter (HOSPITAL_COMMUNITY): Payer: Self-pay | Admitting: Psychiatry

## 2017-06-29 ENCOUNTER — Other Ambulatory Visit (HOSPITAL_COMMUNITY): Payer: BLUE CROSS/BLUE SHIELD | Admitting: Licensed Clinical Social Worker

## 2017-06-29 DIAGNOSIS — Z811 Family history of alcohol abuse and dependence: Secondary | ICD-10-CM | POA: Diagnosis not present

## 2017-06-29 DIAGNOSIS — M8580 Other specified disorders of bone density and structure, unspecified site: Secondary | ICD-10-CM | POA: Diagnosis not present

## 2017-06-29 DIAGNOSIS — Z8042 Family history of malignant neoplasm of prostate: Secondary | ICD-10-CM | POA: Diagnosis not present

## 2017-06-29 DIAGNOSIS — Z888 Allergy status to other drugs, medicaments and biological substances status: Secondary | ICD-10-CM | POA: Diagnosis not present

## 2017-06-29 DIAGNOSIS — Z79899 Other long term (current) drug therapy: Secondary | ICD-10-CM | POA: Diagnosis not present

## 2017-06-29 DIAGNOSIS — F419 Anxiety disorder, unspecified: Secondary | ICD-10-CM | POA: Diagnosis not present

## 2017-06-29 DIAGNOSIS — F332 Major depressive disorder, recurrent severe without psychotic features: Secondary | ICD-10-CM | POA: Diagnosis not present

## 2017-06-29 DIAGNOSIS — G8929 Other chronic pain: Secondary | ICD-10-CM | POA: Diagnosis not present

## 2017-06-29 DIAGNOSIS — Z8249 Family history of ischemic heart disease and other diseases of the circulatory system: Secondary | ICD-10-CM | POA: Diagnosis not present

## 2017-06-29 DIAGNOSIS — Z880 Allergy status to penicillin: Secondary | ICD-10-CM | POA: Diagnosis not present

## 2017-06-29 DIAGNOSIS — Z8051 Family history of malignant neoplasm of kidney: Secondary | ICD-10-CM | POA: Diagnosis not present

## 2017-06-29 DIAGNOSIS — Z882 Allergy status to sulfonamides status: Secondary | ICD-10-CM | POA: Diagnosis not present

## 2017-06-29 DIAGNOSIS — Z818 Family history of other mental and behavioral disorders: Secondary | ICD-10-CM | POA: Diagnosis not present

## 2017-06-29 NOTE — Psych (Deleted)
Behavioral Health Partial Program Assessment Note  Date: 06/29/2017 Name: Autumn Foster MRN: [id number]  Chief Complaint: ***  Subjective:   HPI: Patient is a 51 y.o. {race/ethnicity:22866} female presents with ***.  Patient was enrolled in partial psychiatric program on 06/29/17.  Primary complaints include: { :22888}.  Onset of symptoms was {onset:22896} with {clinical course - history:22865} course since that time. Psychosocial Stressors include the following: { :22893}.   I have reviewed the following documentation dated ***: {doc:22884}  Complaints of Pain: {pain assessment:22895}ar Past Psychiatric History:  { :22886}  Currently in treatment with ***.  Substance Abuse History: {Recreational drugs:22897} Use of Alcohol: { :22889} Use of Caffeine: { :22892} Use of over the counter: ***  Past Surgical History:  Procedure Laterality Date  . COLONOSCOPY  12/29/2010   2 sessile polyps Dr. Fuller Plan  . DILATION AND CURETTAGE OF UTERUS    . HYSTEROSCOPY      Past Medical History:  Diagnosis Date  . Anemia   . Anxiety   . ASCUS on Pap smear   . Bacterial infection   . Bladder spasm   . Blood in stool   . Candida vaginitis   . Cervical dysplasia   . Colon polyp    tubular adenoma  . Depression   . Dizziness 04/05/2016  . Dysmenorrhea   . Endometrial polyp   . Fatigue   . Genital warts   . Hemorrhoids   . Herpes   . History of chicken pox   . HPV (human papilloma virus) infection   . Low blood pressure   . Lyme disease   . Malignancy ([place])   . Menorrhagia   . Orthostatic hypotension 04/05/2016  . Osteopenia   . Ovarian cyst   . PMDD (premenstrual dysphoric disorder)   . POTS (postural orthostatic tachycardia syndrome) 04/05/2016  . Rectal bleed   . Urinary frequency   . Urine incontinence   . Yeast infection    Outpatient Encounter Prescriptions as of 06/29/2017  Medication Sig Note  . Ascorbic Acid (VITAMIN C) 1000 MG tablet Take 1,000 mg by mouth daily.    . cholecalciferol (VITAMIN D) 1000 UNITS tablet Take 4,000 Units by mouth daily.    . Coenzyme Q10 (CO Q 10 PO) Take 100 mg by mouth as directed.    . fish oil-omega-3 fatty acids 1000 MG capsule Take 2 g by mouth daily.   Marland Kitchen glycopyrrolate (ROBINUL) 1 MG tablet Take 1 tablet (1 mg total) by mouth 2 (two) times daily.   Marland Kitchen LORazepam (ATIVAN) 0.5 MG tablet Take 0.5 mg by mouth every 8 (eight) hours as needed.    Marland Kitchen MAGNESIUM PO Take 1 tablet by mouth daily. 400-800 MG as directed Reported on 04/05/2016   . Probiotic Product (ACIDOPHILUS PROBIOTIC BLEND) CAPS Take 1 capsule by mouth daily. 03/23/2016: Received from: [facility] Received Sig: Take by mouth.  . Zinc 50 MG CAPS Take by mouth.    Facility-Administered Encounter Medications as of 06/29/2017  Medication  . 0.9 %  sodium chloride infusion  . 0.9 %  sodium chloride infusion  . gadopentetate dimeglumine (MAGNEVIST) injection 15 mL   Allergies  Allergen Reactions  . Doxycycline     Fatigue  . Lexapro [Escitalopram Oxalate] Other (See Comments)    Insomnia   . Pristiq [Desvenlafaxine]     Dry heaves/dizzy  . Prozac [Fluoxetine Hcl]     HA and Strange Dreams  . Septra Ds [Sulfamethoxazole-Trimethoprim]  Jitteriness/hyper/affecting sleep  . Penicillins Rash    Childhood rash    Social History  Substance Use Topics  . Smoking status: Never Smoker  . Smokeless tobacco: Never Used  . Alcohol use No   Functioning Relationships: { :22891} Education: {Education:22867} Other Pertinent History: {historical info:22887} Family History  Problem Relation Age of Onset  . Prostate cancer Father   . Hypertension Father   . Renal cancer Father   . Migraines Mother   . Alcohol abuse Mother   . Suicidality Mother        deceased  . Colon cancer Paternal Grandfather   . Dementia Paternal Grandmother   . Thyroid disease Maternal Grandmother   . Rheum arthritis Maternal Grandmother   . Diabetes Maternal  Grandfather   . Anxiety disorder Sister        x 2     Review of Systems {Review Of Systems:22885}  Objective:  There were no vitals filed for this visit.  Physical Exam: {Exam, Complete:22871}  Mental Status Exam: Appearance:  {appearance:22767} Psychomotor::  {Exam; behavior child:22792} Attention span and concentration: {Att/con:22794} Behavior: {behavior:22795} Speech:  {Speech:22796} Mood:  {mood:22843} Affect:  {Desc; affect:22845::"normal"} Thought Process:  {Thought Process (PAA):22688} Thought Content:  {Thought Content:22690} Orientation:  {orientation:22848} Cognition:  {Psych cognition:22849} Insight:  {insight and judgement:22851} Judgment:  {insight and judgement:22851} Estimate of Intelligence: {Choices:22852} Fund of knowledge: {Knowledge:22856} Memory: {Memory:22857} Abnormal movements: {Abnormal:22859} Gait and station: {Gait and Station:22860}  Assessment:  Diagnosis: No primary diagnosis found. No diagnosis found.  Indications for admission: {Indications:22876}  Plan: {Plan:22883}  Treatment options and alternatives reviewed with patient and patient {Understands:22882}.   Comments: *** .    Donnelly Angelica, MD

## 2017-06-29 NOTE — Psych (Signed)
Comprehensive Clinical Assessment (CCA) Note  06/29/2017 Autumn Foster [id number]  Visit Diagnosis:      ICD-10-CM   1. Severe episode of recurrent major depressive disorder, without psychotic features ([place]) F33.2       CCA Part One  Part One has been completed on paper by the patient.  (See scanned document in Chart Review)  CCA Part Two A  Intake/Chief Complaint:  CCA Intake With Chief Complaint CCA Part Two Date: 06/28/17 CCA Part Two Time: 52 Chief Complaint/Presenting Problem: Pt presents with depression symptoms.  Pt states she has been depressed for about a year, but symptoms are rapidly getting worse.  Pt reports she was diagnosed with Lyme Disease in May '17 and was bed-ridden for the summer of '17.  Pt states this was hard because she has always been a very active person.  Pt reports she is experiencing SI and it worries her because her mother committed suicide in '07.  Pt reports she has experienced depression in the past, but was able to "overcome it" within 6 months time with the help of meds.  Pt states meds have not seemed to help this time, or at least she can't find the right one.  Pt reports she is experiencing a lot of hopelessness and can't see a positive future anymore.  Pt reports she struggles to be alone because "that's when my mind starts to race.  I used to love my alone time and now I can't stand it."  Pt reports her home "feels like a prison" due to being stuck in bed last year.  Pt states there is some marital stress now due to her husband not knowing how to handle this.  Pt reports she tried [facility] over the summer of '18 and "it did not work."  Pt reports she is unable to eat a lot of foods now due to her Lyme Disease, which is a struggle for her. Patients Currently Reported Symptoms/Problems: depression, anxiety, SI, hopelessness, anhedonia, sleep issues, racing thoughts, excessive worrying, marital stress, obsessive thoughts, poor concentration Individual's  Strengths: Motivation for treatment; Supportive family and friends Type of Services Patient Feels Are Needed: Pt is currently seeing a counselor online, but states she needs more intense treatment. Initial Clinical Notes/Concerns: Pt reports she has never tried group therapy.  She states she is a little worried due to her tendency to take on other people's "stuff".  Pt agreed to try group but if it became too much, she would let the cln know and other treatment options would be looked into.  Mental Health Symptoms Depression:  Depression: Change in energy/activity, Difficulty Concentrating, Hopelessness, Tearfulness, Worthlessness  Mania:     Anxiety:      Psychosis:     Trauma:     Obsessions:     Compulsions:     Inattention:     Hyperactivity/Impulsivity:     Oppositional/Defiant Behaviors:     Borderline Personality:     Other Mood/Personality Symptoms:      Mental Status Exam Appearance and self-care  Stature:  Stature: Tall  Weight:  Weight: Thin  Clothing:  Clothing: Casual  Grooming:  Grooming: Normal  Cosmetic use:  Cosmetic Use: Age appropriate  Posture/gait:  Posture/Gait: Normal  Motor activity:     Sensorium  Attention:  Attention: Normal  Concentration:  Concentration: Scattered (Pt would start to answer a question, get off on other topics, and then have to ask what the original question was.)  Orientation:  Orientation: X5  Recall/memory:  Recall/Memory: Normal  Affect and Mood  Affect:  Affect: Tearful, Depressed  Mood:  Mood: Depressed  Relating  Eye contact:  Eye Contact: Fleeting  Facial expression:  Facial Expression: Depressed  Attitude toward examiner:  Attitude Toward Examiner: Cooperative  Thought and Language  Speech flow: Speech Flow: Normal  Thought content:  Thought Content: Appropriate to mood and circumstances  Preoccupation:     Hallucinations:     Organization:     Transport planner of Knowledge:  Fund of Knowledge: Average   Intelligence:  Intelligence: Average  Abstraction:     Judgement:  Judgement: Fair  Art therapist:  Reality Testing: Realistic  Insight:  Insight: Fair  Decision Making:  Decision Making: Paralyzed  Social Functioning  Social Maturity:     Social Judgement:     Stress  Stressors:  Stressors: Illness  Coping Ability:  Coping Ability: Research officer, political party Deficits:     Supports:      Family and Psychosocial History: Family history Marital status: Married Number of Years Married: 25 What types of issues is patient dealing with in the relationship?: Pt states her husband does not know how to deal with "this" (her depression) Does patient have children?: Yes How many children?: 3 How is patient's relationship with their children?: Pt states she is close with all of her children; 2 are in college, 1 74yo at home  Childhood History:  Childhood History By whom was/is the patient raised?: Both parents Additional childhood history information: Pt states she had a good childhood, though parents often argued and "they were negative people."   Description of patient's relationship with caregiver when they were a child: Pt was close with both parents while growing up Patient's description of current relationship with people who raised him/her: Pt still talks to/sees Dad multiple times a week; Pt's mother committed suicide in '07 Does patient have siblings?: Yes Number of Siblings: 2 Description of patient's current relationship with siblings: Pt is close with both of her sisters; "We talk daily" Did patient suffer any verbal/emotional/physical/sexual abuse as a child?: No Did patient suffer from severe childhood neglect?: No Has patient ever been sexually abused/assaulted/raped as an adolescent or adult?: No Was the patient ever a victim of a crime or a disaster?: No Witnessed domestic violence?: No Has patient been effected by domestic violence as an adult?: No  CCA Part Two  B  Employment/Work Situation: Employment / Work Copywriter, advertising Employment situation: Unemployed Has patient ever been in the [employer]?: No Has patient ever served in Recruitment consultant?: No Did You Receive Any Psychiatric Treatment/Services While in Passenger transport manager?: No Are There Guns or Other Weapons in Your Home?: Yes Are These Weapons Safely Secured?: Yes (Pt states the guns at home are in [place].  She does not have keys/combinations to any)  Education: Education Did Physicist, medical?: Yes What Type of College Degree Do you Have?: [place] Did You Have An Individualized Education Program (IIEP): No Did You Have Any Difficulty At School?: No  Religion: Religion/Spirituality Are You A Religious Person?: Yes ("I have always been spiritual, but I am trying to be religious and find a reason from God for all of this.") What is Your Religious Affiliation?: Christian  Leisure/Recreation: Leisure / Recreation Leisure and Hobbies: walking, exercise, volunteering  Exercise/Diet: Exercise/Diet Do You Exercise?: Yes What Type of Exercise Do You Do?: Run/Walk (Yoga) How Many Times a Week Do You Exercise?: 1-3 times a week Have You Gained or Lost A Significant  Amount of Weight in the Past Six Months?: Yes-Lost Number of Pounds Lost?: 15 Do You Follow a Special Diet?: Yes Type of Diet: Many foods are restricted due to allergies and Lyme Disease Do You Have Any Trouble Sleeping?: Yes Explanation of Sleeping Difficulties: "sometimes; used to have more trouble with insomnia"  CCA Part Two C  Alcohol/Drug Use: Alcohol / Drug Use Pain Medications: pt denies Prescriptions: Viibryd, 10mg  Over the Counter: [place] [facility] History of alcohol / drug use?: No history of alcohol / drug abuse     CCA Part Three  ASAM's:  Six Dimensions of Multidimensional Assessment  Dimension 1:  Acute Intoxication and/or Withdrawal Potential:     Dimension 2:  Biomedical Conditions and Complications:     Dimension 3:   Emotional, Behavioral, or Cognitive Conditions and Complications:     Dimension 4:  Readiness to Change:     Dimension 5:  Relapse, Continued use, or Continued Problem Potential:     Dimension 6:  Recovery/Living Environment:      Substance use Disorder (SUD)    Social Function:     Stress:  Stress Stressors: Illness Coping Ability: Exhausted Patient Takes Medications The Way The Doctor Instructed?: Yes Priority Risk: High Risk  Risk Assessment- Self-Harm Potential: Risk Assessment For Self-Harm Potential Thoughts of Self-Harm: Vague current thoughts Method: No plan Availability of Means: No access/NA Additional Information for Self-Harm Potential: Family History of Suicide, Preoccupation with Death Additional Comments for Self-Harm Potential: Pt reports she has not made a plan, but has thought about what she would say in letters to people and about her funeral plans.  Pt reports "I don't want to feel anymore."  Pt states she has SI daily.  Pt's mother killed self in '07.    Risk Assessment -Dangerous to Others Potential: Risk Assessment For Dangerous to Others Potential Method: No Plan Availability of Means: No access or NA Notification Required: No need or identified person  DSM5 Diagnoses: Patient Active Problem List   Diagnosis Date Noted  . Dizziness 04/05/2016  . Orthostatic hypotension 04/05/2016  . POTS (postural orthostatic tachycardia syndrome) 04/05/2016  . Chest pain 06/20/2014  . Palpitations 06/20/2014  . PVC (premature ventricular contraction) 06/20/2014  . Abdominal pain 11/07/2012  . Anxiety 11/07/2012    Patient Centered Plan: Patient is on the following Treatment Plan(s):  Depression  Recommendations for Services/Supports/Treatments: Recommendations for Services/Supports/Treatments Recommendations For Services/Supports/Treatments: Partial Hospitalization (Pt reports her depression symptoms and SI are increasingly getting worse.  Pt needs to increase  skills to manage depression )  Treatment Plan Summary: OP Treatment Plan Summary: Pt states, "I can't do this a whole lot longer.  I'm at the end of my rope.  I need help."  Referrals to Alternative Service(s): Referred to Alternative Service(s):   Place:   Date:   Time:    Referred to Alternative Service(s):   Place:   Date:   Time:    Referred to Alternative Service(s):   Place:   Date:   Time:    Referred to Alternative Service(s):   Place:   Date:   Time:     Royetta Crochet, LPCA

## 2017-06-30 ENCOUNTER — Other Ambulatory Visit (HOSPITAL_COMMUNITY): Payer: BLUE CROSS/BLUE SHIELD | Admitting: Licensed Clinical Social Worker

## 2017-06-30 ENCOUNTER — Encounter (HOSPITAL_COMMUNITY): Payer: Self-pay

## 2017-06-30 ENCOUNTER — Other Ambulatory Visit (HOSPITAL_COMMUNITY): Payer: BLUE CROSS/BLUE SHIELD | Admitting: Specialist

## 2017-06-30 VITALS — BP 124/68 | HR 72 | Ht 72.0 in | Wt 143.0 lb

## 2017-06-30 DIAGNOSIS — F332 Major depressive disorder, recurrent severe without psychotic features: Secondary | ICD-10-CM | POA: Diagnosis not present

## 2017-06-30 DIAGNOSIS — R4589 Other symptoms and signs involving emotional state: Secondary | ICD-10-CM

## 2017-06-30 NOTE — Therapy (Signed)
[facility] [place], [place], [address] Phone: [phone]   Fax:  [phone]  Occupational Therapy Evaluation  Patient Details  Name: MAHSA HANSER MRN: [id number] Date of Birth: 04/01/1966 No Data Recorded  Encounter Date: 06/30/2017      OT End of Session - 06/30/17 2154    Visit Number 1   Number of Visits 6   Date for OT Re-Evaluation 07/21/17   Authorization Type BCBS   OT Start Time 1030   OT Stop Time 1135   OT Time Calculation (min) 65 min   Activity Tolerance Patient tolerated treatment well      Past Medical History:  Diagnosis Date  . Anemia   . Anxiety   . ASCUS on Pap smear   . Bacterial infection   . Bladder spasm   . Blood in stool   . Candida vaginitis   . Cervical dysplasia   . Colon polyp    tubular adenoma  . Depression   . Dizziness 04/05/2016  . Dysmenorrhea   . Endometrial polyp   . Fatigue   . Genital warts   . Hemorrhoids   . Herpes   . History of chicken pox   . HPV (human papilloma virus) infection   . Low blood pressure   . Lyme disease   . Malignancy ([place])   . Menorrhagia   . Orthostatic hypotension 04/05/2016  . Osteopenia   . Ovarian cyst   . PMDD (premenstrual dysphoric disorder)   . POTS (postural orthostatic tachycardia syndrome) 04/05/2016  . Rectal bleed   . Urinary frequency   . Urine incontinence   . Yeast infection     Past Surgical History:  Procedure Laterality Date  . COLONOSCOPY  12/29/2010   2 sessile polyps Dr. Fuller Plan  . DILATION AND CURETTAGE OF UTERUS    . HYSTEROSCOPY      There were no vitals filed for this visit.      Subjective Assessment - 06/30/17 2152    Currently in Pain? 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  There is clear connection between behavior and interest/personal goals.  Motivation is intact.  o    o       Assessment:  Patient demonstrates autonomy-oriented motivation type.  Patient will benefit from occupational therapy intervention in order to improve time management, financial management, stress management, job readiness skills, social skills,sleep hygiene, exercise and healthy eating habits,  and health management skills and other psychosocial skills needed for preparation to return to full time community living and to be a productive community member.   Plan:  Patient will participate in skilled occupational therapy sessions individually or in a group setting to improve coping skills, psychosocial skills, and emotional skills required to return to prior level of function as a productive community member. Treatment will be 1-2 times per week for 2-6 weeks.  S:  I cant find joy in anything.  I want to and I just cant.  O:  Patient participated in skilled OT life skills group focusing on stress management.  Patient was educated on and discussed causes of stress, healthy vs unhealthy stress, behaviors, symptoms, feelings associated with stress.  Patient was educated on relaxation techniques and emotional regulation techniques to cope with stress, as well as calming activities associated with each of their 5 senses.   A:  Patient was engaged throughout group.  Open to suggestions for stress relief, however unable to voice which activities that she may try.   P:  continue participation in skilled OT group 2  times per week.  Next group will focus on healthy sleep hygiene.                      OT Education - 06/30/17 2152    Education provided Yes   Education Details educated on stress management techniques including relaxation, emotional regulation, use of 5 senses   Person(s) Educated Patient   Methods Explanation;Handout   Comprehension Verbalized understanding          OT Short Term Goals - 06/30/17 2157      OT SHORT TERM GOAL #1   Title Patient will be educated on strategies to improve psychosocial skills needed to participate fully in all daily, work, and leisure activities.   Time 3   Period Weeks   Status New   Target Date 07/21/17     OT SHORT TERM GOAL #2   Title Patient will be educated on a HEP and independent with implementation of HEP.   Time 3   Period Weeks   Status New   Target Date 07/21/17     OT SHORT TERM GOAL #3   Title Patient will independently apply psychosocial skills and coping mechanisms to her daily activities in order to function independently.   Time 3   Period Weeks   Status New   Target Date 07/21/17                  Plan - 06/30/17 2155    Occupational Profile and client history currently impacting functional performance motiviation   Occupational performance deficits (Please refer to evaluation for details): ADL's;IADL's;Rest and Sleep;Leisure;Social Participation   Rehab Potential Good   OT Frequency 2x / week   OT Duration --  3 weeks   OT Treatment/Interventions Self-care/ADL training  coping skills and psycho social skills      Patient will benefit from skilled therapeutic intervention in order to improve the following deficits and impairments:   (decreased coping skills, decreased psychosocial skills)  Visit Diagnosis: Severe episode of recurrent major depressive disorder, without psychotic features ([place])  Difficulty coping    Problem List Patient Active Problem List   Diagnosis Date Noted  .  Dizziness 04/05/2016  . Orthostatic hypotension 04/05/2016  . POTS (postural orthostatic tachycardia syndrome) 04/05/2016  . Chest pain 06/20/2014  . Palpitations 06/20/2014  . PVC (premature ventricular contraction) 06/20/2014  . Abdominal pain 11/07/2012  . Anxiety 11/07/2012    Penelope Galas [place] 06/30/2017, 9:59 PM  [facility] PARTIAL HOSPITALIZATION PROGRAM [place], [place], [address] Phone: [phone]   Fax:  [phone]  Name: STARLETT PEHRSON MRN: [id number] Date of Birth: 1966/06/02

## 2017-06-30 NOTE — Progress Notes (Signed)
Met with patient today as she presented with appropriate affect, depressed mood and reported she had Lyme's Disease diagnosed 04/2015 that really began to take an affect 02/2016 as she had menopause and kids going off to college happen all at the same time, per patient words.  Patient states she has chronic joint pain particularly in her knees and elbows from Lyme's Disease that was made worse with Viibryd.  Patient reports Dr. Lovena Le is tapering her off Viibryd now so hopes this will improve soon.  Patient stated "I use to have a life" but now admits with her chronic pain and resulting depression that has worsened over the past year she often does not have hope and admitted to recent thoughts to harm self.  Patient reported her mother committed suicide and does not want to follow in that path but currently does not find joy in anything she does.  Patient reported she has "a great support system" with a son and daughter at [facility] [employer] and another child in high school. She explained that positive events such as planning for all of them to go to [facility] in a week for a concert become overwhelming as she tries to process being able to shop for a dress or to look forward to event with potential worsening of pain.  Patient reported her current level of depression an 8, hopelessness a 5 and anxiety a 5 on a scale of 0-10 with 0 being none and 10 the worst she could experience.  Patient denied any current suicidal or homicidal ideations with no plan or intent to harm self or others.  Patient reported she is optimistic ECT will be helpful as she stated this was the plan she had made to try with Dr. Lovena Le and she will call to get an appointment for a consult today.  Patient reported limited improvement with [place] and past antidepressants and agreed this was the next option for treatment.  Patient reported PHP was "fine" but admitted the she may not continue only because she stated she tends to take on "other's stories" and that is  tough on her too.  Patient reported sleeping okay but broken at times and weight loss of 13 pounds with depression over the past year.  Patient agreed to remain with PHP while provider works with her to connect with ECT and called [facility] to provide patient's name and information, per patient approval, and to inform she would be calling later today to set up a consult appointment with Dr. Weber Cooks.  Vernell Morgans, CMA reported he had availability and would assist patient with making appointment and informed patient of this information.  Patient stable today with denial of any plans or intent to harm self or others and will keep PHP staff informed of plans once ECT appointment made.

## 2017-06-30 NOTE — Psych (Signed)
   [facility] PHP THERAPIST PROGRESS NOTE  Autumn Foster [id number]  Session Time: 9- 2  Participation Level: Active  Behavioral Response: CasualAlertDepressed  Type of Therapy: Group Therapy  Treatment Goals addressed: Coping  Interventions: CBT, DBT, Supportive and Reframing  Summary:   9:00 - 10:30 Clinician led check-in regarding current stressors and situation, and review of patient completed daily inventory. Clinician utilized active listening and empathetic response and validated patient emotions. Clinician facilitated processing group on pertinent issues.  10:30 -12:00 Spiritual care group  12:00 - 12:45 Reflection group: Patients encouraged to practice skills and interpersonal techniques or work on mindfulness and relaxation techniques. The importance of self-care and making skills part of a routine to increase usage were stressed.  12:45 - 1:50 Relaxation group: Cln Jan Fireman led yoga group focused on retraining the body's response to stress.  1:50 - 2:00 Clinician led check-out. Clinician assessed for immediate needs, medication compliance and efficacy, and safety concerns.    Suicidal/Homicidal: Nowithout intent/plan  Therapist Response: [name] is a 51 y.o. female who presents with depression symptoms.Patient arrived within time allowed and reports she is feeling "OK." Patient rates her mood at a 2 on a 1- 10 scale with 10 being great. Pt reports she feels she has done "everything" and nothing helps. Pt states approximately one year ago she had a medical issue that has completely effected the way she lives her life. Pt struggles to accept that her life is different and can still be worthwhile. Patient engaged in activity and discussion. Patient has some resistance to doing skills and reframing thoughts stating skepticism. Patient demonstrates no progress as this is her first session of group . Patient denies SI/HI/self-harm thoughts at end of group.   Plan: Patient  will work towards decreasing depression symptoms and increasing ability to manage symptoms.   Diagnosis: Severe episode of recurrent major depressive disorder, without psychotic features ([place]) [F33.2]    1. Severe episode of recurrent major depressive disorder, without psychotic features ([place])       Lorin Glass, LCSW 06/30/2017

## 2017-06-30 NOTE — Psych (Signed)
Behavioral Health Partial Program Assessment Note  Date: 06/30/2017 Name: Autumn Foster MRN: [id number]  Chief Complaint: depression unresponsive to medication an therapy  Subjective:sad, no motivation or energy, sense of hopelessness and beginning to feel she cannot go on living this way  HPI: Autumn Foster says she contracted Lyme's disease a couple of years ago and has not been the same since.  Has had many issues with chronic pain and lethargy with the Lyme's in addition to depression.  She has been depressed in the past but has responded to medication and therapy before now.  In the current episode nothing has helped.  It seems to her her brain chemistry has been affected.  She is sad, has no energy, motivation or interest in usual activities, cannot find pleasure in things or family, feels hopeless that things will ever get better but is not  Actively suicidal.  Her life is good.  Marriage is good, finances are not a problem, has wonderful children and has no reason to be depressed other than the chronic pain and lethargy.Patient is a 51 y.o. Caucasian female presents with depression.  Patient was enrolled in partial psychiatric program on 06/30/17.  Primary complaints include: agitation, anxiety, chronic pain, concern about health problems, depression worse, feeling depressed and poor concentration.  Onset of symptoms was gradual with gradually worsening course since that time. Psychosocial Stressors include the following: Lyme's disease sequelae.   I have reviewed the history as presented by the patient  Complaints of Pain: aches of joints and muscles daily Past Psychiatric History:  Ongoing outpatient therapy with med management.  No inpatient treatment   Substance Abuse History: none Use of Alcohol: denied Use of Caffeine: other not an issue Use of over the counter: not an issue  Past Surgical History:  Procedure Laterality Date  . COLONOSCOPY  12/29/2010   2 sessile polyps Dr. Fuller Plan   . DILATION AND CURETTAGE OF UTERUS    . HYSTEROSCOPY      Past Medical History:  Diagnosis Date  . Anemia   . Anxiety   . ASCUS on Pap smear   . Bacterial infection   . Bladder spasm   . Blood in stool   . Candida vaginitis   . Cervical dysplasia   . Colon polyp    tubular adenoma  . Depression   . Dizziness 04/05/2016  . Dysmenorrhea   . Endometrial polyp   . Fatigue   . Genital warts   . Hemorrhoids   . Herpes   . History of chicken pox   . HPV (human papilloma virus) infection   . Low blood pressure   . Lyme disease   . Malignancy ([place])   . Menorrhagia   . Orthostatic hypotension 04/05/2016  . Osteopenia   . Ovarian cyst   . PMDD (premenstrual dysphoric disorder)   . POTS (postural orthostatic tachycardia syndrome) 04/05/2016  . Rectal bleed   . Urinary frequency   . Urine incontinence   . Yeast infection    Outpatient Encounter Prescriptions as of 06/30/2017  Medication Sig Note  . Ascorbic Acid (VITAMIN C) 1000 MG tablet Take 1,000 mg by mouth daily.   . Coenzyme Q10 (CO Q 10 PO) Take 100 mg by mouth as directed.    . fish oil-omega-3 fatty acids 1000 MG capsule Take 2 g by mouth daily.   Marland Kitchen LORazepam (ATIVAN) 0.5 MG tablet Take 0.5 mg by mouth every 8 (eight) hours as needed.  06/30/2017: Only as needed.  Marland Kitchen  MAGNESIUM PO Take 1 tablet by mouth daily. 400-800 MG as directed Reported on 04/05/2016   . Probiotic Product (ACIDOPHILUS PROBIOTIC BLEND) CAPS Take 1 capsule by mouth daily. 03/23/2016: Received from: [facility] Received Sig: Take by mouth.  . Zinc 50 MG CAPS Take by mouth.   . cholecalciferol (VITAMIN D) 1000 UNITS tablet Take 4,000 Units by mouth daily.    Marland Kitchen glycopyrrolate (ROBINUL) 1 MG tablet Take 1 tablet (1 mg total) by mouth 2 (two) times daily. (Patient not taking: Reported on 06/30/2017)    Facility-Administered Encounter Medications as of 06/30/2017  Medication  . 0.9 %  sodium chloride infusion  . 0.9 %  sodium chloride infusion   . gadopentetate dimeglumine (MAGNEVIST) injection 15 mL   Allergies  Allergen Reactions  . Doxycycline     Fatigue  . Lexapro [Escitalopram Oxalate] Other (See Comments)    Insomnia   . Pristiq [Desvenlafaxine]     Dry heaves/dizzy  . Prozac [Fluoxetine Hcl]     HA and Strange Dreams  . Septra Ds [Sulfamethoxazole-Trimethoprim]     Jitteriness/hyper/affecting sleep  . Penicillins Rash    Childhood rash    Social History  Substance Use Topics  . Smoking status: Never Smoker  . Smokeless tobacco: Never Used  . Alcohol use No   Functioning Relationships: good support system, good relationship with spouse or significant other and good relationship with children Education: College       Please specify degree: not asked Other Pertinent History: None Family History  Problem Relation Age of Onset  . Prostate cancer Father   . Hypertension Father   . Renal cancer Father   . Migraines Mother   . Alcohol abuse Mother   . Suicidality Mother        deceased  . Colon cancer Paternal Grandfather   . Dementia Paternal Grandmother   . Thyroid disease Maternal Grandmother   . Rheum arthritis Maternal Grandmother   . Diabetes Maternal Grandfather   . Anxiety disorder Sister        x 2     Review of Systems Constitutional: negative Eyes: negative Ears, nose, mouth, throat, and face: negative Respiratory: negative Cardiovascular: negative Gastrointestinal: general lower abdominal  Genitourinary: negative Integument/breast: negative Hematologic/lymphatic: negative Musculoskeletal: aches in muscles through out body Neurological: negative Behavioral/Psych: depression Endocrine: negative Allergic/Immunologic: negative  Objective:  Vitals:   06/30/17 1337  BP: 124/68  Pulse: 72  Resp: (!) 98    Physical Exam: No exam performed today, no exam necessary.  Mental Status Exam: Appearance:  Well groomed Psychomotor::  Within Normal Limits Attention span and  concentration: Normal Behavior: calm, cooperative and adequate rapport can be established Speech:  normal pitch and normal volume Mood:  depressed and anxious Affect:  normal and mood-congruent Thought Process:  Coherent and Goal Directed Thought Content:  Logical Orientation:  person, place and time/date Cognition:  grossly intact Insight:  Intact Judgment:  Intact Estimate of Intelligence: Above average Fund of knowledge: Intact Memory: Recent and remote intact Abnormal movements: None Gait and station: Normal  Assessment:  Diagnosis:  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   Comments: Plan is to pursue ECT.  Has tried many antidepressants with augmentation without improvement as well as therapy.  TMS was not effective.  Is feeling hopeless.  Discharge tomorrow as she does plan to be evaluated for ECT and that gives her some hope.  She is aware of ketamine therapy and would consider that in the future if needed.  Is tapering Vibryyd as it seems to be making her feel worse and the plan is to try fluoxetine which was helpful in the past but her nurse practitioner will make that decision. Donnelly Angelica, MD

## 2017-07-01 ENCOUNTER — Other Ambulatory Visit (HOSPITAL_COMMUNITY): Payer: BLUE CROSS/BLUE SHIELD | Admitting: Licensed Clinical Social Worker

## 2017-07-01 DIAGNOSIS — F332 Major depressive disorder, recurrent severe without psychotic features: Secondary | ICD-10-CM

## 2017-07-01 NOTE — Psych (Signed)
   [facility] BH PHP THERAPIST PROGRESS NOTE  Autumn Foster [id number]  Session Time: 9- 2  Participation Level: Active  Behavioral Response: CasualAlertDepressed  Type of Therapy: Group Therapy, Psychotherapy, Psychoeducation, Occupational Therapy  Treatment Goals addressed: Coping  Interventions: CBT, DBT, Supportive and Reframing  Summary:   9:00 - 10:30 Clinician led check-in regarding current stressors and situation, and review of patient completed daily inventory. Clinician utilized active listening and empathetic response and validated patient emotions. Clinician facilitated processing group on pertinent issues.  10:30 -11:30: OT Group  11:30-12:00: Clinician led group discussion on fear.  12:00 - 12:45 Reflection group: Patients encouraged to practice skills and interpersonal techniques or work on mindfulness and relaxation techniques. The importance of self-care and making skills part of a routine to increase usage were stressed.  12:45 - 1:50 Clinician introduced topic of "Cognitive Distortions". Patients identified cognitive distortions they often have and ways to combat these distortions.  1:50 - 2:00 Clinician led check-out. Clinician assessed for immediate needs, medication compliance and efficacy, and safety concerns.   Suicidal/Homicidal: Nowithout intent/plan  Therapist Response: Autumn Foster is a 51 y.o. female who presents with depression and anxiety symptoms.Patient arrived within time allowed and reports feeling "a little better than yesterday". Patient rates her mood at a 2 on a 1-10 scale with 10 being great. Patient reports that everyday task feels hard and that her meds are not working. Patient also stated that she feels hopeless about meds and is stressed about being a mom. Patient engaged in activity and discussion. Patient demonstrates progress as evidenced by stating ways she can utilize skills for her anxiety and depression. Patient showed the other group members  a deep breathing exercise she does to help her fall asleep at night when her mind is racing. Patient denies SI/ HI/ self-harm thoughts at the end of group.    Plan: Patient will work towards decreasing depression symptoms and increasing ability to manage symptoms.   Diagnosis: Severe recurrent major depression without psychotic features ([place]) [F33.2]    1. Severe recurrent major depression without psychotic features ([place])     [place], LPCA 07/01/2017

## 2017-07-01 NOTE — Psych (Signed)
   [facility] BH PHP THERAPIST PROGRESS NOTE  [name] [id number]  Session Time: 9- 1  Participation Level: Active  Behavioral Response: CasualAlertDepressed  Type of Therapy: Group Therapy, Psychotherapy, Psychoeducation  Treatment Goals addressed: Coping  Interventions: CBT, DBT, Supportive and Reframing  Summary:   9:00 - 10:00 Clinician led check-in regarding current stressors and situation, and review of patient completed daily inventory. Clinician utilized active listening and empathetic response and validated patient emotions. Clinician facilitated processing group on pertinent issues.  10:00 -11:00: Clinician continued discussion on cognitive distortions from yesterday. Cln educated ways to reframe unhealthy thought patterns utilizing skills of check the facts, and the best friend test.  11:00 - 12:00: Clinician led psychoeducation group on emotion, logic, and wise mind and dialectical thinking. Clinician utilized pt examples to make relevant to pt's lives.  12:00 - 12:50 Clinician introduced topic of fear related to goals. Group watched "Why You Should Define Your Fears Instead of Your Goals" Ted-Talk. Patients discussed fear setting and how to utilize it as a skill.  12:50 - 1:00 Clinician led check-out. Clinician assessed for immediate needs, medication compliance and efficacy, and safety concerns.     Suicidal/Homicidal: Nowithout intent/plan  Therapist Response: Autumn Foster is a 51 y.o. female who presents with depression and anxiety symptoms. Patient arrived within time allowed and reports she is feeling "exhausted". Patient rates her mood at a 2 or a 3 on a scale of 1-10 with 10 being great. Patient reported she slept well but hard time this morning with her physical illness. Patient stated that she snuggled and drank coffee with her daughter this morning but suddenly freaked out. Patient also reported that she cried all morning and had to call her sister to come over.  Patient shared that she still feels hopeless. Patient engaged in activity and discussion. Patient demonstrated some progress as evidenced by stating how she can use skills for her depression over the weekend to keep her mind busy and reporting increased mood of 7 by end of session. Patient denies SI/HI/ self-harm thoughts at the end of group.     Plan: Patient will work towards decreasing depression symptoms and increasing ability to manage symptoms.   Diagnosis: Severe recurrent major depression without psychotic features ([place]) [F33.2]    1. Severe recurrent major depression without psychotic features [facility])     Lorin Glass, LCSW 07/01/2017

## 2017-07-01 NOTE — Psych (Signed)
Individual 11:00-11:30  Pt presents tearful. Pt reports she is struggling to "enjoy the moment" and "find joy". Pt states she cuddled with her daughter this morning, and found herself starting to cry due to thinking thoughts about losing her. Cln discussed being mindful and not living in the "what-ifs". Pt reports she is struggling because she appreciates the support she is getting from her family, but is feeling guilty about needing the support. Pt reports it is hard to see others with physical ailments getting swarmed with support from others, but not feeling that with mental health diagnoses. Cln and pt discussed being able to ask for the help needed, and accepting the support received. Pt reports she does not feel ready to discharge today due to having a "bad morning" and would like to come back to [facility] on Monday to check-in after the weekend. Pt does report she is feeling hopeful about ECT treatments. Pt denies any SI/HI.  Karnell Vanderloop, LPCA

## 2017-07-04 ENCOUNTER — Other Ambulatory Visit (HOSPITAL_COMMUNITY): Payer: Self-pay

## 2017-07-04 ENCOUNTER — Other Ambulatory Visit (HOSPITAL_COMMUNITY): Payer: BLUE CROSS/BLUE SHIELD | Admitting: Licensed Clinical Social Worker

## 2017-07-04 DIAGNOSIS — F332 Major depressive disorder, recurrent severe without psychotic features: Secondary | ICD-10-CM

## 2017-07-04 MED ORDER — FLUOXETINE HCL 10 MG PO CAPS
ORAL_CAPSULE | ORAL | 0 refills | Status: DC
Start: 2017-07-04 — End: 2018-06-18

## 2017-07-04 NOTE — Progress Notes (Signed)
Still very depressed with her only hope the ECT.  The groups continue to not be helpful.  She knows she needs to control her negative thinking better but just cannot.  Until she gets her brain chemistry in better shape she believes she just cannot focus on all the things she might do to use the techniques better.  I anticipated she would be discharged on 7 Sept but she wanted to attend today to be closer to the scheduled ECT appointment interview tomorrow.    Plan      Discharge today with appointment tomorrow with ECT evaluation.  Called in fluoxetine 10 mg daily for 7 days and then 20 mg thereafter.  She had headaches with it the last time she took it but says she is desperate and will try anything.  She has been taking Vibryyd 5 mg daily for 5 days so I think that can be stopped.  Hard to know with her as she seems sensitive to all medications we use.     No noticeable improvement in the short stay in PHP.

## 2017-07-05 ENCOUNTER — Ambulatory Visit
Admission: RE | Admit: 2017-07-05 | Discharge: 2017-07-05 | Disposition: A | Discharge status: Home / Self Care | Payer: BLUE CROSS/BLUE SHIELD | Source: Ambulatory Visit | Attending: Psychiatry | Admitting: Psychiatry

## 2017-07-05 ENCOUNTER — Institutional Professional Consult (permissible substitution): Payer: Self-pay | Admitting: Psychiatry

## 2017-07-05 ENCOUNTER — Ambulatory Visit (INDEPENDENT_AMBULATORY_CARE_PROVIDER_SITE_OTHER): Payer: BLUE CROSS/BLUE SHIELD | Admitting: Psychiatry

## 2017-07-05 ENCOUNTER — Ambulatory Visit (HOSPITAL_COMMUNITY): Payer: Self-pay

## 2017-07-05 ENCOUNTER — Other Ambulatory Visit (HOSPITAL_COMMUNITY): Payer: Self-pay

## 2017-07-05 DIAGNOSIS — I951 Orthostatic hypotension: Secondary | ICD-10-CM | POA: Diagnosis not present

## 2017-07-05 DIAGNOSIS — R42 Dizziness and giddiness: Secondary | ICD-10-CM | POA: Insufficient documentation

## 2017-07-05 DIAGNOSIS — I4510 Unspecified right bundle-branch block: Secondary | ICD-10-CM | POA: Insufficient documentation

## 2017-07-05 DIAGNOSIS — I517 Cardiomegaly: Secondary | ICD-10-CM | POA: Diagnosis not present

## 2017-07-05 DIAGNOSIS — F419 Anxiety disorder, unspecified: Secondary | ICD-10-CM | POA: Insufficient documentation

## 2017-07-05 DIAGNOSIS — Z01810 Encounter for preprocedural cardiovascular examination: Secondary | ICD-10-CM | POA: Diagnosis present

## 2017-07-05 DIAGNOSIS — Z87898 Personal history of other specified conditions: Secondary | ICD-10-CM

## 2017-07-05 DIAGNOSIS — Z01812 Encounter for preprocedural laboratory examination: Secondary | ICD-10-CM | POA: Insufficient documentation

## 2017-07-05 DIAGNOSIS — R002 Palpitations: Secondary | ICD-10-CM | POA: Insufficient documentation

## 2017-07-05 DIAGNOSIS — I493 Ventricular premature depolarization: Secondary | ICD-10-CM | POA: Insufficient documentation

## 2017-07-05 DIAGNOSIS — Z01818 Encounter for other preprocedural examination: Secondary | ICD-10-CM | POA: Insufficient documentation

## 2017-07-05 DIAGNOSIS — F329 Major depressive disorder, single episode, unspecified: Secondary | ICD-10-CM | POA: Diagnosis not present

## 2017-07-05 DIAGNOSIS — R109 Unspecified abdominal pain: Secondary | ICD-10-CM | POA: Insufficient documentation

## 2017-07-05 DIAGNOSIS — F332 Major depressive disorder, recurrent severe without psychotic features: Secondary | ICD-10-CM

## 2017-07-05 HISTORY — DX: Malignant (primary) neoplasm, unspecified: C80.1

## 2017-07-05 LAB — CBC
HCT: 39.3 % (ref 35.0–47.0)
Hemoglobin: 13.3 g/dL (ref 12.0–16.0)
MCH: 30.8 pg (ref 26.0–34.0)
MCHC: 33.8 g/dL (ref 32.0–36.0)
MCV: 91.1 fL (ref 80.0–100.0)
PLATELETS: 251 10*3/uL (ref 150–440)
RBC: 4.31 MIL/uL (ref 3.80–5.20)
RDW: 12.8 % (ref 11.5–14.5)
WBC: 4.9 10*3/uL (ref 3.6–11.0)

## 2017-07-05 LAB — BASIC METABOLIC PANEL
Anion gap: 7 (ref 5–15)
BUN: 18 mg/dL (ref 6–20)
CHLORIDE: 101 mmol/L (ref 101–111)
CO2: 29 mmol/L (ref 22–32)
CREATININE: 0.62 mg/dL (ref 0.44–1.00)
Calcium: 9.4 mg/dL (ref 8.9–10.3)
GFR calc Af Amer: >60 mL/min (ref >60)
GLUCOSE: 76 mg/dL (ref 65–99)
Potassium: 3.7 mmol/L (ref 3.5–5.1)
SODIUM: 137 mmol/L (ref 135–145)

## 2017-07-05 LAB — URINALYSIS, COMPLETE (UACMP) WITH MICROSCOPIC
Bacteria, UA: NONE SEEN
Bilirubin Urine: NEGATIVE
Glucose, UA: NEGATIVE mg/dL
HGB URINE DIPSTICK: NEGATIVE
Ketones, ur: NEGATIVE mg/dL
Leukocytes, UA: NEGATIVE
Nitrite: NEGATIVE
PROTEIN: NEGATIVE mg/dL
SPECIFIC GRAVITY, URINE: 1.012 (ref 1.005–1.030)
SQUAMOUS EPITHELIAL / LPF: NONE SEEN
WBC UA: NONE SEEN WBC/hpf (ref 0–5)
pH: 7 (ref 5.0–8.0)

## 2017-07-05 NOTE — Progress Notes (Signed)
ECT: This is an ECT consult for this 51 year old woman referred from [facility] behavioral health. Patient reports symptoms of major depression that have been present since May 2017. Symptoms have waxed and waned but recently have been more persistent and disabling. Mood feels down almost all the time every day. Frequent crying spells. Low energy all the time. Frequent feelings of hopelessness. Patient endorses having suicidal thoughts. Has no intention to currently act on them. Able to think through positive things in her life to live for. No specific plan. Denies any psychotic symptoms or hallucinations. She has been seeing an outpatient psychiatrist regularly and has had multiple medication trials. She is just completing an intensive outpatient program. Not abusing drugs or alcohol. Patient was referred for consideration of ECT for recurrent severe depression not responsive to medicine.  Patient does not have significant other medical issues. No history of heart disease. She has had some anesthesia in the past before without difficulty.  Social history: Patient is married with 3 children to a former in college. Does not work outside the home. Sounds like there is a little strain in her relationship.  Mental status exam: Neatly dressed and groomed woman looks her stated age. Cooperative with the interview. Good eye contact. Normal psychomotor activity. Speech normal rate tone and volume. Affect is euthymic and thoughts are lucid. Denies current suicidal or homicidal intention or thought. Patient is appropriate in her interaction. Understands the nature of the treatment. She was able to ask appropriate questions and demonstrates an understanding of pros and cons of treatment.  This is a 51 year old woman with severe major depression that has not responded to multiple trials of antidepressants. She has become extremely disabled by her condition. No contraindications to ECT. Patient is a good candidate for ECT. We  recommended consideration that we start treatment and I would recommend bilateral treatment for maximum improvement. Patient was agreeable to the plan. I gave her orders for lab testing and she appears to of already gotten that done. Email will be forwarded to ECT team and we will try to consider possible starting by next Monday.

## 2017-07-06 ENCOUNTER — Other Ambulatory Visit (HOSPITAL_COMMUNITY): Payer: Self-pay

## 2017-07-06 ENCOUNTER — Telehealth: Payer: Self-pay

## 2017-07-07 ENCOUNTER — Other Ambulatory Visit (HOSPITAL_COMMUNITY): Payer: Self-pay

## 2017-07-07 ENCOUNTER — Ambulatory Visit (HOSPITAL_COMMUNITY): Payer: Self-pay

## 2017-07-07 NOTE — Psych (Signed)
   [facility] BH PHP THERAPIST PROGRESS NOTE  Autumn Foster [id number]  Session Time: 9- 2  Participation Level: Active  Behavioral Response: CasualAlertDepressed  Type of Therapy: Group Therapy, Psychotherapy, Psychoeducation, Medication Group  Treatment Goals addressed: Coping  Interventions: CBT, DBT, Supportive and Reframing  Summary:   9:00 - 10:30: Pharmacist discussed medication with group and answered questions.  10:30 -12:00: Clinician led check-in regarding current stressors and situation, and review of patient completed daily inventory. Clinician utilized active listening and empathetic response and validated patient emotions. Clinician facilitated processing group on pertinent issues.  12:00 - 12:45 Reflection group: Patients encouraged to practice skills and interpersonal techniques or work on mindfulness and relaxation techniques. The importance of self-care and making skills part of a routine to increase usage were stressed.  12:45 - 1:50 Clinician introduced topic of communication. Clinician introduced 4 communication styles: passive, aggressive, passive-aggressive, and assertive and group members discussed which style they have. Clinician educated on non-verbal aspect of communication.  1:50 - 2:00 Clinician led check-out. Clinician assessed for immediate needs, medication compliance and efficacy, and safety concerns.      Suicidal/Homicidal: Nowithout intent/plan  Therapist Response: [name] is a 51 y.o. female who presents with depression and anxiety symptoms. Patient arrived within time allowed and reports she is feeling "exhausted". Patient rates her mood at a 3 on a scale of 1-10 with 10 being great. Patient reports continued struggles with managing mood and feeling like a burden to those around her. Pt states she feels unsupported by her husband at times and also allows she sends mixed messages to him about her needs.  Patient engaged in activity and discussion.  Patient identifies passive communication as her communication style recently and shares she used to be assertive however now feels lack of agency to address her needs. Patient continues to demonstrate limited progress and will have moments of animation in group before reverting to hopeless associated speech.  Patient denies SI/HI/ self-harm thoughts at the end of group.     Plan: Patient will discharge from [facility] per her request. Pt reports she feels the group is making her worse due to taking on the problems of others. Pt reports desire to solely focus on ECT referral and medication as she feels that is the only way to help her at this time. Psychiatrist has approved pt's request to discharge. Pt has participated appropriately in sessions, however has limited engagement with skill building due to assertion that "until my brain chemistry is fixed this won't help." Pt requests to return to her previous providers for therapy and medication management. Pt has ECT consult appointment tomorrow, 07/05/17 at [facility]. Pt denies SI/HI at discharge.   Diagnosis: Severe recurrent major depression without psychotic features ([place]) [F33.2]    1. Severe recurrent major depression without psychotic features [facility])     Lorin Glass, LCSW 07/07/2017

## 2017-07-08 ENCOUNTER — Telehealth (HOSPITAL_COMMUNITY): Payer: Self-pay | Admitting: *Deleted

## 2017-07-08 ENCOUNTER — Other Ambulatory Visit (HOSPITAL_COMMUNITY): Payer: Self-pay

## 2017-07-08 NOTE — Telephone Encounter (Signed)
Called for prior authorization of ECT to [facility] at [phone]. Was unable to find medications that have been tried and failed in past except Sertraline and Viibryd. Also needing MADRS score and date obtained. Sent email to MD asking for further information needed to complete authorization. Will call back once information is given. Case ID [id number]

## 2017-07-11 ENCOUNTER — Encounter: Payer: Self-pay | Admitting: Anesthesiology

## 2017-07-11 ENCOUNTER — Encounter
Admission: RE | Admit: 2017-07-11 | Discharge: 2017-07-11 | Disposition: A | Discharge status: Home / Self Care | Payer: BLUE CROSS/BLUE SHIELD | Source: Ambulatory Visit | Attending: Psychiatry | Admitting: Psychiatry

## 2017-07-11 ENCOUNTER — Other Ambulatory Visit (HOSPITAL_COMMUNITY): Payer: Self-pay

## 2017-07-11 ENCOUNTER — Other Ambulatory Visit: Payer: Self-pay | Admitting: Psychiatry

## 2017-07-11 ENCOUNTER — Institutional Professional Consult (permissible substitution): Payer: Self-pay | Admitting: Psychiatry

## 2017-07-11 DIAGNOSIS — F332 Major depressive disorder, recurrent severe without psychotic features: Secondary | ICD-10-CM

## 2017-07-11 DIAGNOSIS — M8580 Other specified disorders of bone density and structure, unspecified site: Secondary | ICD-10-CM | POA: Insufficient documentation

## 2017-07-11 DIAGNOSIS — F329 Major depressive disorder, single episode, unspecified: Secondary | ICD-10-CM | POA: Diagnosis not present

## 2017-07-11 DIAGNOSIS — N3289 Other specified disorders of bladder: Secondary | ICD-10-CM | POA: Insufficient documentation

## 2017-07-11 DIAGNOSIS — R5383 Other fatigue: Secondary | ICD-10-CM | POA: Insufficient documentation

## 2017-07-11 DIAGNOSIS — R569 Unspecified convulsions: Secondary | ICD-10-CM | POA: Diagnosis not present

## 2017-07-11 DIAGNOSIS — F419 Anxiety disorder, unspecified: Secondary | ICD-10-CM | POA: Diagnosis not present

## 2017-07-11 DIAGNOSIS — Z85828 Personal history of other malignant neoplasm of skin: Secondary | ICD-10-CM | POA: Diagnosis not present

## 2017-07-11 LAB — POCT PREGNANCY, URINE: PREG TEST UR: NEGATIVE

## 2017-07-11 MED ORDER — SODIUM CHLORIDE 0.9 % IV SOLN
500.0000 mL | Freq: Once | INTRAVENOUS | Status: AC
  Administered 2017-07-11: 10:00:00 via INTRAVENOUS

## 2017-07-11 MED ORDER — SUCCINYLCHOLINE CHLORIDE 20 MG/ML IJ SOLN
INTRAMUSCULAR | Status: AC
  Filled 2017-07-11: qty 1

## 2017-07-11 MED ORDER — METHOHEXITAL SODIUM 100 MG/10ML IV SOSY
PREFILLED_SYRINGE | INTRAVENOUS | Status: DC | PRN
  Administered 2017-07-11: 70 mg via INTRAVENOUS

## 2017-07-11 MED ORDER — SUCCINYLCHOLINE CHLORIDE 200 MG/10ML IV SOSY
PREFILLED_SYRINGE | INTRAVENOUS | Status: DC | PRN
  Administered 2017-07-11: 80 mg via INTRAVENOUS

## 2017-07-11 MED ORDER — KETOROLAC TROMETHAMINE 30 MG/ML IJ SOLN
30.0000 mg | Freq: Once | INTRAMUSCULAR | Status: AC
  Administered 2017-07-11: 30 mg via INTRAVENOUS

## 2017-07-11 MED ORDER — KETOROLAC TROMETHAMINE 30 MG/ML IJ SOLN
INTRAMUSCULAR | Status: AC
  Administered 2017-07-11: 30 mg via INTRAVENOUS
  Filled 2017-07-11: qty 1

## 2017-07-11 NOTE — Discharge Instructions (Signed)
1)  The drugs that you have been given will stay in your system until tomorrow so for the       next 24 hours you should not:  A. Drive an automobile  B. Make any legal decisions  C. Drink any alcoholic beverages  2)  You may resume your regular meals upon return home.  3)  A responsible adult must take you home.  Someone should stay with you for a few          hours, then be available by phone for the remainder of the treatment day.  4)  You May experience any of the following symptoms:  Headache, Nausea and a dry mouth (due to the medications you were given),  temporary memory loss and some confusion, or sore muscles (a warm bath  should help this).  If you you experience any of these symptoms let us know on                your return visit.  5)  Report any of the following: any acute discomfort, severe headache, or temperature        greater than 100.5 F.   Also report any unusual redness, swelling, drainage, or pain         at your IV site.    You may report Symptoms to:  [place] [facility]          Phone: [phone], ECT Department           or Dr. Prescott Gum office [phone]  6)  Your next ECT Treatment is Day Wednesday  Date July 13, 2017 at 0830am  We will call 2 days prior to your scheduled appointment for arrival times.  7)  Nothing to eat or drink after midnight the night before your procedure.  8)  Take .    With a sip of water the morning of your procedure.  9)  Other Instructions: Call [phone] to cancel the morning of your procedure due         to illness or emergency.  10) We will call within 72 hours to assess how you are feeling.

## 2017-07-11 NOTE — Transfer of Care (Signed)
Immediate Anesthesia Transfer of Care Note  Patient: Autumn Foster  Procedure(s) Performed: * No procedures listed *  Patient Location: PACU  Anesthesia Type:General  Level of Consciousness: awake and alert   Airway & Oxygen Therapy: Spontaneous breathing, mask  Post-op Assessment: Report given to RN and Post -op Vital signs reviewed and stable  Post vital signs: Reviewed and stable  Last Vitals:  Vitals:   07/11/17 1054 07/11/17 1055  BP: 117/80 117/80  Pulse: 77 81  Resp:  (!) 23  Temp:  36.6 C  SpO2: 100% 100%    Last Pain:  Vitals:   07/11/17 0814  TempSrc: Oral         Complications: No apparent anesthesia complications

## 2017-07-11 NOTE — Procedures (Signed)
ECT SERVICES Physician's Interval Evaluation & Treatment Note  Patient Identification: Autumn Foster MRN:  [id number] Date of Evaluation:  07/11/2017 TX #: 1  MADRS: 36  MMSE: 30  P.E. Findings:  No change to physical exam unremarkable findings. Vitals normal heart and lungs clear  Psychiatric Interval Note:  Depressed flat withdrawn lucid however no active suicidal intention  Subjective:  Patient is a 51 y.o. female seen for evaluation for Electroconvulsive Therapy. Depression  Treatment Summary:   []   Right Unilateral             [x]  Bilateral   % Energy : 1.0 ms 30%   Impedance: 1360 ohms  Seizure Energy Index: 12,523 V squared  Postictal Suppression Index: 37%  Seizure Concordance Index: 97%  Medications  Pre Shock: Toradol 30 mg Brevital 70 mg succinylcholine 80 mg  Post Shock:    Seizure Duration: 79 seconds by EMG 105 seconds by EEG   Comments: Follow-up 3 times a week treatment no need to increase stimulus parameters at this point. Medications seem appropriate.   Lungs:  [x]   Clear to auscultation               []  Other:   Heart:    [x]   Regular rhythm             []  irregular rhythm    [x]   Previous H&P reviewed, patient examined and there are NO CHANGES                 []   Previous H&P reviewed, patient examined and there are changes noted.   Alethia Berthold, MD 9/17/201810:32 AM

## 2017-07-11 NOTE — Anesthesia Post-op Follow-up Note (Signed)
Anesthesia QCDR form completed.        

## 2017-07-11 NOTE — Anesthesia Preprocedure Evaluation (Signed)
Anesthesia Evaluation  Patient identified by MRN, date of birth, ID band Patient awake    Reviewed: Allergy & Precautions, NPO status , Patient's Chart, lab work & pertinent test results  History of Anesthesia Complications Negative for: history of anesthetic complications  Airway Mallampati: II       Dental   Pulmonary neg sleep apnea, neg COPD,           Cardiovascular (-) hypertension(-) Past MI and (-) CHF (-) dysrhythmias (-) Valvular Problems/Murmurs     Neuro/Psych neg Seizures Anxiety Depression    GI/Hepatic Neg liver ROS, neg GERD  ,  Endo/Other  neg diabetes  Renal/GU negative Renal ROS     Musculoskeletal   Abdominal   Peds  Hematology  (+) anemia ,   Anesthesia Other Findings   Reproductive/Obstetrics                             Anesthesia Physical Anesthesia Plan  ASA: II  Anesthesia Plan: General   Post-op Pain Management:    Induction:   PONV Risk Score and Plan: 3 and Ondansetron, Dexamethasone, Midazolam and Treatment may vary due to age or medical condition  Airway Management Planned: Mask  Additional Equipment:   Intra-op Plan:   Post-operative Plan:   Informed Consent: I have reviewed the patients History and Physical, chart, labs and discussed the procedure including the risks, benefits and alternatives for the proposed anesthesia with the patient or authorized representative who has indicated his/her understanding and acceptance.     Plan Discussed with:   Anesthesia Plan Comments:         Anesthesia Quick Evaluation

## 2017-07-11 NOTE — H&P (Signed)
Autumn Foster is an 51 y.o. female.   Chief Complaint: Patient is continuing to have significant depression fatigue lack of motivation and passive suicidal ideation HPI: History of severe depression unresponsive to multiple medicines  Past Medical History:  Diagnosis Date  . Anemia   . Anxiety   . ASCUS on Pap smear   . Bacterial infection   . Bladder spasm   . Blood in stool   . Cancer ([place])    Basal Cell Skin Cancer  . Candida vaginitis   . Cervical dysplasia   . Colon polyp    tubular adenoma  . Depression   . Dizziness 04/05/2016  . Dysmenorrhea   . Endometrial polyp   . Fatigue   . Genital warts   . Hemorrhoids   . Herpes   . History of chicken pox   . HPV (human papilloma virus) infection   . Low blood pressure   . Lyme disease   . Menorrhagia   . Orthostatic hypotension 04/05/2016  . Osteopenia   . Ovarian cyst   . PMDD (premenstrual dysphoric disorder)   . POTS (postural orthostatic tachycardia syndrome) 04/05/2016  . Rectal bleed   . Urinary frequency   . Urine incontinence   . Yeast infection     Past Surgical History:  Procedure Laterality Date  . COLONOSCOPY  12/29/2010   2 sessile polyps Dr. Fuller Plan  . DILATION AND CURETTAGE OF UTERUS    . HYSTEROSCOPY    . NOVASURE ABLATION      Family History  Problem Relation Age of Onset  . Prostate cancer Father   . Hypertension Father   . Renal cancer Father   . Migraines Mother   . Alcohol abuse Mother   . Suicidality Mother        deceased  . Colon cancer Paternal Grandfather   . Dementia Paternal Grandmother   . Thyroid disease Maternal Grandmother   . Rheum arthritis Maternal Grandmother   . Diabetes Maternal Grandfather   . Anxiety disorder Sister        x 2   Social History:  reports that she has never smoked. She has never used smokeless tobacco. She reports that she does not drink alcohol or use drugs.  Allergies:  Allergies  Allergen Reactions  . Acyclovir And Related   . Doxycycline    Fatigue  . Lexapro [Escitalopram Oxalate] Other (See Comments)    Insomnia   . Pristiq [Desvenlafaxine]     Dry heaves/dizzy  . Prozac [Fluoxetine Hcl]     HA and Strange Dreams  . Septra Ds [Sulfamethoxazole-Trimethoprim]     Jitteriness/hyper/affecting sleep  . Penicillins Rash    Childhood rash     (Not in a hospital admission)  Results for orders placed or performed during the hospital encounter of 07/11/17 (from the past 48 hour(s))  Pregnancy, urine POC     Status: None   Collection Time: 07/11/17 10:09 AM  Result Value Ref Range   Preg Test, Ur NEGATIVE NEGATIVE    Comment:        THE SENSITIVITY OF THIS METHODOLOGY IS >24 mIU/mL    No results found.  Review of Systems  Constitutional: Negative.   HENT: Negative.   Eyes: Negative.   Respiratory: Negative.   Cardiovascular: Negative.   Gastrointestinal: Negative.   Musculoskeletal: Negative.   Skin: Negative.   Neurological: Negative.   Psychiatric/Behavioral: Positive for depression and suicidal ideas. Negative for hallucinations, memory loss and substance abuse. The patient  is nervous/anxious and has insomnia.     Blood pressure 106/80, temperature 97.8 F (36.6 C), temperature source Oral, resp. rate 12, height 6' (1.829 m), weight 64.9 kg (143 lb), SpO2 98 %. Physical Exam  Nursing note and vitals reviewed. Constitutional: She appears well-developed and well-nourished.  HENT:  Head: Normocephalic and atraumatic.  Eyes: Pupils are equal, round, and reactive to light. Conjunctivae are normal.  Neck: Normal range of motion.  Cardiovascular: Normal rate, regular rhythm and normal heart sounds.   Respiratory: Effort normal. No respiratory distress.  GI: Soft.  Musculoskeletal: Normal range of motion.  Neurological: She is alert.  Skin: Skin is warm and dry.  Psychiatric: Judgment normal. Her affect is blunt. Her speech is delayed. She is slowed. Cognition and memory are normal. She exhibits a depressed  mood. She expresses suicidal ideation. She expresses no suicidal plans.     Assessment/Plan Follow-up Monday Wednesday Friday treatment at this point.  Alethia Berthold, MD 07/11/2017, 10:30 AM

## 2017-07-11 NOTE — Anesthesia Postprocedure Evaluation (Signed)
Anesthesia Post Note  Patient: ADELY FACER  Procedure(s) Performed: * No procedures listed *  Patient location during evaluation: PACU Anesthesia Type: General Level of consciousness: awake and alert Pain management: pain level controlled Vital Signs Assessment: post-procedure vital signs reviewed and stable Respiratory status: spontaneous breathing and respiratory function stable Cardiovascular status: stable Anesthetic complications: no     Last Vitals:  Vitals:   07/11/17 1115 07/11/17 1125  BP: 114/74 117/73  Pulse: 83 65  Resp: 12 10  Temp:  (!) 36.4 C  SpO2: 99% 98%    Last Pain:  Vitals:   07/11/17 1055  TempSrc:   PainSc: 0-No pain                 Angelena Sand K

## 2017-07-12 ENCOUNTER — Other Ambulatory Visit (HOSPITAL_COMMUNITY): Payer: Self-pay

## 2017-07-12 ENCOUNTER — Other Ambulatory Visit: Payer: Self-pay | Admitting: Psychiatry

## 2017-07-12 ENCOUNTER — Ambulatory Visit (HOSPITAL_COMMUNITY): Payer: Self-pay

## 2017-07-13 ENCOUNTER — Encounter
Admission: RE | Admit: 2017-07-13 | Discharge: 2017-07-13 | Disposition: A | Discharge status: Home / Self Care | Payer: BLUE CROSS/BLUE SHIELD | Source: Ambulatory Visit | Attending: Psychiatry | Admitting: Psychiatry

## 2017-07-13 ENCOUNTER — Other Ambulatory Visit (HOSPITAL_COMMUNITY): Payer: Self-pay

## 2017-07-13 ENCOUNTER — Encounter: Payer: Self-pay | Admitting: Anesthesiology

## 2017-07-13 DIAGNOSIS — F329 Major depressive disorder, single episode, unspecified: Secondary | ICD-10-CM | POA: Insufficient documentation

## 2017-07-13 DIAGNOSIS — M8580 Other specified disorders of bone density and structure, unspecified site: Secondary | ICD-10-CM | POA: Diagnosis not present

## 2017-07-13 DIAGNOSIS — F419 Anxiety disorder, unspecified: Secondary | ICD-10-CM | POA: Diagnosis not present

## 2017-07-13 DIAGNOSIS — F332 Major depressive disorder, recurrent severe without psychotic features: Secondary | ICD-10-CM

## 2017-07-13 DIAGNOSIS — N946 Dysmenorrhea, unspecified: Secondary | ICD-10-CM | POA: Insufficient documentation

## 2017-07-13 DIAGNOSIS — Z85828 Personal history of other malignant neoplasm of skin: Secondary | ICD-10-CM | POA: Diagnosis not present

## 2017-07-13 DIAGNOSIS — N3289 Other specified disorders of bladder: Secondary | ICD-10-CM | POA: Diagnosis not present

## 2017-07-13 MED ORDER — SUCCINYLCHOLINE CHLORIDE 200 MG/10ML IV SOSY
PREFILLED_SYRINGE | INTRAVENOUS | Status: DC | PRN
  Administered 2017-07-13: 80 mg via INTRAVENOUS

## 2017-07-13 MED ORDER — SODIUM CHLORIDE 0.9 % IV SOLN
INTRAVENOUS | Status: DC | PRN
  Administered 2017-07-13: 10:00:00 via INTRAVENOUS

## 2017-07-13 MED ORDER — ONDANSETRON HCL 4 MG/2ML IJ SOLN
4.0000 mg | Freq: Once | INTRAMUSCULAR | Status: AC
  Administered 2017-07-13: 4 mg via INTRAVENOUS

## 2017-07-13 MED ORDER — SODIUM CHLORIDE 0.9 % IV SOLN
500.0000 mL | Freq: Once | INTRAVENOUS | Status: AC
  Administered 2017-07-13: 09:00:00 via INTRAVENOUS

## 2017-07-13 MED ORDER — KETOROLAC TROMETHAMINE 30 MG/ML IJ SOLN
INTRAMUSCULAR | Status: AC
  Filled 2017-07-13: qty 1

## 2017-07-13 MED ORDER — ONDANSETRON HCL 4 MG/2ML IJ SOLN: 4.0000 mg | Freq: Once | INTRAMUSCULAR | Status: DC | PRN

## 2017-07-13 MED ORDER — FENTANYL CITRATE (PF) 100 MCG/2ML IJ SOLN: 25.0000 ug | INTRAMUSCULAR | Status: DC | PRN

## 2017-07-13 MED ORDER — ONDANSETRON HCL 4 MG/2ML IJ SOLN
INTRAMUSCULAR | Status: AC
  Filled 2017-07-13: qty 2

## 2017-07-13 MED ORDER — MIDAZOLAM HCL 2 MG/2ML IJ SOLN
INTRAMUSCULAR | Status: AC
  Filled 2017-07-13: qty 2

## 2017-07-13 MED ORDER — KETOROLAC TROMETHAMINE 30 MG/ML IJ SOLN
30.0000 mg | Freq: Once | INTRAMUSCULAR | Status: AC
  Administered 2017-07-13: 30 mg via INTRAVENOUS

## 2017-07-13 MED ORDER — METHOHEXITAL SODIUM 100 MG/10ML IV SOSY
PREFILLED_SYRINGE | INTRAVENOUS | Status: DC | PRN
  Administered 2017-07-13: 70 mg via INTRAVENOUS

## 2017-07-13 MED ORDER — MIDAZOLAM HCL 2 MG/2ML IJ SOLN
1.0000 mg | Freq: Once | INTRAMUSCULAR | Status: AC
  Administered 2017-07-13: 1 mg via INTRAVENOUS

## 2017-07-13 MED ORDER — SUCCINYLCHOLINE CHLORIDE 20 MG/ML IJ SOLN
INTRAMUSCULAR | Status: AC
  Filled 2017-07-13: qty 1

## 2017-07-13 NOTE — H&P (Signed)
Autumn Foster is an 51 y.o. female.   Chief Complaint: Continues to feel depressed. Some nausea and soreness HPI: History of recurrent severe depression  Past Medical History:  Diagnosis Date  . Anemia   . Anxiety   . ASCUS on Pap smear   . Bacterial infection   . Bladder spasm   . Blood in stool   . Cancer ([place])    Basal Cell Skin Cancer  . Candida vaginitis   . Cervical dysplasia   . Colon polyp    tubular adenoma  . Depression   . Dizziness 04/05/2016  . Dysmenorrhea   . Endometrial polyp   . Fatigue   . Genital warts   . Hemorrhoids   . Herpes   . History of chicken pox   . HPV (human papilloma virus) infection   . Low blood pressure   . Lyme disease   . Menorrhagia   . Orthostatic hypotension 04/05/2016  . Osteopenia   . Ovarian cyst   . PMDD (premenstrual dysphoric disorder)   . POTS (postural orthostatic tachycardia syndrome) 04/05/2016  . Rectal bleed   . Urinary frequency   . Urine incontinence   . Yeast infection     Past Surgical History:  Procedure Laterality Date  . COLONOSCOPY  12/29/2010   2 sessile polyps Dr. Fuller Plan  . DILATION AND CURETTAGE OF UTERUS    . HYSTEROSCOPY    . NOVASURE ABLATION      Family History  Problem Relation Age of Onset  . Prostate cancer Father   . Hypertension Father   . Renal cancer Father   . Migraines Mother   . Alcohol abuse Mother   . Suicidality Mother        deceased  . Colon cancer Paternal Grandfather   . Dementia Paternal Grandmother   . Thyroid disease Maternal Grandmother   . Rheum arthritis Maternal Grandmother   . Diabetes Maternal Grandfather   . Anxiety disorder Sister        x 2   Social History:  reports that she has never smoked. She has never used smokeless tobacco. She reports that she does not drink alcohol or use drugs.  Allergies:  Allergies  Allergen Reactions  . Acyclovir And Related   . Doxycycline     Fatigue  . Lexapro [Escitalopram Oxalate] Other (See Comments)    Insomnia    . Pristiq [Desvenlafaxine]     Dry heaves/dizzy  . Prozac [Fluoxetine Hcl]     HA and Strange Dreams  . Septra Ds [Sulfamethoxazole-Trimethoprim]     Jitteriness/hyper/affecting sleep  . Penicillins Rash    Childhood rash     (Not in a hospital admission)  No results found for this or any previous visit (from the past 48 hour(s)). No results found.  Review of Systems  Constitutional: Negative.   HENT: Negative.   Eyes: Negative.   Respiratory: Negative.   Cardiovascular: Negative.   Gastrointestinal: Negative.   Musculoskeletal: Positive for myalgias.  Skin: Negative.   Neurological: Negative.   Psychiatric/Behavioral: Positive for depression. Negative for hallucinations, memory loss, substance abuse and suicidal ideas. The patient is not nervous/anxious and does not have insomnia.     Blood pressure 107/68, pulse (!) 58, temperature (!) 97.5 F (36.4 C), temperature source Oral, resp. rate 18, height 6' (1.829 m), weight 65.8 kg (145 lb), SpO2 98 %. Physical Exam  Nursing note and vitals reviewed. Constitutional: She appears well-developed and well-nourished.  HENT:  Head: Normocephalic and  atraumatic.  Eyes: Pupils are equal, round, and reactive to light. Conjunctivae are normal.  Neck: Normal range of motion.  Cardiovascular: Regular rhythm and normal heart sounds.   Respiratory: Effort normal and breath sounds normal. No respiratory distress.  GI: Soft.  Musculoskeletal: Normal range of motion.  Neurological: She is alert.  Skin: Skin is warm and dry.  Psychiatric: Thought content normal. Her affect is blunt. Her speech is delayed. She is slowed. Cognition and memory are normal.     Assessment/Plan Continue bilateral treatment with modifications to address side effects. Today his treatment number to continue full course  Alethia Berthold, MD 07/13/2017, 10:36 AM

## 2017-07-13 NOTE — Procedures (Signed)
ECT SERVICES Physician's Interval Evaluation & Treatment Note  Patient Identification: Autumn Foster MRN:  [id number] Date of Evaluation:  07/13/2017 TX #: 2  MADRS:   MMSE:   P.E. Findings:  No change to physical. Heart and lungs normal. Vitals normal.  Psychiatric Interval Note:  Mood essentially the same possibly slightly better.  Subjective:  Patient is a 51 y.o. female seen for evaluation for Electroconvulsive Therapy. Had some soreness and nausea with previous treatment. Gradually improving. No other specific new complaint  Treatment Summary:   []   Right Unilateral             []  Bilateral   % Energy : 1.0 ms 30%   Impedance: 1240 ohms  Seizure Energy Index: 11,563 V squared  Postictal Suppression Index: 80%  Seizure Concordance Index: 98%  Medications  Pre Shock: Toradol 30 mg, Zofran 4 mg, Brevital 70 mg, succinylcholine 80 mg   Post Shock: Versed 1 mg  Seizure Duration: 45 seconds by EMG 50 seconds by EEG   Comments: Follow-up full course Monday Wednesday and Friday   Lungs:  [x]   Clear to auscultation               []  Other:   Heart:    [x]   Regular rhythm             []  irregular rhythm    [x]   Previous H&P reviewed, patient examined and there are NO CHANGES                 []   Previous H&P reviewed, patient examined and there are changes noted.   Alethia Berthold, MD 9/19/201810:43 AM

## 2017-07-13 NOTE — Anesthesia Post-op Follow-up Note (Signed)
Anesthesia QCDR form completed.        

## 2017-07-13 NOTE — Anesthesia Procedure Notes (Signed)
Performed by: Dionne Bucy Pre-anesthesia Checklist: Patient identified, Emergency Drugs available, Suction available and Patient being monitored Patient Re-evaluated:Patient Re-evaluated prior to induction Oxygen Delivery Method: Circle system utilized Preoxygenation: Pre-oxygenation with 100% oxygen Induction Type: IV induction Ventilation: Mask ventilation without difficulty and Mask ventilation throughout procedure Airway Equipment and Method: Bite block Placement Confirmation: positive ETCO2 Dental Injury: Teeth and Oropharynx as per pre-operative assessment

## 2017-07-13 NOTE — Anesthesia Preprocedure Evaluation (Signed)
Anesthesia Evaluation  Patient identified by MRN, date of birth, ID band Patient awake    Reviewed: Allergy & Precautions, NPO status , Patient's Chart, lab work & pertinent test results  History of Anesthesia Complications Negative for: history of anesthetic complications  Airway Mallampati: II       Dental  (+) Teeth Intact   Pulmonary neg sleep apnea, neg COPD,    Pulmonary exam normal        Cardiovascular (-) hypertension(-) Past MI and (-) CHF Normal cardiovascular exam(-) dysrhythmias (-) Valvular Problems/Murmurs     Neuro/Psych neg Seizures PSYCHIATRIC DISORDERS Anxiety Depression    GI/Hepatic Neg liver ROS, neg GERD  ,Colon polyps   Endo/Other  neg diabetes  Renal/GU negative Renal ROS Bladder dysfunction Female GU complaint     Musculoskeletal   Abdominal Normal abdominal exam  (+)   Peds  Hematology  (+) anemia ,   Anesthesia Other Findings Past Medical History: No date: Anemia No date: Anxiety No date: ASCUS on Pap smear No date: Bacterial infection No date: Bladder spasm No date: Blood in stool No date: [facility])     Comment:  Basal Cell Skin Cancer No date: Candida vaginitis No date: Cervical dysplasia No date: Colon polyp     Comment:  tubular adenoma No date: Depression 04/05/2016: Dizziness No date: Dysmenorrhea No date: Endometrial polyp No date: Fatigue No date: Genital warts No date: Hemorrhoids No date: Herpes No date: History of chicken pox No date: HPV (human papilloma virus) infection No date: Low blood pressure No date: Lyme disease No date: Menorrhagia 04/05/2016: Orthostatic hypotension No date: Osteopenia No date: Ovarian cyst No date: PMDD (premenstrual dysphoric disorder) 04/05/2016: POTS (postural orthostatic tachycardia syndrome) No date: Rectal bleed No date: Urinary frequency No date: Urine incontinence No date: Yeast infection   Reproductive/Obstetrics                             Anesthesia Physical  Anesthesia Plan  ASA: II  Anesthesia Plan: General   Post-op Pain Management:    Induction:   PONV Risk Score and Plan: 3 and Ondansetron, Dexamethasone, Midazolam and Treatment may vary due to age or medical condition  Airway Management Planned: Mask  Additional Equipment:   Intra-op Plan:   Post-operative Plan:   Informed Consent: I have reviewed the patients History and Physical, chart, labs and discussed the procedure including the risks, benefits and alternatives for the proposed anesthesia with the patient or authorized representative who has indicated his/her understanding and acceptance.   Dental advisory given  Plan Discussed with: CRNA and Surgeon  Anesthesia Plan Comments:         Anesthesia Quick Evaluation

## 2017-07-13 NOTE — Transfer of Care (Signed)
Immediate Anesthesia Transfer of Care Note  Patient: Autumn Foster  Procedure(s) Performed: ECT  Patient Location: PACU  Anesthesia Type:General  Level of Consciousness: sedated  Airway & Oxygen Therapy: Patient Spontanous Breathing  Post-op Assessment: Report given to RN and Post -op Vital signs reviewed and stable  Post vital signs: Reviewed and stable  Last Vitals:  Vitals:   07/13/17 0838 07/13/17 1103  BP: 107/68 100/61  Pulse: (!) 58 76  Resp: 18 15  Temp: (!) 36.4 C 37.1 C  SpO2: 98% 100%    Last Pain:  Vitals:   07/13/17 1103  TempSrc:   PainSc: Asleep         Complications: No apparent anesthesia complications

## 2017-07-13 NOTE — Discharge Instructions (Signed)
1)  The drugs that you have been given will stay in your system until tomorrow so for the       next 24 hours you should not:  A. Drive an automobile  B. Make any legal decisions  C. Drink any alcoholic beverages  2)  You may resume your regular meals upon return home.  3)  A responsible adult must take you home.  Someone should stay with you for a few          hours, then be available by phone for the remainder of the treatment day.  4)  You May experience any of the following symptoms:  Headache, Nausea and a dry mouth (due to the medications you were given),  temporary memory loss and some confusion, or sore muscles (a warm bath  should help this).  If you you experience any of these symptoms let us know on                your return visit.  5)  Report any of the following: any acute discomfort, severe headache, or temperature        greater than 100.5 F.   Also report any unusual redness, swelling, drainage, or pain         at your IV site.    You may report Symptoms to:  [place] [facility]          Phone: [phone], ECT Department           or Dr. Prescott Gum office [phone]  6)  Your next ECT Treatment is Day Friday  Date July 15, 2017 at 830am  We will call 2 days prior to your scheduled appointment for arrival times.  7)  Nothing to eat or drink after midnight the night before your procedure.  8)  Take .   With a sip of water the morning of your procedure.  9)  Other Instructions: Call [phone] to cancel the morning of your procedure due         to illness or emergency.  10) We will call within 72 hours to assess how you are feeling.

## 2017-07-13 NOTE — Anesthesia Postprocedure Evaluation (Signed)
Anesthesia Post Note  Patient: Autumn Foster  Procedure(s) Performed: * No procedures listed *  Patient location during evaluation: PACU Anesthesia Type: General Level of consciousness: awake and alert and oriented Pain management: pain level controlled Vital Signs Assessment: post-procedure vital signs reviewed and stable Respiratory status: spontaneous breathing Cardiovascular status: blood pressure returned to baseline Anesthetic complications: no     Last Vitals:  Vitals:   07/13/17 1133 07/13/17 1139  BP: 110/68 119/79  Pulse: 70 72  Resp: 17 18  Temp: 36.9 C   SpO2: 99%     Last Pain:  Vitals:   07/13/17 1103  TempSrc:   PainSc: Asleep                 Maevyn Riordan

## 2017-07-14 ENCOUNTER — Other Ambulatory Visit: Payer: Self-pay | Admitting: Psychiatry

## 2017-07-14 ENCOUNTER — Ambulatory Visit (HOSPITAL_COMMUNITY): Payer: Self-pay

## 2017-07-14 ENCOUNTER — Other Ambulatory Visit (HOSPITAL_COMMUNITY): Payer: Self-pay

## 2017-07-14 ENCOUNTER — Telehealth (HOSPITAL_COMMUNITY): Payer: Self-pay | Admitting: *Deleted

## 2017-07-14 NOTE — Telephone Encounter (Signed)
Called for authorization of ECT. Spoke with Dondra Spry who gave approval from 9/17-12/31/18 for 12 sessions. Auth #[id number].

## 2017-07-15 ENCOUNTER — Other Ambulatory Visit (HOSPITAL_COMMUNITY): Payer: Self-pay

## 2017-07-15 ENCOUNTER — Encounter: Payer: Self-pay | Admitting: Anesthesiology

## 2017-07-15 ENCOUNTER — Encounter (HOSPITAL_BASED_OUTPATIENT_CLINIC_OR_DEPARTMENT_OTHER)
Admission: RE | Admit: 2017-07-15 | Discharge: 2017-07-15 | Disposition: A | Discharge status: Home / Self Care | Payer: BLUE CROSS/BLUE SHIELD | Source: Ambulatory Visit | Attending: Psychiatry | Admitting: Psychiatry

## 2017-07-15 DIAGNOSIS — F332 Major depressive disorder, recurrent severe without psychotic features: Secondary | ICD-10-CM

## 2017-07-15 DIAGNOSIS — F329 Major depressive disorder, single episode, unspecified: Secondary | ICD-10-CM | POA: Diagnosis not present

## 2017-07-15 LAB — POCT PREGNANCY, URINE: Preg Test, Ur: NEGATIVE

## 2017-07-15 MED ORDER — KETOROLAC TROMETHAMINE 30 MG/ML IJ SOLN
INTRAMUSCULAR | Status: AC
  Administered 2017-07-15: 30 mg via INTRAVENOUS
  Filled 2017-07-15: qty 1

## 2017-07-15 MED ORDER — ONDANSETRON HCL 4 MG/2ML IJ SOLN
4.0000 mg | Freq: Once | INTRAMUSCULAR | Status: AC
  Administered 2017-07-15: 4 mg via INTRAVENOUS

## 2017-07-15 MED ORDER — ONDANSETRON HCL 4 MG/2ML IJ SOLN: 4.0000 mg | Freq: Once | INTRAMUSCULAR | Status: DC | PRN

## 2017-07-15 MED ORDER — KETOROLAC TROMETHAMINE 30 MG/ML IJ SOLN
30.0000 mg | Freq: Once | INTRAMUSCULAR | Status: AC
  Administered 2017-07-15: 30 mg via INTRAVENOUS

## 2017-07-15 MED ORDER — MIDAZOLAM HCL 2 MG/2ML IJ SOLN
INTRAMUSCULAR | Status: AC
  Filled 2017-07-15: qty 2

## 2017-07-15 MED ORDER — METHOHEXITAL SODIUM 100 MG/10ML IV SOSY
PREFILLED_SYRINGE | INTRAVENOUS | Status: DC | PRN
  Administered 2017-07-15: 70 mg via INTRAVENOUS

## 2017-07-15 MED ORDER — FENTANYL CITRATE (PF) 100 MCG/2ML IJ SOLN: 25.0000 ug | INTRAMUSCULAR | Status: DC | PRN

## 2017-07-15 MED ORDER — SUCCINYLCHOLINE CHLORIDE 200 MG/10ML IV SOSY
PREFILLED_SYRINGE | INTRAVENOUS | Status: DC | PRN
  Administered 2017-07-15: 80 mg via INTRAVENOUS

## 2017-07-15 MED ORDER — SODIUM CHLORIDE 0.9 % IV SOLN
500.0000 mL | Freq: Once | INTRAVENOUS | Status: AC
  Administered 2017-07-15: 500 mL via INTRAVENOUS

## 2017-07-15 MED ORDER — MIDAZOLAM HCL 2 MG/2ML IJ SOLN
1.0000 mg | Freq: Once | INTRAMUSCULAR | Status: AC
  Administered 2017-07-15: 1 mg via INTRAVENOUS

## 2017-07-15 MED ORDER — ONDANSETRON HCL 4 MG/2ML IJ SOLN
INTRAMUSCULAR | Status: AC
  Administered 2017-07-15: 4 mg via INTRAVENOUS
  Filled 2017-07-15: qty 2

## 2017-07-15 MED ORDER — SUCCINYLCHOLINE CHLORIDE 20 MG/ML IJ SOLN
INTRAMUSCULAR | Status: AC
  Filled 2017-07-15: qty 1

## 2017-07-15 MED ORDER — SODIUM CHLORIDE 0.9 % IV SOLN
INTRAVENOUS | Status: DC | PRN
  Administered 2017-07-15: 09:00:00 via INTRAVENOUS

## 2017-07-15 NOTE — Anesthesia Preprocedure Evaluation (Signed)
Anesthesia Evaluation  Patient identified by MRN, date of birth, ID band Patient awake    Reviewed: Allergy & Precautions, H&P , NPO status , Patient's Chart, lab work & pertinent test results, reviewed documented beta blocker date and time   Airway Mallampati: II   Neck ROM: full    Dental  (+) Poor Dentition   Pulmonary neg pulmonary ROS,    Pulmonary exam normal        Cardiovascular negative cardio ROS Normal cardiovascular exam Rhythm:regular Rate:Normal     Neuro/Psych PSYCHIATRIC DISORDERS negative neurological ROS  negative psych ROS   GI/Hepatic negative GI ROS, Neg liver ROS,   Endo/Other  negative endocrine ROS  Renal/GU negative Renal ROS  negative genitourinary   Musculoskeletal   Abdominal   Peds  Hematology negative hematology ROS (+) anemia ,   Anesthesia Other Findings Past Medical History: No date: Anemia No date: Anxiety No date: ASCUS on Pap smear No date: Bacterial infection No date: Bladder spasm No date: Blood in stool No date: [facility])     Comment:  Basal Cell Skin Cancer No date: Candida vaginitis No date: Cervical dysplasia No date: Colon polyp     Comment:  tubular adenoma No date: Depression 04/05/2016: Dizziness No date: Dysmenorrhea No date: Endometrial polyp No date: Fatigue No date: Genital warts No date: Hemorrhoids No date: Herpes No date: History of chicken pox No date: HPV (human papilloma virus) infection No date: Low blood pressure No date: Lyme disease No date: Menorrhagia 04/05/2016: Orthostatic hypotension No date: Osteopenia No date: Ovarian cyst No date: PMDD (premenstrual dysphoric disorder) 04/05/2016: POTS (postural orthostatic tachycardia syndrome) No date: Rectal bleed No date: Urinary frequency No date: Urine incontinence No date: Yeast infection Past Surgical History: 12/29/2010: COLONOSCOPY     Comment:  2 sessile polyps Dr. Fuller Plan No date:  DILATION AND CURETTAGE OF UTERUS No date: HYSTEROSCOPY No date: NOVASURE ABLATION BMI    Body Mass Index:  19.53 kg/m     Reproductive/Obstetrics negative OB ROS                             Anesthesia Physical Anesthesia Plan  ASA: III  Anesthesia Plan: General   Post-op Pain Management:    Induction:   PONV Risk Score and Plan:   Airway Management Planned:   Additional Equipment:   Intra-op Plan:   Post-operative Plan:   Informed Consent: I have reviewed the patients History and Physical, chart, labs and discussed the procedure including the risks, benefits and alternatives for the proposed anesthesia with the patient or authorized representative who has indicated his/her understanding and acceptance.   Dental Advisory Given  Plan Discussed with: CRNA  Anesthesia Plan Comments:         Anesthesia Quick Evaluation

## 2017-07-15 NOTE — Transfer of Care (Signed)
Immediate Anesthesia Transfer of Care Note  Patient: Autumn Foster  Procedure(s) Performed: ECT  Patient Location: PACU  Anesthesia Type:General  Level of Consciousness: sedated  Airway & Oxygen Therapy: Patient Spontanous Breathing and Patient connected to face mask oxygen  Post-op Assessment: Report given to RN and Post -op Vital signs reviewed and stable  Post vital signs: Reviewed and stable  Last Vitals:  Vitals:   07/15/17 0916 07/15/17 1125  BP: 111/71 107/70  Pulse: 72 72  Resp:  15  Temp: 36.9 C 36.8 C  SpO2: 99% 100%    Last Pain:  Vitals:   07/15/17 0916  TempSrc: Oral         Complications: No apparent anesthesia complications

## 2017-07-15 NOTE — Anesthesia Postprocedure Evaluation (Signed)
Anesthesia Post Note  Patient: Autumn Foster  Procedure(s) Performed: * No procedures listed *  Patient location during evaluation: PACU Anesthesia Type: General Level of consciousness: awake and alert and oriented Pain management: pain level controlled Vital Signs Assessment: post-procedure vital signs reviewed and stable Respiratory status: spontaneous breathing Cardiovascular status: blood pressure returned to baseline Anesthetic complications: no     Last Vitals:  Vitals:   07/15/17 1134 07/15/17 1212  BP: 114/71 116/72  Pulse: 62 64  Resp: 14 14  Temp:  36.8 C  SpO2: 100%     Last Pain:  Vitals:   07/15/17 1212  TempSrc: Oral  PainSc:                  Autumn Foster

## 2017-07-15 NOTE — Procedures (Signed)
ECT SERVICES Physician's Interval Evaluation & Treatment Note  Patient Identification: Autumn Foster MRN:  [id number] Date of Evaluation:  07/15/2017 TX #: 3  MADRS:   MMSE:   P.E. Findings:  No change to physical exam vitals normal heart and lungs normal.  Psychiatric Interval Note:  Affect a little blunted. Having significant memory impairment which is bothering her. No current suicidal thoughts.  Subjective:  Patient is a 51 y.o. female seen for evaluation for Electroconvulsive Therapy. Particular he bothered by some of the cognitive impairment  Treatment Summary:   []   Right Unilateral             [x]  Bilateral   % Energy : 1.0 ms 30%   Impedance: 1610 ohms  Seizure Energy Index: 9242 V squared  Postictal Suppression Index: 86%  Seizure Concordance Index: 93%  Medications  Pre Shock: Toradol 30 mg Zofran 4 mg Brevital 70 mg succinylcholine 80 mg  Post Shock: Versed 1 mg  Seizure Duration: 46 seconds by EMG 72 seconds by EEG   Comments: Follow-up Monday and then reassess the benefits versus the side effects   Lungs:  [x]   Clear to auscultation               []  Other:   Heart:    [x]   Regular rhythm             []  irregular rhythm    [x]   Previous H&P reviewed, patient examined and there are NO CHANGES                 []   Previous H&P reviewed, patient examined and there are changes noted.   Alethia Berthold, MD 9/21/201811:06 AM

## 2017-07-15 NOTE — H&P (Signed)
Autumn Foster is an 51 y.o. female.   Chief Complaint: Patient is having significant memory impairment. Bothered by it. Mood perhaps slightly better. Physical symptoms under better control. HPI: History of severe recurrent depression currently undergoing index ECT  Past Medical History:  Diagnosis Date  . Anemia   . Anxiety   . ASCUS on Pap smear   . Bacterial infection   . Bladder spasm   . Blood in stool   . Cancer ([place])    Basal Cell Skin Cancer  . Candida vaginitis   . Cervical dysplasia   . Colon polyp    tubular adenoma  . Depression   . Dizziness 04/05/2016  . Dysmenorrhea   . Endometrial polyp   . Fatigue   . Genital warts   . Hemorrhoids   . Herpes   . History of chicken pox   . HPV (human papilloma virus) infection   . Low blood pressure   . Lyme disease   . Menorrhagia   . Orthostatic hypotension 04/05/2016  . Osteopenia   . Ovarian cyst   . PMDD (premenstrual dysphoric disorder)   . POTS (postural orthostatic tachycardia syndrome) 04/05/2016  . Rectal bleed   . Urinary frequency   . Urine incontinence   . Yeast infection     Past Surgical History:  Procedure Laterality Date  . COLONOSCOPY  12/29/2010   2 sessile polyps Dr. Fuller Plan  . DILATION AND CURETTAGE OF UTERUS    . HYSTEROSCOPY    . NOVASURE ABLATION      Family History  Problem Relation Age of Onset  . Prostate cancer Father   . Hypertension Father   . Renal cancer Father   . Migraines Mother   . Alcohol abuse Mother   . Suicidality Mother        deceased  . Colon cancer Paternal Grandfather   . Dementia Paternal Grandmother   . Thyroid disease Maternal Grandmother   . Rheum arthritis Maternal Grandmother   . Diabetes Maternal Grandfather   . Anxiety disorder Sister        x 2   Social History:  reports that she has never smoked. She has never used smokeless tobacco. She reports that she does not drink alcohol or use drugs.  Allergies:  Allergies  Allergen Reactions  . Acyclovir  And Related   . Doxycycline     Fatigue  . Lexapro [Escitalopram Oxalate] Other (See Comments)    Insomnia   . Pristiq [Desvenlafaxine]     Dry heaves/dizzy  . Prozac [Fluoxetine Hcl]     HA and Strange Dreams  . Septra Ds [Sulfamethoxazole-Trimethoprim]     Jitteriness/hyper/affecting sleep  . Penicillins Rash    Childhood rash     (Not in a hospital admission)  Results for orders placed or performed during the hospital encounter of 07/15/17 (from the past 48 hour(s))  Pregnancy, urine POC     Status: None   Collection Time: 07/15/17 10:11 AM  Result Value Ref Range   Preg Test, Ur NEGATIVE NEGATIVE    Comment:        THE SENSITIVITY OF THIS METHODOLOGY IS >24 mIU/mL    No results found.  Review of Systems  Constitutional: Negative.   HENT: Negative.   Eyes: Negative.   Respiratory: Negative.   Cardiovascular: Negative.   Gastrointestinal: Negative.   Musculoskeletal: Negative.   Skin: Negative.   Neurological: Negative.   Psychiatric/Behavioral: Positive for depression and memory loss. Negative for hallucinations, substance  abuse and suicidal ideas. The patient is nervous/anxious. The patient does not have insomnia.     Blood pressure 111/71, pulse 72, temperature 98.4 F (36.9 C), temperature source Oral, height 6' (1.829 m), weight 65.3 kg (144 lb), SpO2 99 %. Physical Exam  Nursing note and vitals reviewed. Constitutional: She appears well-developed and well-nourished.  HENT:  Head: Normocephalic and atraumatic.  Eyes: Pupils are equal, round, and reactive to light. Conjunctivae are normal.  Neck: Normal range of motion.  Cardiovascular: Regular rhythm and normal heart sounds.   Respiratory: Effort normal and breath sounds normal. No respiratory distress.  GI: Soft.  Musculoskeletal: Normal range of motion.  Neurological: She is alert.  Skin: Skin is warm and dry.  Psychiatric: Her speech is normal and behavior is normal. Judgment normal. Her mood  appears anxious. Thought content is not paranoid. Cognition and memory are impaired. She exhibits a depressed mood. She expresses no homicidal and no suicidal ideation. She exhibits abnormal recent memory.     Assessment/Plan Treatment today follow-up on Monday at which point we can reassess whether to continue and on what schedule given her side effects  Alethia Berthold, MD 07/15/2017, 11:04 AM

## 2017-07-15 NOTE — Anesthesia Procedure Notes (Signed)
Date/Time: 07/15/2017 11:12 AM Performed by: Dionne Bucy Pre-anesthesia Checklist: Patient identified, Emergency Drugs available, Suction available and Patient being monitored Patient Re-evaluated:Patient Re-evaluated prior to induction Oxygen Delivery Method: Circle system utilized Preoxygenation: Pre-oxygenation with 100% oxygen Induction Type: IV induction Ventilation: Mask ventilation without difficulty and Mask ventilation throughout procedure Airway Equipment and Method: Bite block Placement Confirmation: positive ETCO2 Dental Injury: Teeth and Oropharynx as per pre-operative assessment

## 2017-07-15 NOTE — Anesthesia Post-op Follow-up Note (Signed)
Anesthesia QCDR form completed.        

## 2017-07-15 NOTE — Discharge Instructions (Signed)
1)  The drugs that you have been given will stay in your system until tomorrow so for the       next 24 hours you should not:  A. Drive an automobile  B. Make any legal decisions  C. Drink any alcoholic beverages  2)  You may resume your regular meals upon return home.  3)  A responsible adult must take you home.  Someone should stay with you for a few          hours, then be available by phone for the remainder of the treatment day.  4)  You May experience any of the following symptoms:  Headache, Nausea and a dry mouth (due to the medications you were given),  temporary memory loss and some confusion, or sore muscles (a warm bath  should help this).  If you you experience any of these symptoms let us know on                your return visit.  5)  Report any of the following: any acute discomfort, severe headache, or temperature        greater than 100.5 F.   Also report any unusual redness, swelling, drainage, or pain         at your IV site.    You may report Symptoms to:  [place] [facility]          Phone: [phone], ECT Department           or Dr. Prescott Gum office [phone]  6)  Your next ECT Treatment is Day Monday  Date July 18, 2017 at 830am  We will call 2 days prior to your scheduled appointment for arrival times.  7)  Nothing to eat or drink after midnight the night before your procedure.  8)  Take .     With a sip of water the morning of your procedure.  9)  Other Instructions: Call [phone] to cancel the morning of your procedure due         to illness or emergency.  10) We will call within 72 hours to assess how you are feeling.

## 2017-07-18 ENCOUNTER — Other Ambulatory Visit: Payer: Self-pay | Admitting: Psychiatry

## 2017-07-18 ENCOUNTER — Encounter: Payer: Self-pay | Admitting: Anesthesiology

## 2017-07-18 ENCOUNTER — Other Ambulatory Visit (HOSPITAL_COMMUNITY): Payer: Self-pay

## 2017-07-18 ENCOUNTER — Encounter (HOSPITAL_BASED_OUTPATIENT_CLINIC_OR_DEPARTMENT_OTHER)
Admission: RE | Admit: 2017-07-18 | Discharge: 2017-07-18 | Disposition: A | Discharge status: Home / Self Care | Payer: BLUE CROSS/BLUE SHIELD | Source: Ambulatory Visit | Attending: Psychiatry | Admitting: Psychiatry

## 2017-07-18 DIAGNOSIS — F329 Major depressive disorder, single episode, unspecified: Secondary | ICD-10-CM | POA: Diagnosis not present

## 2017-07-18 DIAGNOSIS — F332 Major depressive disorder, recurrent severe without psychotic features: Secondary | ICD-10-CM

## 2017-07-18 LAB — POCT PREGNANCY, URINE: PREG TEST UR: NEGATIVE

## 2017-07-18 MED ORDER — MIDAZOLAM HCL 5 MG/5ML IJ SOLN
INTRAMUSCULAR | Status: DC | PRN
  Administered 2017-07-18: 1 mg via INTRAVENOUS

## 2017-07-18 MED ORDER — SODIUM CHLORIDE 0.9 % IV SOLN
INTRAVENOUS | Status: DC | PRN
  Administered 2017-07-18: 11:00:00 via INTRAVENOUS

## 2017-07-18 MED ORDER — ONDANSETRON HCL 4 MG/2ML IJ SOLN
INTRAMUSCULAR | Status: AC
  Administered 2017-07-18: 4 mg via INTRAVENOUS
  Filled 2017-07-18: qty 2

## 2017-07-18 MED ORDER — ONDANSETRON HCL 4 MG/2ML IJ SOLN
4.0000 mg | Freq: Once | INTRAMUSCULAR | Status: AC
  Administered 2017-07-18: 4 mg via INTRAVENOUS

## 2017-07-18 MED ORDER — KETOROLAC TROMETHAMINE 30 MG/ML IJ SOLN
30.0000 mg | Freq: Once | INTRAMUSCULAR | Status: AC
  Administered 2017-07-18: 30 mg via INTRAVENOUS

## 2017-07-18 MED ORDER — ONDANSETRON HCL 4 MG/2ML IJ SOLN: 4.0000 mg | Freq: Once | INTRAMUSCULAR | Status: DC | PRN

## 2017-07-18 MED ORDER — FENTANYL CITRATE (PF) 100 MCG/2ML IJ SOLN: 25.0000 ug | INTRAMUSCULAR | Status: DC | PRN

## 2017-07-18 MED ORDER — KETOROLAC TROMETHAMINE 30 MG/ML IJ SOLN
INTRAMUSCULAR | Status: AC
  Administered 2017-07-18: 30 mg via INTRAVENOUS
  Filled 2017-07-18: qty 1

## 2017-07-18 MED ORDER — MIDAZOLAM HCL 2 MG/2ML IJ SOLN: 1.0000 mg | Freq: Once | INTRAMUSCULAR | Status: DC

## 2017-07-18 MED ORDER — MIDAZOLAM HCL 2 MG/2ML IJ SOLN
INTRAMUSCULAR | Status: AC
  Filled 2017-07-18: qty 2

## 2017-07-18 MED ORDER — SODIUM CHLORIDE 0.9 % IV SOLN
500.0000 mL | Freq: Once | INTRAVENOUS | Status: AC
  Administered 2017-07-18: 500 mL via INTRAVENOUS

## 2017-07-18 MED ORDER — METHOHEXITAL SODIUM 100 MG/10ML IV SOSY
PREFILLED_SYRINGE | INTRAVENOUS | Status: DC | PRN
  Administered 2017-07-18: 70 mg via INTRAVENOUS

## 2017-07-18 MED ORDER — SUCCINYLCHOLINE CHLORIDE 20 MG/ML IJ SOLN
INTRAMUSCULAR | Status: DC | PRN
  Administered 2017-07-18: 80 mg via INTRAVENOUS

## 2017-07-18 NOTE — Procedures (Signed)
ECT SERVICES Physician's Interval Evaluation & Treatment Note  Patient Identification: Autumn Foster MRN:  [id number] Date of Evaluation:  07/18/2017 TX #: 4  MADRS: 21  MMSE: 30  P.E. Findings:  No change to physical exam vitals normal heart and lungs normal  Psychiatric Interval Note:  Mood is improved that she is quite disturbed by the degree of confusion and memory loss that she has. No suicidal thoughts.  Subjective:  Patient is a 51 y.o. female seen for evaluation for Electroconvulsive Therapy. Disturbed by memory loss  Treatment Summary:   []   Right Unilateral             []  Bilateral   % Energy : 1.0 ms 30%   Impedance: 2330 ohms  Seizure Energy Index: 21,199 V squared  Postictal Suppression Index: Less than 10%  Seizure Concordance Index: 86%  Medications  Pre Shock: Toradol 30 mg Zofran 4 mg Brevital 70 mg succinylcholine 80 mg  Post Shock: Versed 1 mg  Seizure Duration: 50 seconds by EMG 65 seconds by EEG   Comments: Doing well with treatment but bothered by the memory impairment. Because the combination of her schedule and hours we have decided to stop treatment after today until one week from Wednesday which will be October 3 this will give her time for her memory to improve   Lungs:  [x]   Clear to auscultation               []  Other:   Heart:    [x]   Regular rhythm             []  irregular rhythm    [x]   Previous H&P reviewed, patient examined and there are NO CHANGES                 []   Previous H&P reviewed, patient examined and there are changes noted.   Alethia Berthold, MD 9/24/201810:57 AM

## 2017-07-18 NOTE — Anesthesia Postprocedure Evaluation (Signed)
Anesthesia Post Note  Patient: [name]  Procedure(s) Performed: * No procedures listed *  Patient location during evaluation: PACU Anesthesia Type: General Level of consciousness: awake and alert Pain management: pain level controlled Vital Signs Assessment: post-procedure vital signs reviewed and stable Respiratory status: spontaneous breathing, nonlabored ventilation, respiratory function stable and patient connected to nasal cannula oxygen Cardiovascular status: blood pressure returned to baseline and stable Postop Assessment: no apparent nausea or vomiting Anesthetic complications: no     Last Vitals:  Vitals:   07/18/17 1131 07/18/17 1143  BP: 115/75 123/74  Pulse: 79 70  Resp: 18 18  Temp: 37 C   SpO2: 99%     Last Pain:  Vitals:   07/18/17 1131  TempSrc:   PainSc: 0-No pain                 Molli Barrows

## 2017-07-18 NOTE — Anesthesia Post-op Follow-up Note (Signed)
Anesthesia QCDR form completed.        

## 2017-07-18 NOTE — H&P (Signed)
Autumn Foster is an 51 y.o. female.   Chief Complaint: Patient is noticing significant memory impairment and is disturbed by it. Mood appears to be improved. HPI: Recurrent severe depression unresponsive to medication showing some response now with ECT  Past Medical History:  Diagnosis Date  . Anemia   . Anxiety   . ASCUS on Pap smear   . Bacterial infection   . Bladder spasm   . Blood in stool   . Cancer ([place])    Basal Cell Skin Cancer  . Candida vaginitis   . Cervical dysplasia   . Colon polyp    tubular adenoma  . Depression   . Dizziness 04/05/2016  . Dysmenorrhea   . Endometrial polyp   . Fatigue   . Genital warts   . Hemorrhoids   . Herpes   . History of chicken pox   . HPV (human papilloma virus) infection   . Low blood pressure   . Lyme disease   . Menorrhagia   . Orthostatic hypotension 04/05/2016  . Osteopenia   . Ovarian cyst   . PMDD (premenstrual dysphoric disorder)   . POTS (postural orthostatic tachycardia syndrome) 04/05/2016  . Rectal bleed   . Urinary frequency   . Urine incontinence   . Yeast infection     Past Surgical History:  Procedure Laterality Date  . COLONOSCOPY  12/29/2010   2 sessile polyps Dr. Fuller Plan  . DILATION AND CURETTAGE OF UTERUS    . HYSTEROSCOPY    . NOVASURE ABLATION      Family History  Problem Relation Age of Onset  . Prostate cancer Father   . Hypertension Father   . Renal cancer Father   . Migraines Mother   . Alcohol abuse Mother   . Suicidality Mother        deceased  . Colon cancer Paternal Grandfather   . Dementia Paternal Grandmother   . Thyroid disease Maternal Grandmother   . Rheum arthritis Maternal Grandmother   . Diabetes Maternal Grandfather   . Anxiety disorder Sister        x 2   Social History:  reports that she has never smoked. She has never used smokeless tobacco. She reports that she does not drink alcohol or use drugs.  Allergies:  Allergies  Allergen Reactions  . Acyclovir And Related    . Doxycycline     Fatigue  . Lexapro [Escitalopram Oxalate] Other (See Comments)    Insomnia   . Pristiq [Desvenlafaxine]     Dry heaves/dizzy  . Prozac [Fluoxetine Hcl]     HA and Strange Dreams  . Septra Ds [Sulfamethoxazole-Trimethoprim]     Jitteriness/hyper/affecting sleep  . Penicillins Rash    Childhood rash     (Not in a hospital admission)  Results for orders placed or performed during the hospital encounter of 07/18/17 (from the past 48 hour(s))  Pregnancy, urine POC     Status: None   Collection Time: 07/18/17  9:47 AM  Result Value Ref Range   Preg Test, Ur NEGATIVE NEGATIVE    Comment:        THE SENSITIVITY OF THIS METHODOLOGY IS >24 mIU/mL    No results found.  Review of Systems  Constitutional: Negative.   HENT: Negative.   Eyes: Negative.   Respiratory: Negative.   Cardiovascular: Negative.   Gastrointestinal: Negative.   Musculoskeletal: Negative.   Skin: Negative.   Neurological: Negative.   Psychiatric/Behavioral: Positive for memory loss. Negative for depression, hallucinations,  substance abuse and suicidal ideas. The patient is not nervous/anxious and does not have insomnia.     Blood pressure 119/78, pulse 64, temperature 98.5 F (36.9 C), temperature source Oral, resp. rate 18, height 6' (1.829 m), weight 65.3 kg (144 lb), SpO2 100 %. Physical Exam  Nursing note and vitals reviewed. Constitutional: She appears well-developed and well-nourished.  HENT:  Head: Normocephalic and atraumatic.  Eyes: Pupils are equal, round, and reactive to light. Conjunctivae are normal.  Neck: Normal range of motion.  Cardiovascular: Regular rhythm and normal heart sounds.   Respiratory: Effort normal. No respiratory distress.  GI: Soft.  Musculoskeletal: Normal range of motion.  Neurological: She is alert.  Skin: Skin is warm and dry.  Psychiatric: Her speech is normal and behavior is normal. Judgment normal. Her mood appears anxious. Thought content is  not paranoid. She expresses no homicidal and no suicidal ideation. She exhibits abnormal recent memory.     Assessment/Plan Because of her scheduling we are going to see her back in 1 week from Wednesday which will be on October 3. This will give time to have her memory improved  Alethia Berthold, MD 07/18/2017, 10:55 AM

## 2017-07-18 NOTE — Progress Notes (Signed)
Physical Abuse-Denied Verbal Abuse- Denied Sexual Abuse- Denied  Exploitation of patient- Denied Self-Neglect - Denied Cultural Request- None

## 2017-07-18 NOTE — Anesthesia Preprocedure Evaluation (Signed)
Anesthesia Evaluation  Patient identified by MRN, date of birth, ID band Patient awake    Reviewed: Allergy & Precautions, H&P , NPO status , Patient's Chart, lab work & pertinent test results, reviewed documented beta blocker date and time   Airway Mallampati: II   Neck ROM: full    Dental  (+) Poor Dentition, Teeth Intact   Pulmonary neg pulmonary ROS,    Pulmonary exam normal        Cardiovascular negative cardio ROS Normal cardiovascular exam Rhythm:regular Rate:Normal     Neuro/Psych PSYCHIATRIC DISORDERS negative neurological ROS  negative psych ROS   GI/Hepatic negative GI ROS, Neg liver ROS,   Endo/Other  negative endocrine ROS  Renal/GU negative Renal ROS  negative genitourinary   Musculoskeletal   Abdominal   Peds  Hematology negative hematology ROS (+) anemia ,   Anesthesia Other Findings Past Medical History: No date: Anemia No date: Anxiety No date: ASCUS on Pap smear No date: Bacterial infection No date: Bladder spasm No date: Blood in stool No date: Cancer [facility])     Comment:  Basal Cell Skin Cancer No date: Candida vaginitis No date: Cervical dysplasia No date: Colon polyp     Comment:  tubular adenoma No date: Depression 04/05/2016: Dizziness No date: Dysmenorrhea No date: Endometrial polyp No date: Fatigue No date: Genital warts No date: Hemorrhoids No date: Herpes No date: History of chicken pox No date: HPV (human papilloma virus) infection No date: Low blood pressure No date: Lyme disease No date: Menorrhagia 04/05/2016: Orthostatic hypotension No date: Osteopenia No date: Ovarian cyst No date: PMDD (premenstrual dysphoric disorder) 04/05/2016: POTS (postural orthostatic tachycardia syndrome) No date: Rectal bleed No date: Urinary frequency No date: Urine incontinence No date: Yeast infection Past Surgical History: 12/29/2010: COLONOSCOPY     Comment:  2 sessile polyps Dr.  Fuller Plan No date: DILATION AND CURETTAGE OF UTERUS No date: HYSTEROSCOPY No date: NOVASURE ABLATION BMI    Body Mass Index:  19.53 kg/m     Reproductive/Obstetrics negative OB ROS                             Anesthesia Physical Anesthesia Plan  ASA: III  Anesthesia Plan: General   Post-op Pain Management:    Induction:   PONV Risk Score and Plan:   Airway Management Planned:   Additional Equipment:   Intra-op Plan:   Post-operative Plan:   Informed Consent: I have reviewed the patients History and Physical, chart, labs and discussed the procedure including the risks, benefits and alternatives for the proposed anesthesia with the patient or authorized representative who has indicated his/her understanding and acceptance.   Dental Advisory Given  Plan Discussed with: CRNA  Anesthesia Plan Comments:         Anesthesia Quick Evaluation

## 2017-07-18 NOTE — Discharge Instructions (Addendum)
1)  The drugs that you have been given will stay in your system until tomorrow so for the       next 24 hours you should not:  A. Drive an automobile  B. Make any legal decisions  C. Drink any alcoholic beverages  2)  You may resume your regular meals upon return home.  3)  A responsible adult must take you home.  Someone should stay with you for a few          hours, then be available by phone for the remainder of the treatment day.  4)  You May experience any of the following symptoms:  Headache, Nausea and a dry mouth (due to the medications you were given),  temporary memory loss and some confusion, or sore muscles (a warm bath  should help this).  If you you experience any of these symptoms let us know on                your return visit.  5)  Report any of the following: any acute discomfort, severe headache, or temperature        greater than 100.5 F.   Also report any unusual redness, swelling, drainage, or pain         at your IV site.    You may report Symptoms to:  [place] [facility]          Phone: [phone], ECT Department           or Dr. Prescott Gum office [phone]  6)  Your next ECT Treatment is Wednesday July 27, 2017  We will call 2 days prior to your scheduled appointment for arrival times.  7)  Nothing to eat or drink after midnight the night before your procedure.  8)  Take    With a sip of water the morning of your procedure.  9)  Other Instructions: Call [phone] to cancel the morning of your procedure due         to illness or emergency.  10) We will call within 72 hours to assess how you are feeling.

## 2017-07-19 ENCOUNTER — Ambulatory Visit (HOSPITAL_COMMUNITY): Payer: Self-pay

## 2017-07-19 ENCOUNTER — Other Ambulatory Visit (HOSPITAL_COMMUNITY): Payer: Self-pay

## 2017-07-20 ENCOUNTER — Other Ambulatory Visit (HOSPITAL_COMMUNITY): Payer: Self-pay

## 2017-07-21 ENCOUNTER — Other Ambulatory Visit (HOSPITAL_COMMUNITY): Payer: Self-pay

## 2017-07-21 ENCOUNTER — Ambulatory Visit (HOSPITAL_COMMUNITY): Payer: Self-pay

## 2017-07-22 ENCOUNTER — Other Ambulatory Visit (HOSPITAL_COMMUNITY): Payer: Self-pay

## 2017-07-22 ENCOUNTER — Telehealth: Payer: Self-pay | Admitting: *Deleted

## 2017-07-27 ENCOUNTER — Telehealth: Payer: Self-pay

## 2017-08-01 ENCOUNTER — Ambulatory Visit: Payer: BLUE CROSS/BLUE SHIELD | Admitting: Gastroenterology

## 2017-09-05 ENCOUNTER — Telehealth: Payer: Self-pay | Admitting: Neurology

## 2017-09-05 NOTE — Telephone Encounter (Signed)
I spoke with Dr. Rexene Alberts and she reviewed the chart. She recommends that pt complete psychiatry/psychology testing and once the psychiatrist clears her, Dr. Rexene Alberts can see her again, but not until the psychological testing is complete. Meanwhile, pt should also follow up with PCP.  I called pt to discuss. No answer, left a message asking him to call me back.

## 2017-09-05 NOTE — Telephone Encounter (Signed)
Pt returned my call.  I explained to her that she would need to complete her psychologic/psychiatric testing and get clearance from her psychologist/psychiatrist before Dr. Rexene Alberts should see her again to rule out neurological concerns. Pt says that she is not having any testing at this time. Pt did decline her neuro-psych testing with Dr. Si Raider in March due to her depressive symptoms, and pt says that she will call Dr. Marcia Brash office to get this testing scheduled.

## 2017-09-05 NOTE — Telephone Encounter (Signed)
Pt said she is experiencing increased cognitive issues- memory/ brain fog, suicidal thoughts, increased severe depression. Pt said she has been seeing therapist, psychiatrist regularly but she is concerned about the memory issues. She is wanting to make sure there is nothing abnormal with the brain itself. Please call to advise

## 2017-09-05 NOTE — Telephone Encounter (Signed)
Nothing further needed, thank you. 

## 2017-09-07 ENCOUNTER — Encounter: Payer: Self-pay | Admitting: Psychology

## 2017-09-07 NOTE — Telephone Encounter (Signed)
Patient calling to advise she cannot get an appointment with Dr. Si Raider until mid April. Is there something that can be done to get in sooner?

## 2017-09-12 NOTE — Telephone Encounter (Signed)
Spoke to patient and and I relayed I could send her to Dr. Marlane Hatcher . Patient was fine this referral faxed . Telephone [phone] - fax [phone] .

## 2018-02-07 ENCOUNTER — Encounter: Payer: Self-pay | Admitting: Psychology

## 2018-02-16 ENCOUNTER — Encounter

## 2018-02-16 ENCOUNTER — Encounter: Payer: Self-pay | Admitting: Psychology

## 2018-04-10 ENCOUNTER — Telehealth: Payer: Self-pay

## 2018-04-10 NOTE — Telephone Encounter (Signed)
I called pt, per Dr. Rexene Alberts request, and advised her that the neuro-psych results were reviewed, and appear to be WNL. For the muscle twitching, Dr. Rexene Alberts has also completed a work up for that, and has nothing else to offer pt in that regard. Pt reports that she actually made the appt because she has been suffering from severe depression that has caused her to feel suicidal, and she wanted to know if there was anything neurological to be done to help with this. I advised pt that this is not something a neurologist office could help with, this is managed by psychiatry. Pt asked that her appt be cancelled for tomorrow.

## 2018-04-10 NOTE — Telephone Encounter (Signed)
Nothing further needed; thanks for calling patient for clarification.  Pt has had EEG, EMG/NCV, brain MRI and neuropsych evals in the past couple of years. All results WNL.

## 2018-04-11 ENCOUNTER — Ambulatory Visit: Payer: BLUE CROSS/BLUE SHIELD | Admitting: Neurology

## 2018-06-18 ENCOUNTER — Encounter (HOSPITAL_COMMUNITY): Payer: Self-pay

## 2018-06-18 ENCOUNTER — Other Ambulatory Visit: Payer: Self-pay

## 2018-06-18 ENCOUNTER — Inpatient Hospital Stay (HOSPITAL_COMMUNITY)
Admission: RE | Admit: 2018-06-18 | Discharge: 2018-06-22 | DRG: 885 | Disposition: A | Discharge status: Home / Self Care | Payer: BLUE CROSS/BLUE SHIELD | Attending: Psychiatry | Admitting: Psychiatry

## 2018-06-18 DIAGNOSIS — R45851 Suicidal ideations: Secondary | ICD-10-CM | POA: Diagnosis present

## 2018-06-18 DIAGNOSIS — L03115 Cellulitis of right lower limb: Secondary | ICD-10-CM | POA: Diagnosis present

## 2018-06-18 DIAGNOSIS — F332 Major depressive disorder, recurrent severe without psychotic features: Principal | ICD-10-CM | POA: Diagnosis present

## 2018-06-18 DIAGNOSIS — Z85828 Personal history of other malignant neoplasm of skin: Secondary | ICD-10-CM

## 2018-06-18 DIAGNOSIS — Z881 Allergy status to other antibiotic agents status: Secondary | ICD-10-CM

## 2018-06-18 DIAGNOSIS — Z888 Allergy status to other drugs, medicaments and biological substances status: Secondary | ICD-10-CM

## 2018-06-18 DIAGNOSIS — M8580 Other specified disorders of bone density and structure, unspecified site: Secondary | ICD-10-CM | POA: Diagnosis present

## 2018-06-18 DIAGNOSIS — W57XXXA Bitten or stung by nonvenomous insect and other nonvenomous arthropods, initial encounter: Secondary | ICD-10-CM | POA: Diagnosis present

## 2018-06-18 DIAGNOSIS — Z880 Allergy status to penicillin: Secondary | ICD-10-CM

## 2018-06-18 DIAGNOSIS — F419 Anxiety disorder, unspecified: Secondary | ICD-10-CM | POA: Diagnosis present

## 2018-06-18 DIAGNOSIS — Z818 Family history of other mental and behavioral disorders: Secondary | ICD-10-CM

## 2018-06-18 DIAGNOSIS — Z79899 Other long term (current) drug therapy: Secondary | ICD-10-CM

## 2018-06-18 DIAGNOSIS — Z811 Family history of alcohol abuse and dependence: Secondary | ICD-10-CM | POA: Diagnosis not present

## 2018-06-18 LAB — COMPREHENSIVE METABOLIC PANEL
ALK PHOS: 40 U/L (ref 38–126)
ALT: 16 U/L (ref 0–44)
AST: 20 U/L (ref 15–41)
Albumin: 4.6 g/dL (ref 3.5–5.0)
Anion gap: 10 (ref 5–15)
BUN: 17 mg/dL (ref 6–20)
CALCIUM: 9.7 mg/dL (ref 8.9–10.3)
CHLORIDE: 104 mmol/L (ref 98–111)
CO2: 24 mmol/L (ref 22–32)
CREATININE: 0.68 mg/dL (ref 0.44–1.00)
GFR calc Af Amer: >60 mL/min (ref >60)
GFR calc non Af Amer: >60 mL/min (ref >60)
Glucose, Bld: 134 mg/dL — ABNORMAL HIGH (ref 70–99)
Potassium: 4.2 mmol/L (ref 3.5–5.1)
SODIUM: 138 mmol/L (ref 135–145)
Total Bilirubin: 0.6 mg/dL (ref 0.3–1.2)
Total Protein: 7.1 g/dL (ref 6.5–8.1)

## 2018-06-18 LAB — LIPID PANEL
Cholesterol: 226 mg/dL — ABNORMAL HIGH (ref 0–200)
HDL: 75 mg/dL (ref >40)
LDL CALC: 143 mg/dL — AB (ref 0–99)
Total CHOL/HDL Ratio: 3 RATIO
Triglycerides: 41 mg/dL (ref <150)
VLDL: 8 mg/dL (ref 0–40)

## 2018-06-18 LAB — CBC
HCT: 40.6 % (ref 36.0–46.0)
HEMOGLOBIN: 13.6 g/dL (ref 12.0–15.0)
MCH: 31.4 pg (ref 26.0–34.0)
MCHC: 33.5 g/dL (ref 30.0–36.0)
MCV: 93.8 fL (ref 78.0–100.0)
PLATELETS: 309 10*3/uL (ref 150–400)
RBC: 4.33 MIL/uL (ref 3.87–5.11)
RDW: 12.6 % (ref 11.5–15.5)
WBC: 7.1 10*3/uL (ref 4.0–10.5)

## 2018-06-18 LAB — ETHANOL

## 2018-06-18 LAB — LITHIUM LEVEL: Lithium Lvl: 0.08 mmol/L — ABNORMAL LOW (ref 0.60–1.20)

## 2018-06-18 MED ORDER — LITHIUM CARBONATE 300 MG PO CAPS
300.0000 mg | ORAL_CAPSULE | Freq: Every day | ORAL | Status: DC
  Administered 2018-06-18: 300 mg via ORAL
  Filled 2018-06-18 (×3): qty 1

## 2018-06-18 MED ORDER — LIOTHYRONINE SODIUM 5 MCG PO TABS
5.0000 ug | ORAL_TABLET | Freq: Every day | ORAL | Status: DC
  Administered 2018-06-19: 5 ug via ORAL
  Filled 2018-06-18 (×4): qty 1

## 2018-06-18 MED ORDER — PROGESTERONE MICRONIZED 200 MG PO CAPS
200.0000 mg | ORAL_CAPSULE | Freq: Every day | ORAL | Status: DC
  Administered 2018-06-19 – 2018-06-20 (×2): 200 mg via ORAL
  Filled 2018-06-18 (×6): qty 1

## 2018-06-18 MED ORDER — LEVOTHYROXINE SODIUM 25 MCG PO TABS
25.0000 ug | ORAL_TABLET | Freq: Every day | ORAL | Status: DC
  Administered 2018-06-19 – 2018-06-22 (×4): 25 ug via ORAL
  Filled 2018-06-18 (×6): qty 1

## 2018-06-18 MED ORDER — DULOXETINE HCL 60 MG PO CPEP
60.0000 mg | ORAL_CAPSULE | Freq: Every day | ORAL | Status: DC
  Filled 2018-06-18 (×3): qty 1

## 2018-06-18 MED ORDER — HYDROXYZINE HCL 25 MG PO TABS
25.0000 mg | ORAL_TABLET | Freq: Four times a day (QID) | ORAL | Status: DC | PRN
  Administered 2018-06-18: 25 mg via ORAL
  Filled 2018-06-18: qty 1

## 2018-06-18 NOTE — BH Assessment (Signed)
Assessment Note  Autumn Foster is a 52 y.o. female in [facility] as a walk in accompanied by her husband, Dexter Sauser, due to ongoing depression and associated SI. Pt reports being depressed for @ 2 years and trying various treatments (ECT, EMDR, PHP, medication, therapy) to no avail. Pt feels that there is no hope for her and is tired of feeling depressed. After much pointed questioning (and much to pt's dismay and disapproval), pt's husband shared that, today, pt expressed to him that she planned on killing herself by cutting her wrists with a razor that she has access to. No HI or AVH.    Consulted case with Shuvon Rankin, NP, who also spoke with pt and husband. IP treatment is recommended for pt. Pt is accepted to [id number].   Diagnosis: F33.2 MDD, recurrent, severe, w/out psychotic features  Past Medical History:  Past Medical History:  Diagnosis Date  . Anemia   . Anxiety   . ASCUS on Pap smear   . Bacterial infection   . Bladder spasm   . Blood in stool   . Cancer ([place])    Basal Cell Skin Cancer  . Candida vaginitis   . Cervical dysplasia   . Colon polyp    tubular adenoma  . Depression   . Dizziness 04/05/2016  . Dysmenorrhea   . Endometrial polyp   . Fatigue   . Genital warts   . Hemorrhoids   . Herpes   . History of chicken pox   . HPV (human papilloma virus) infection   . Low blood pressure   . Lyme disease   . Menorrhagia   . Orthostatic hypotension 04/05/2016  . Osteopenia   . Ovarian cyst   . PMDD (premenstrual dysphoric disorder)   . POTS (postural orthostatic tachycardia syndrome) 04/05/2016  . Rectal bleed   . Urinary frequency   . Urine incontinence   . Yeast infection     Past Surgical History:  Procedure Laterality Date  . COLONOSCOPY  12/29/2010   2 sessile polyps Dr. Fuller Plan  . DILATION AND CURETTAGE OF UTERUS    . HYSTEROSCOPY    . NOVASURE ABLATION      Family History:  Family History  Problem Relation Age of Onset  . Prostate cancer Father    . Hypertension Father   . Renal cancer Father   . Migraines Mother   . Alcohol abuse Mother   . Suicidality Mother        deceased  . Colon cancer Paternal Grandfather   . Dementia Paternal Grandmother   . Thyroid disease Maternal Grandmother   . Rheum arthritis Maternal Grandmother   . Diabetes Maternal Grandfather   . Anxiety disorder Sister        x 2    Social History:  reports that she has never smoked. She has never used smokeless tobacco. She reports that she does not drink alcohol or use drugs.  Additional Social History:  Alcohol / Drug Use Pain Medications: none noted Prescriptions: Cymbalta 60mg ; Lithium 300 mg; Valtrex 500 mg; Synthroid 25 mg; Cytomel 5mg ; Progesterone 300mg  Over the Counter: none noted History of alcohol / drug use?: No history of alcohol / drug abuse  CIWA:   COWS:    Allergies:  Allergies  Allergen Reactions  . Acyclovir And Related   . Doxycycline     Fatigue  . Lexapro [Escitalopram Oxalate] Other (See Comments)    Insomnia   . Pristiq [Desvenlafaxine]     Dry  heaves/dizzy  . Prozac [Fluoxetine Hcl]     HA and Strange Dreams  . Septra Ds [Sulfamethoxazole-Trimethoprim]     Jitteriness/hyper/affecting sleep  . Penicillins Rash    Childhood rash    Home Medications:  No medications prior to admission.    OB/GYN Status:  No LMP recorded. Patient has had an ablation.  General Assessment Data Location of Assessment: [facility] Assessment Services TTS Assessment: In system Is this a Tele or Face-to-Face Assessment?: Face-to-Face Is this an Initial Assessment or a Re-assessment for this encounter?: Initial Assessment Marital status: Married [place] name: May Is patient pregnant?: No Pregnancy Status: No Living Arrangements: Spouse/significant other, Children Can pt return to current living arrangement?: Yes Admission Status: Voluntary Is patient capable of signing voluntary admission?: Yes Referral Source:  Self/Family/Friend  Medical Screening Exam ([place]) Medical Exam completed: Yes  Crisis Care Plan Living Arrangements: Spouse/significant other, Children Name of Psychiatrist: [facility] Name of Therapist: [facility]; Merrilee Seashore (on-line therapist)  Education Status Is patient currently in school?: No Is the patient employed, unemployed or receiving disability?: Unemployed  Risk to self with the past 6 months Suicidal Ideation: Yes-Currently Present Has patient been a risk to self within the past 6 months prior to admission? : No Suicidal Intent: Yes-Currently Present Has patient had any suicidal intent within the past 6 months prior to admission? : No Is patient at risk for suicide?: Yes Suicidal Plan?: Yes-Currently Present Has patient had any suicidal plan within the past 6 months prior to admission? : No Specify Current Suicidal Plan: slit wrists Access to Means: Yes Specify Access to Suicidal Means: razor Previous Attempts/Gestures: No Intentional Self Injurious Behavior: None Family Suicide History: Unknown Recent stressful life event(s): Other (Comment) Persecutory voices/beliefs?: No Depression: Yes Depression Symptoms: Despondent, Tearfulness, Loss of interest in usual pleasures, Feeling worthless/self pity, Feeling angry/irritable Substance abuse history and/or treatment for substance abuse?: No Suicide prevention information given to non-admitted patients: Not applicable  Risk to Others within the past 6 months Homicidal Ideation: No Does patient have any lifetime risk of violence toward others beyond the six months prior to admission? : No Thoughts of Harm to Others: No Current Homicidal Intent: No Current Homicidal Plan: No Access to Homicidal Means: No History of harm to others?: No Assessment of Violence: None Noted Does patient have access to weapons?: No Criminal Charges Pending?: No Does patient have a court date: No Is patient on  probation?: No  Psychosis Hallucinations: None noted Delusions: None noted  Mental Status Report Appearance/Hygiene: Unremarkable Eye Contact: Good Motor Activity: Unremarkable Speech: Logical/coherent Level of Consciousness: Alert Mood: Depressed Affect: Appropriate to circumstance Anxiety Level: Minimal Thought Processes: Coherent, Relevant Judgement: Partial Orientation: Person, Place, Time, Situation Obsessive Compulsive Thoughts/Behaviors: None  Cognitive Functioning Concentration: Normal Memory: Recent Intact, Remote Intact Is patient IDD: No Is patient DD?: No Insight: Fair Impulse Control: Unable to Assess Appetite: Poor Have you had any weight changes? : No Change Sleep: No Change Vegetative Symptoms: None  ADLScreening [facility] Assessment Services) Patient's cognitive ability adequate to safely complete daily activities?: Yes Patient able to express need for assistance with ADLs?: Yes Independently performs ADLs?: Yes (appropriate for developmental age)  Prior Inpatient Therapy Prior Inpatient Therapy: Yes Prior Therapy Dates: 7/4-04/2018 Prior Therapy Facilty/Provider(s): [facility] Reason for Treatment: depression  Prior Outpatient Therapy Prior Outpatient Therapy: No Does patient have an ACCT team?: No Does patient have Intensive In-House Services?  : No Does patient have [facility] services? : No Does patient have P4CC  services?: No  ADL Screening (condition at time of admission) Patient's cognitive ability adequate to safely complete daily activities?: Yes Is the patient deaf or have difficulty hearing?: No Does the patient have difficulty seeing, even when wearing glasses/contacts?: No Does the patient have difficulty concentrating, remembering, or making decisions?: No Patient able to express need for assistance with ADLs?: Yes Does the patient have difficulty dressing or bathing?: No Independently performs ADLs?: Yes (appropriate for  developmental age) Does the patient have difficulty walking or climbing stairs?: No Weakness of Legs: None Weakness of Arms/Hands: None  Home Assistive Devices/Equipment Home Assistive Devices/Equipment: None    Abuse/Neglect Assessment (Assessment to be complete while patient is alone) Abuse/Neglect Assessment Can Be Completed: Yes Physical Abuse: Denies Verbal Abuse: Denies Sexual Abuse: Denies Exploitation of patient/patient's resources: Denies Self-Neglect: Denies     Regulatory affairs officer (For Healthcare) Does Patient Have a Medical Advance Directive?: No Nutrition Screen- [facility] Adult/WL/AP Patient's home diet: Regular Has the patient recently lost weight without trying?: No Has the patient been eating poorly because of a decreased appetite?: Yes Malnutrition Screening Tool Score: 1  Additional Information 1:1 In Past 12 Months?: No CIRT Risk: No Elopement Risk: No Does patient have medical clearance?: Yes     Disposition:  Disposition Initial Assessment Completed for this Encounter: Yes Disposition of Patient: Admit Type of inpatient treatment program: Adult Patient refused recommended treatment: No Mode of transportation if patient is discharged?: N/A  On Site Evaluation by:   Reviewed with Physician:    Rexene Edison 06/18/2018 1:20 PM

## 2018-06-18 NOTE — Tx Team (Signed)
Admission Note  Pt is a 52 yo female that presents as a walk in after she was writing a letter with a razor blade in her hand and her husband walked in the room. Pt states she has been having worsening depression, anxiety and si over the past two years and none of the medications that she has tried have worked. Pt expresses loneliness and not wanting to be alone since two of her children moved out and went to college. Pt states she was working before her second child was born, and would like to return to doing something, but can't in her current state. Pt pt denies any hx or present sexual/physical/verbal abuse. Pt denies any pain at this time, rating this a 0/10. Pt states a hx of depression in her mother who committed suicide and a son who battles anxiety and depression. Pt states her two daughters are her biggest support. Pt denies any si/hi/ah/vh at this time and verbally agrees to approach staff if these become apparent. Pt denies any tobacco or drug use, with alcohol use being limited to 1-2 drinks once/week.   Consents signed, skin/belongings search completed and patient oriented to unit. Patient stable at this time. Patient given the opportunity to express concerns and ask questions. Patient given toiletries. Will continue to monitor.

## 2018-06-18 NOTE — H&P (Signed)
[place] Screening Exam  Autumn Foster is an 52 y.o. female patient presents to [facility] as a walk in; brought in by her husband with complaints of suicidal ideation and plan to cut wrist.  Patient is unable to contract for safety.  Husband is also concerned for patients safety.   Total Time spent with patient: 30 minutes  Psychiatric Specialty Exam: Physical Exam  Vitals reviewed. Constitutional: She is oriented to person, place, and time. She appears well-developed and well-nourished.  Neck: Normal range of motion.  Respiratory: Effort normal.  Musculoskeletal: Normal range of motion.  Neurological: She is alert and oriented to person, place, and time.  Skin: Skin is warm and dry.  Psychiatric: Her speech is normal and behavior is normal. Her mood appears anxious. Cognition and memory are normal. She expresses impulsivity. She exhibits a depressed mood. She expresses suicidal ideation. She expresses suicidal plans.    Review of Systems  Psychiatric/Behavioral: Positive for depression and suicidal ideas. Negative for hallucinations and memory loss. The patient is nervous/anxious.   All other systems reviewed and are negative.   There were no vitals taken for this visit.There is no height or weight on file to calculate BMI.  General Appearance: Casual and Neat  Eye Contact:  Good  Speech:  Clear and Coherent and Normal Rate  Volume:  Normal  Mood:  Depressed and Hopeless  Affect:  Depressed, Flat and Tearful  Thought Process:  Coherent and Goal Directed  Orientation:  Full (Time, Place, and Person)  Thought Content:  Denies hallucinations, delusions, and paranoia  Suicidal Thoughts:  Yes.  with intent/plan  Homicidal Thoughts:  No  Memory:  Immediate;   Good Recent;   Good Remote;   Good  Judgement:  Impaired  Insight:  Lacking  Psychomotor Activity:  Decreased  Concentration: Concentration: Good and Attention Span: Good  Recall:  Good  Fund of  Knowledge:Good  Language: Good  Akathisia:  No  Handed:  Right  AIMS (if indicated):     Assets:  Communication Skills Desire for Improvement Housing Intimacy Physical Health Social Support Transportation  Sleep:       Musculoskeletal: Strength & Muscle Tone: within normal limits Gait & Station: normal Patient leans: N/A  There were no vitals taken for this visit.  Recommendations:  Inpatient psychiatric treatment  Based on my evaluation the patient does not appear to have an emergency medical condition.  Nahjae Hoeg, NP 06/18/2018, 1:18 PM

## 2018-06-18 NOTE — Tx Team (Signed)
Initial Treatment Plan 06/18/2018 2:45 PM Autumn Foster [id number]    PATIENT STRESSORS: Medication change or noncompliance Occupational concerns   PATIENT STRENGTHS: Ability for insight Average or above average intelligence Communication skills Financial means Supportive family/friends   PATIENT IDENTIFIED PROBLEMS: "Get my joy back and feel content"  "work on my anxiety and coping skills"                   DISCHARGE CRITERIA:  Ability to meet basic life and health needs Adequate post-discharge living arrangements Improved stabilization in mood, thinking, and/or behavior  PRELIMINARY DISCHARGE PLAN: Return to previous living arrangement  PATIENT/FAMILY INVOLVEMENT: This treatment plan has been presented to and reviewed with the patient, Autumn Foster.  The patient and family have been given the opportunity to ask questions and make suggestions.  Baron Sane, RN 06/18/2018, 2:45 PM

## 2018-06-19 DIAGNOSIS — F332 Major depressive disorder, recurrent severe without psychotic features: Principal | ICD-10-CM

## 2018-06-19 LAB — PREGNANCY, URINE: PREG TEST UR: NEGATIVE

## 2018-06-19 LAB — HEMOGLOBIN A1C
HEMOGLOBIN A1C: 4.7 % — AB (ref 4.8–5.6)
Mean Plasma Glucose: 88.19 mg/dL

## 2018-06-19 MED ORDER — DULOXETINE HCL 20 MG PO CPEP: 20.0000 mg | ORAL_CAPSULE | Freq: Every day | ORAL | Status: DC

## 2018-06-19 MED ORDER — LITHIUM CARBONATE 300 MG PO CAPS: 450.0000 mg | ORAL_CAPSULE | Freq: Every day | ORAL | Status: DC

## 2018-06-19 MED ORDER — LORAZEPAM 0.5 MG PO TABS
0.5000 mg | ORAL_TABLET | Freq: Four times a day (QID) | ORAL | Status: DC | PRN
  Administered 2018-06-19 – 2018-06-21 (×3): 0.5 mg via ORAL
  Filled 2018-06-19 (×3): qty 1

## 2018-06-19 MED ORDER — LITHIUM CARBONATE 300 MG PO CAPS
300.0000 mg | ORAL_CAPSULE | Freq: Every day | ORAL | Status: DC
  Administered 2018-06-19: 300 mg via ORAL
  Filled 2018-06-19 (×3): qty 1

## 2018-06-19 MED ORDER — VORTIOXETINE HBR 5 MG PO TABS
5.0000 mg | ORAL_TABLET | Freq: Every day | ORAL | Status: DC
  Administered 2018-06-19 – 2018-06-20 (×2): 5 mg via ORAL
  Filled 2018-06-19 (×4): qty 1

## 2018-06-19 MED ORDER — MIRTAZAPINE 7.5 MG PO TABS
7.5000 mg | ORAL_TABLET | Freq: Every day | ORAL | Status: DC
  Administered 2018-06-19: 7.5 mg via ORAL
  Filled 2018-06-19 (×3): qty 1

## 2018-06-19 NOTE — Progress Notes (Addendum)
D:  Patient denied SI and HI, contracts for safety.  Denied A/V hallucinations. A:  Medications were discussed with MD, nurse.  Patient wants her medication times to be adjusted. R:  Safety maintained with 15 minute checks.  Lab picked up UA this afternoon.

## 2018-06-19 NOTE — Progress Notes (Signed)
D: Pt denies SI/HI/AVH. Pt is pleasant and cooperative. Pt presents very depressed, pt stated her mother killed herself 12 yrs ago. Pt stated since taking her Cymbalta she may think things have been getting worse . Pt stated she wanted to figure out why she has been feeling so depressed , hopeless and helpless for the past 2 yrs.   A: Pt was offered support and encouragement. Pt was given scheduled medications. Pt was encourage to attend groups. Q 15 minute checks were done for safety.   R:Pt attends groups and interacts well with peers and staff. Pt is taking medication. Pt receptive to treatment and safety maintained on unit.

## 2018-06-19 NOTE — BHH Suicide Risk Assessment (Signed)
[facility] Admission Suicide Risk Assessment   Nursing information obtained from:  Patient Demographic factors:  Unemployed, Caucasian Current Mental Status:  Suicidal ideation indicated by patient, Suicide plan, Self-harm thoughts, Intention to act on suicide plan Loss Factors:  NA Historical Factors:  Prior suicide attempts, Family history of mental illness or substance abuse, Impulsivity, Family history of suicide Risk Reduction Factors:  Sense of responsibility to family, Positive social support, Positive coping skills or problem solving skills, Responsible for children under 87 years of age, Religious beliefs about death, Living with another person, especially a relative  Total Time spent with patient: 45 minutes  Principal Problem:  MDD Diagnosis:   Patient Active Problem List   Diagnosis Date Noted  . MDD (major depressive disorder), recurrent severe, without psychosis ([place]) [F33.2] 06/18/2018  . Dizziness [R42] 04/05/2016  . Orthostatic hypotension [I95.1] 04/05/2016  . POTS (postural orthostatic tachycardia syndrome) [R00.0, I95.1] 04/05/2016  . Chest pain [R07.9] 06/20/2014  . Palpitations [R00.2] 06/20/2014  . PVC (premature ventricular contraction) [I49.3] 06/20/2014  . Abdominal pain [R10.9] 11/07/2012  . Anxiety [F41.9] 11/07/2012   Subjective Data:   Continued Clinical Symptoms:  Alcohol Use Disorder Identification Test Final Score (AUDIT): 2 The "Alcohol Use Disorders Identification Test", Guidelines for Use in Primary Care, Second Edition.  World Pharmacologist [facility]). Score between 0-7:  no or low risk or alcohol related problems. Score between 8-15:  moderate risk of alcohol related problems. Score between 16-19:  high risk of alcohol related problems. Score 20 or above:  warrants further diagnostic evaluation for alcohol dependence and treatment.   CLINICAL FACTORS:  52 year old married female, three children. Reports history of chronic /worsening depression and  significant anxiety/worry. Presented to the hospital due to worsening depression, suicidal ideations, with thoughts of cutting wrists . Reports history of multiple antidepressant medication trials in the past.    Psychiatric Specialty Exam: Physical Exam  ROS  Blood pressure 119/79, pulse (!) 57, temperature 98.4 F (36.9 C), temperature source Oral, resp. rate 16, height 6' (1.829 m), weight 64.4 kg, SpO2 100 %.Body mass index is 19.26 kg/m.  See admit note MSE                                                        COGNITIVE FEATURES THAT CONTRIBUTE TO RISK:  Closed-mindedness and Loss of executive function    SUICIDE RISK:   Moderate:  Frequent suicidal ideation with limited intensity, and duration, some specificity in terms of plans, no associated intent, good self-control, limited dysphoria/symptomatology, some risk factors present, and identifiable protective factors, including available and accessible social support.  PLAN OF CARE: Patient will be admitted to inpatient psychiatric unit for stabilization and safety. Will provide and encourage milieu participation. Provide medication management and maked adjustments as needed.  Will follow daily.    I certify that inpatient services furnished can reasonably be expected to improve the patient's condition.   Jenne Campus, MD 06/19/2018, 2:48 PM

## 2018-06-19 NOTE — Progress Notes (Signed)
D: Patient denies SI, HI or AVH this evening. Patient presents as flat and depressed but calm and cooperative while visiting with her husband.  Pt. States she had an episode of low blood pressure in the morning, patients vital signs stable throughout the day.  I gave her a pitcher with gatorade/water and encouraged patient to push fluids.  Pt. States, "this is not a good place to get well", states that she feels like there is no "therapy" but is visualized in the dayroom interacting with others and attending groups.  Pt. States talks about being depressed x 2 years and is, "ready to feel better".  Pt. Denies any physical complaints.  A: Patient given emotional support from RN. Patient encouraged to come to staff with concerns and/or questions. Patient's medication routine continued. Patient's orders and plan of care reviewed.   R: Patient remains appropriate and cooperative. Will continue to monitor patient q15 minutes for safety.

## 2018-06-19 NOTE — Plan of Care (Signed)
   Problem: Physical Regulation: Goal: Ability to maintain clinical measurements within normal limits will improve Outcome: Progressing   Problem: Safety: Goal: Periods of time without injury will increase Outcome: Progressing   Problem: Education: Goal: Ability to state activities that reduce stress will improve Outcome: Progressing

## 2018-06-19 NOTE — H&P (Addendum)
Psychiatric Admission Assessment Adult  Patient Identification: Autumn Foster MRN:  [id number] Date of Evaluation:  06/19/2018 Chief Complaint:  Depression, anxiety Principal Diagnosis:  MDD, no psychotic features  Diagnosis:   Patient Active Problem List   Diagnosis Date Noted  . MDD (major depressive disorder), recurrent severe, without psychosis ([place]) [F33.2] 06/18/2018  . Dizziness [R42] 04/05/2016  . Orthostatic hypotension [I95.1] 04/05/2016  . POTS (postural orthostatic tachycardia syndrome) [R00.0, I95.1] 04/05/2016  . Chest pain [R07.9] 06/20/2014  . Palpitations [R00.2] 06/20/2014  . PVC (premature ventricular contraction) [I49.3] 06/20/2014  . Abdominal pain [R10.9] 11/07/2012  . Anxiety [F41.9] 11/07/2012   History of Present Illness: 52 year old married female who  presented to the hospital with her husband. Reports history of chronic depression and anxiety which started in 2017. States she had some prior depressive episodes before then but they were generally mild . She has been experiencing suicidal ideations and recently has had thoughts of cutting wrists or hanging herself. States that yesterday she did hold a razor but did not actually cut self. Told her husband, who encouraged her to come to hospital.  States her depression is chronic , and she feels she is no longer able to enjoy activities or function at her prior level of functioning due to her depression. States she has been feeling worse  over the last several weeks. She recently had gone to a  a residential program in [place], but stayed only 2-3 days because she did not feel it was a good fit for her, as it was more focused on addiction issues, which she denies having .Of note, states that while she was there Cymbalta was titrated from 30 mgrs  up to 60 mgrs QDAY , but feels this titration has not been helping and has been causing poor appetite.  Endorses neuro-vegetative symptoms as below. Admission labs reviewed-  lithium level is subtherapeutic at 0.08  Associated Signs/Symptoms: Depression Symptoms:  depressed mood, anhedonia, insomnia, suicidal thoughts with specific plan, anxiety, loss of energy/fatigue, decreased appetite, reports she has lost 5-10 lbs over the last month (Hypo) Manic Symptoms:  None noted or endorsed  Anxiety Symptoms:  Reports anxiety, which she describes as tendency to worry excessively and frequent panic symptoms, denies agoraphobia Psychotic Symptoms:  Denies  PTSD Symptoms: Denies  Total Time spent with patient: 45 minutes  Past Psychiatric History: reports one prior psychiatric admission for depression and suicidal ideation. States she has never attempted suicide, denies history of self cutting, denies history of psychosis. Denies history of violence. Denies history of mania or hypomania. Describes history of anxiety, describes panic symptoms, excessive worrying, denies agoraphobia. Denies history of psychosis. Denies history of anxorexia nervosa or eating disorder   Is the patient at risk to self? Yes.    Has the patient been a risk to self in the past 6 months? Yes.    Has the patient been a risk to self within the distant past? No.  Is the patient a risk to others? No.  Has the patient been a risk to others in the past 6 months? No.  Has the patient been a risk to others within the distant past? No.   Prior Inpatient Therapy: Prior Inpatient Therapy: Yes Prior Therapy Dates: 7/4-04/2018 Prior Therapy Facilty/Provider(s): [facility] Reason for Treatment: depression Prior Outpatient Therapy: Prior Outpatient Therapy: No Does patient have an ACCT team?: No Does patient have Intensive In-House Services?  : No Does patient have [facility] services? : No Does patient  have P4CC services?: No  Alcohol Screening: 1. How often do you have a drink containing alcohol?: 2 to 4 times a month 2. How many drinks containing alcohol do you have on a typical day when you  are drinking?: 1 or 2 3. How often do you have six or more drinks on one occasion?: Never AUDIT-C Score: 2 4. How often during the last year have you found that you were not able to stop drinking once you had started?: Never 5. How often during the last year have you failed to do what was normally expected from you becasue of drinking?: Never 6. How often during the last year have you needed a first drink in the morning to get yourself going after a heavy drinking session?: Never 7. How often during the last year have you had a feeling of guilt of remorse after drinking?: Never 8. How often during the last year have you been unable to remember what happened the night before because you had been drinking?: Never 9. Have you or someone else been injured as a result of your drinking?: No 10. Has a relative or friend or a doctor or another health worker been concerned about your drinking or suggested you cut down?: No Alcohol Use Disorder Identification Test Final Score (AUDIT): 2 Substance Abuse History in the last 12 months:  Denies alcohol or drug abuse  Consequences of Substance Abuse: Denies  Previous Psychotropic Medications: Cymbalta x 2 months, recently increased from 30 mgrs to 60 mgrs, Lithium Carbonate ( 300 mgrs QDAY) , states she has been on this medication for several months, states higher doses have caused side effects. Ativan 0.5 mgrs Q 8 hours PRN, states she takes one per day, denies abusing,  Cytomel 5 micrograms QDAY  States she has had several different medication trials in the past - Pristiq, Wellbutrin, Prozac, Zoloft, Remeron, Viibryd, Abilify, Latuda . Of note, denies Prozac, Lexapro , Pristiq allergies, but describes side effects. Reports she has had akathisia symptoms with antipsychotic medications States she has had ECT - # 4 , last September, and has had TMCs - reports she does not feel these helped significantly . Psychological Evaluations:  No  Past Medical History:  states she was diagnosed with Lyme's Disease 3 years ago.  States she has been on low dose Synthroid x several months as it was felt she had some symptoms of hypothyroidism, but that her thyroid function was normal. Past Medical History:  Diagnosis Date  . Anemia   . Anxiety   . ASCUS on Pap smear   . Bacterial infection   . Bladder spasm   . Blood in stool   . Cancer ([place])    Basal Cell Skin Cancer  . Candida vaginitis   . Cervical dysplasia   . Colon polyp    tubular adenoma  . Depression   . Dizziness 04/05/2016  . Dysmenorrhea   . Endometrial polyp   . Fatigue   . Genital warts   . Hemorrhoids   . Herpes   . History of chicken pox   . HPV (human papilloma virus) infection   . Low blood pressure   . Lyme disease   . Menorrhagia   . Orthostatic hypotension 04/05/2016  . Osteopenia   . Ovarian cyst   . PMDD (premenstrual dysphoric disorder)   . POTS (postural orthostatic tachycardia syndrome) 04/05/2016  . Rectal bleed   . Urinary frequency   . Urine incontinence   . Yeast infection  Past Surgical History:  Procedure Laterality Date  . COLONOSCOPY  12/29/2010   2 sessile polyps Dr. Fuller Plan  . DILATION AND CURETTAGE OF UTERUS    . HYSTEROSCOPY    . NOVASURE ABLATION     Family History: father alive, mother committed suicide 12 years ago. Has two sisters . Family History  Problem Relation Age of Onset  . Prostate cancer Father   . Hypertension Father   . Renal cancer Father   . Migraines Mother   . Alcohol abuse Mother   . Suicidality Mother        deceased  . Colon cancer Paternal Grandfather   . Dementia Paternal Grandmother   . Thyroid disease Maternal Grandmother   . Rheum arthritis Maternal Grandmother   . Diabetes Maternal Grandfather   . Anxiety disorder Sister        x 2   Family Psychiatric  History: mother had history of depression, alcohol use disorder and committed suicide 12 years ago. Son has history of anxiety Tobacco Screening:  does not  smoke or use tobacco products  Social History: 74, married, has three children ( 23,20,15). Oldest and youngest children are living at home. Currently unemployed.  Social History   Substance and Sexual Activity  Alcohol Use Yes  . Alcohol/week: 0.0 standard drinks   Comment: 1-2 drinks/week     Social History   Substance and Sexual Activity  Drug Use No    Additional Social History: Marital status: Married    Pain Medications: none noted Prescriptions: Cymbalta '60mg'$ ; Lithium 300 mg; Valtrex 500 mg; Synthroid 25 mg; Cytomel '5mg'$ ; Progesterone '300mg'$  Over the Counter: none noted History of alcohol / drug use?: No history of alcohol / drug abuse  Allergies:   Allergies  Allergen Reactions  . Acyclovir And Related   . Doxycycline     Fatigue  . Lexapro [Escitalopram Oxalate] Other (See Comments)    Insomnia   . Pristiq [Desvenlafaxine]     Dry heaves/dizzy  . Prozac [Fluoxetine Hcl]     HA and Strange Dreams  . Septra Ds [Sulfamethoxazole-Trimethoprim]     Jitteriness/hyper/affecting sleep  . Penicillins Rash    Childhood rash   Lab Results:  Results for orders placed or performed during the hospital encounter of 06/18/18 (from the past 48 hour(s))  CBC     Status: None   Collection Time: 06/18/18  6:21 PM  Result Value Ref Range   WBC 7.1 4.0 - 10.5 K/uL   RBC 4.33 3.87 - 5.11 MIL/uL   Hemoglobin 13.6 12.0 - 15.0 g/dL   HCT 40.6 36.0 - 46.0 %   MCV 93.8 78.0 - 100.0 fL   MCH 31.4 26.0 - 34.0 pg   MCHC 33.5 30.0 - 36.0 g/dL   RDW 12.6 11.5 - 15.5 %   Platelets 309 150 - 400 K/uL    Comment: Performed at [facility], [place] [address] [place], [place] [address]  Comprehensive metabolic panel     Status: Abnormal   Collection Time: 06/18/18  6:21 PM  Result Value Ref Range   Sodium 138 135 - 145 mmol/L   Potassium 4.2 3.5 - 5.1 mmol/L   Chloride 104 98 - 111 mmol/L   CO2 24 22 - 32 mmol/L   Glucose, Bld 134 (H) 70 - 99 mg/dL   BUN 17 6 - 20  mg/dL   Creatinine, Ser 0.68 0.44 - 1.00 mg/dL   Calcium 9.7 8.9 - 10.3 mg/dL   Total  Protein 7.1 6.5 - 8.1 g/dL   Albumin 4.6 3.5 - 5.0 g/dL   AST 20 15 - 41 U/L   ALT 16 0 - 44 U/L   Alkaline Phosphatase 40 38 - 126 U/L   Total Bilirubin 0.6 0.3 - 1.2 mg/dL   GFR calc non Af Amer >60 >60 mL/min   GFR calc Af Amer >60 >60 mL/min    Comment: (NOTE) The eGFR has been calculated using the CKD EPI equation. This calculation has not been validated in all clinical situations. eGFR's persistently <60 mL/min signify possible Chronic Kidney Disease.    Anion gap 10 5 - 15    Comment: Performed at [facility], [place] [address], [place], [place] [address]  Hemoglobin A1c     Status: Abnormal   Collection Time: 06/18/18  6:21 PM  Result Value Ref Range   Hgb A1c MFr Bld 4.7 (L) 4.8 - 5.6 %    Comment: (NOTE) Pre diabetes:          5.7%-6.4% Diabetes:              >6.4% Glycemic control for   <7.0% adults with diabetes    Mean Plasma Glucose 88.19 mg/dL    Comment: Performed at [facility] [address] [place], [place] [address]  Lipid panel     Status: Abnormal   Collection Time: 06/18/18  6:21 PM  Result Value Ref Range   Cholesterol 226 (H) 0 - 200 mg/dL   Triglycerides 41 <150 mg/dL   HDL 75 >40 mg/dL   Total CHOL/HDL Ratio 3.0 RATIO   VLDL 8 0 - 40 mg/dL   LDL Cholesterol 143 (H) 0 - 99 mg/dL    Comment:        Total Cholesterol/HDL:CHD Risk Coronary Heart Disease Risk Table                     Men   Women  1/2 Average Risk   3.4   3.3  Average Risk       5.0   4.4  2 X Average Risk   9.6   7.1  3 X Average Risk  23.4   11.0        Use the calculated Patient Ratio above and the CHD Risk Table to determine the patient's CHD Risk.        ATP III CLASSIFICATION (LDL):  <100     mg/dL   Optimal  100-129  mg/dL   Near or Above                    Optimal  130-159  mg/dL   Borderline  160-189  mg/dL   High  >190     mg/dL   Very  High Performed at [facility] [address], [place], [place] [address]   Ethanol     Status: None   Collection Time: 06/18/18  6:21 PM  Result Value Ref Range   Alcohol, Ethyl (B) <10 <10 mg/dL    Comment: (NOTE) Lowest detectable limit for serum alcohol is 10 mg/dL. For medical purposes only. Performed at [facility], [place] [address], [place], [place] [address]   Lithium level     Status: Abnormal   Collection Time: 06/18/18  6:29 PM  Result Value Ref Range   Lithium Lvl 0.08 (L) 0.60 - 1.20 mmol/L    Comment: Performed at [facility], [address] [place], [place] [address]    Blood Alcohol level:  Lab Results  Component Value Date   ETH <10 42/59/5638    Metabolic Disorder Labs:  Lab Results  Component Value Date   HGBA1C 4.7 (L) 06/18/2018   MPG 88.19 06/18/2018   No results found for: PROLACTIN Lab Results  Component Value Date   CHOL 226 (H) 06/18/2018   TRIG 41 06/18/2018   HDL 75 06/18/2018   CHOLHDL 3.0 06/18/2018   VLDL 8 06/18/2018   LDLCALC 143 (H) 06/18/2018   LDLCALC 98 11/07/2012    Current Medications: Current Facility-Administered Medications  Medication Dose Route Frequency Provider Last Rate Last Dose  . DULoxetine (CYMBALTA) DR capsule 60 mg  60 mg Oral Daily Rankin, Shuvon B, NP      . hydrOXYzine (ATARAX/VISTARIL) tablet 25 mg  25 mg Oral Q6H PRN Patriciaann Clan E, PA-C   25 mg at 06/18/18 2257  . levothyroxine (SYNTHROID, LEVOTHROID) tablet 25 mcg  25 mcg Oral QAC breakfast Rankin, Shuvon B, NP   25 mcg at 06/19/18 0827  . liothyronine (CYTOMEL) tablet 5 mcg  5 mcg Oral Daily Rankin, Shuvon B, NP   5 mcg at 06/19/18 0827  . lithium carbonate capsule 300 mg  300 mg Oral Q supper Rankin, Shuvon B, NP   300 mg at 06/18/18 2148  . progesterone (PROMETRIUM) capsule 200 mg  200 mg Oral Daily Rankin, Shuvon B, NP   Stopped at 06/18/18 1458   Facility-Administered Medications Ordered  in Other Encounters  Medication Dose Route Frequency Provider Last Rate Last Dose  . gadopentetate dimeglumine (MAGNEVIST) injection 15 mL  15 mL Intravenous Once PRN [name], MD       PTA Medications: Medications Prior to Admission  Medication Sig Dispense Refill Last Dose  . DULoxetine (CYMBALTA) 60 MG capsule Take 60 mg by mouth daily.   Past Week at Unknown time  . lithium carbonate 300 MG capsule Take 300 mg by mouth at bedtime.   Past Week at Unknown time  . progesterone (PROMETRIUM) 100 MG capsule Take 300 mg by mouth daily.   Past Week at Unknown time  . valACYclovir (VALTREX) 500 MG tablet Take 500 mg by mouth daily.   Past Week at Unknown time  . LORazepam (ATIVAN) 0.5 MG tablet Take 0.5 mg by mouth every 8 (eight) hours as needed.    Past Month at Unknown time  . MAGNESIUM PO Take 1 tablet by mouth daily. 400-800 MG as directed Reported on 04/05/2016   07/17/2017 at Unknown time    Musculoskeletal: Strength & Muscle Tone: within normal limits Gait & Station: normal Patient leans: N/A  Psychiatric Specialty Exam: Physical Exam  Review of Systems  Constitutional: Positive for weight loss.  HENT: Negative.   Eyes: Negative.   Respiratory: Negative.   Cardiovascular: Negative.   Gastrointestinal: Positive for nausea.  Genitourinary: Negative.   Musculoskeletal: Negative.   Skin: Negative.   Neurological: Negative for seizures.  Endo/Heme/Allergies: Negative.   Psychiatric/Behavioral: Positive for depression. The patient is nervous/anxious.   All other systems reviewed and are negative.   Blood pressure 102/73, pulse 69, temperature 98.4 F (36.9 C), temperature source Oral, resp. rate 16, height 6' (1.829 m), weight 64.4 kg, SpO2 100 %.Body mass index is 19.26 kg/m.  General Appearance: Well Groomed  Eye Contact:  Good  Speech:  Normal Rate  Volume:  Normal  Mood:  Anxious and Depressed  Affect:  constricted , anxious  Thought  Process:  Linear and Descriptions  of Associations: Intact  Orientation:  Full (Time, Place, and Person)  Thought Content:  no hallucinations, no delusions, not internally preoccupied   Suicidal Thoughts:  No denies current suicidal or self injurious ideations, denies any homicidal or violent ideations. Contracts for safety on unit .  Homicidal Thoughts:  No  Memory:  recent and remote grossly intact   Judgement:  Fair  Insight:  Fair  Psychomotor Activity:  Decreased  Concentration:  Concentration: Good and Attention Span: Good  Recall:  Good  Fund of Knowledge:  Good  Language:  Good  Akathisia:  Negative  Handed:  Right  AIMS (if indicated):     Assets:  Communication Skills Resilience  ADL's:  Intact  Cognition:  WNL  Sleep:       Treatment Plan Summary: Daily contact with patient to assess and evaluate symptoms and progress in treatment, Medication management, Plan inpatient treatment and medications as below  Observation Level/Precautions:  15 minute checks  Laboratory:  As needed - TSH  Psychotherapy:  [facility], group therapy  Medications:  We discussed medication options at length. Patient describes having been on multiple psychiatric medications in the past with limited improvement . Regarding current medications, she states Lithium at higher doses has caused side effects, Cymbalta is not helping and recent titration to 60 mgrs poorly tolerated. Prefers to discontinue this medication.   She is expressing interest in Trintellix , which she has not been on before , and agrees to Remeron, which may help depression and also help address poor sleep and decreased appetite . Agrees to gradual titration of Lithium as an augmentation strategy. Will start medications at lower doses , consider slower titration, based on reported history of poor tolerance to several prior antidepressant trials. Start Remeron 7.5 mgrs QHS Start Trintellix 5 mgrs QDAY  For now continue Lithium 300 mgrs QDAY for augmentation, consider  gradual titration Continue Synthroid 25 micrograms QDAY for augmentation Ativan 0.5 mgrs Q 6 hours PRN for anxiety as needed   Consultations:  As needed   Discharge Concerns: -   Estimated LOS: 5 days   Other:     Physician Treatment Plan for Primary Diagnosis: MDD, no psychotic features  Long Term Goal(s): Improvement in symptoms so as ready for discharge  Short Term Goals: Ability to identify changes in lifestyle to reduce recurrence of condition will improve and Ability to maintain clinical measurements within normal limits will improve  Physician Treatment Plan for Secondary Diagnosis: Active Problems:   MDD (major depressive disorder), recurrent severe, without psychosis ([place])  Long Term Goal(s): Improvement in symptoms so as ready for discharge  Short Term Goals: Ability to identify changes in lifestyle to reduce recurrence of condition will improve, Ability to verbalize feelings will improve, Ability to disclose and discuss suicidal ideas, Ability to demonstrate self-control will improve, Ability to identify and develop effective coping behaviors will improve and Ability to maintain clinical measurements within normal limits will improve  I certify that inpatient services furnished can reasonably be expected to improve the patient's condition.    Jenne Campus, MD 8/26/20199:33 AM

## 2018-06-19 NOTE — Progress Notes (Signed)
Nutrition Brief Note  Patient identified on the Malnutrition Screening Tool (MST) Report  Weight is stable.   Wt Readings from Last 15 Encounters:  06/18/18 64.4 kg  07/18/17 65.3 kg  07/15/17 65.3 kg  07/13/17 65.8 kg  07/11/17 64.9 kg  07/05/17 64.4 kg  06/30/17 64.9 kg  06/22/17 64.4 kg  06/20/17 64.8 kg  01/07/17 65.8 kg  12/24/16 65.8 kg  11/03/16 66.7 kg  06/08/16 67.1 kg  04/05/16 69.1 kg  03/31/16 68.5 kg    Body mass index is 19.26 kg/m. Patient meets criteria for normal based on current BMI.   Labs and medications reviewed.   No nutrition interventions warranted at this time. If nutrition issues arise, please consult RD.   Clayton Bibles, MS, RD, [facility] Dietitian Pager: [phone] After Hours Pager: [phone]

## 2018-06-19 NOTE — Progress Notes (Addendum)
Patient stated she wants to take lithium at bedtime because it makes her go to sleep.  Progestron and valtrex 500 mg at bedtime also.  Then patient stated she can take valtrex any time of the day.  She thinks that is all the changes she want to make.    Then patient stated trintelix in the morning.  Also synthroid and vitamin in the morning.  Synthroid and Tytomel in the morning 30 minutes before food.  Patient stated ativan did not help her very much, only "alittle bit".

## 2018-06-19 NOTE — Tx Team (Signed)
Interdisciplinary Treatment and Diagnostic Plan Update  06/19/2018 Time of Session: [name]: [id number]  Principal Diagnosis: <principal problem not specified>  Secondary Diagnoses: Active Problems:   MDD (major depressive disorder), recurrent severe, without psychosis ([name])   Current Medications:  Current Facility-Administered Medications  Medication Dose Route Frequency Provider Last Rate Last Dose  . levothyroxine (SYNTHROID, LEVOTHROID) tablet 25 mcg  25 mcg Oral QAC breakfast Rankin, Shuvon B, NP   25 mcg at 06/19/18 0827  . lithium carbonate capsule 300 mg  300 mg Oral Q supper Cobos, Myer Peer, MD      . LORazepam (ATIVAN) tablet 0.5 mg  0.5 mg Oral Q6H PRN Cobos, Myer Peer, MD      . mirtazapine (REMERON) tablet 7.5 mg  7.5 mg Oral QHS Cobos, Myer Peer, MD      . progesterone (PROMETRIUM) capsule 200 mg  200 mg Oral Daily Rankin, Shuvon B, NP   Stopped at 06/18/18 1458  . vortioxetine HBr (TRINTELLIX) tablet 5 mg  5 mg Oral Daily Cobos, Myer Peer, MD   5 mg at 06/19/18 1259   Facility-Administered Medications Ordered in Other Encounters  Medication Dose Route Frequency Provider Last Rate Last Dose  . gadopentetate dimeglumine (MAGNEVIST) injection 15 mL  15 mL Intravenous Once PRN [name], MD       PTA Medications: Medications Prior to Admission  Medication Sig Dispense Refill Last Dose  . DULoxetine (CYMBALTA) 60 MG capsule Take 60 mg by mouth daily.   Past Week at Unknown time  . lithium carbonate 300 MG capsule Take 300 mg by mouth at bedtime.   Past Week at Unknown time  . progesterone (PROMETRIUM) 100 MG capsule Take 300 mg by mouth daily.   Past Week at Unknown time  . valACYclovir (VALTREX) 500 MG tablet Take 500 mg by mouth daily.   Past Week at Unknown time  . LORazepam (ATIVAN) 0.5 MG tablet Take 0.5 mg by mouth every 8 (eight) hours as needed.    Past Month at Unknown time  . MAGNESIUM PO Take 1 tablet by mouth daily. 400-800 MG as directed  Reported on 04/05/2016   07/17/2017 at Unknown time    Patient Stressors: Medication change or noncompliance Occupational concerns  Patient Strengths: Ability for insight Average or above average intelligence Child psychotherapist Supportive family/friends  Treatment Modalities: Medication Management, Group therapy, Case management,  1 to 1 session with clinician, Psychoeducation, Recreational therapy.   Physician Treatment Plan for Primary Diagnosis: <principal problem not specified> Long Term Goal(s): Improvement in symptoms so as ready for discharge Improvement in symptoms so as ready for discharge   Short Term Goals: Ability to identify changes in lifestyle to reduce recurrence of condition will improve Ability to maintain clinical measurements within normal limits will improve Ability to identify changes in lifestyle to reduce recurrence of condition will improve Ability to verbalize feelings will improve Ability to disclose and discuss suicidal ideas Ability to demonstrate self-control will improve Ability to identify and develop effective coping behaviors will improve Ability to maintain clinical measurements within normal limits will improve  Medication Management: Evaluate patient's response, side effects, and tolerance of medication regimen.  Therapeutic Interventions: 1 to 1 sessions, Unit Group sessions and Medication administration.  Evaluation of Outcomes: Not Met  Physician Treatment Plan for Secondary Diagnosis: Active Problems:   MDD (major depressive disorder), recurrent severe, without psychosis ([name])  Long Term Goal(s): Improvement in symptoms so as ready for discharge Improvement in  symptoms so as ready for discharge   Short Term Goals: Ability to identify changes in lifestyle to reduce recurrence of condition will improve Ability to maintain clinical measurements within normal limits will improve Ability to identify changes in lifestyle to  reduce recurrence of condition will improve Ability to verbalize feelings will improve Ability to disclose and discuss suicidal ideas Ability to demonstrate self-control will improve Ability to identify and develop effective coping behaviors will improve Ability to maintain clinical measurements within normal limits will improve     Medication Management: Evaluate patient's response, side effects, and tolerance of medication regimen.  Therapeutic Interventions: 1 to 1 sessions, Unit Group sessions and Medication administration.  Evaluation of Outcomes: Not Met   RN Treatment Plan for Primary Diagnosis: <principal problem not specified> Long Term Goal(s): Knowledge of disease and therapeutic regimen to maintain health will improve  Short Term Goals: Ability to identify and develop effective coping behaviors will improve and Compliance with prescribed medications will improve  Medication Management: RN will administer medications as ordered by provider, will assess and evaluate patient's response and provide education to patient for prescribed medication. RN will report any adverse and/or side effects to prescribing provider.  Therapeutic Interventions: 1 on 1 counseling sessions, Psychoeducation, Medication administration, Evaluate responses to treatment, Monitor vital signs and CBGs as ordered, Perform/monitor CIWA, COWS, AIMS and Fall Risk screenings as ordered, Perform wound care treatments as ordered.  Evaluation of Outcomes: Not Met   LCSW Treatment Plan for Primary Diagnosis: <principal problem not specified> Long Term Goal(s): Safe transition to appropriate next level of care at discharge, Engage patient in therapeutic group addressing interpersonal concerns.  Short Term Goals: Engage patient in aftercare planning with referrals and resources, Increase social support and Increase skills for wellness and recovery  Therapeutic Interventions: Assess for all discharge needs, 1 to 1  time with Social worker, Explore available resources and support systems, Assess for adequacy in community support network, Educate family and significant other(s) on suicide prevention, Complete Psychosocial Assessment, Interpersonal group therapy.  Evaluation of Outcomes: Not Met   Progress in Treatment: Attending groups: Yes. Participating in groups: Yes. Taking medication as prescribed: Yes. Toleration medication: Yes. Family/Significant other contact made: No, will contact:  husband Patient understands diagnosis: Yes. Discussing patient identified problems/goals with staff: Yes. Medical problems stabilized or resolved: Yes. Denies suicidal/homicidal ideation: Yes. Issues/concerns per patient self-inventory: No. Other: none  New problem(s) identified: No, Describe:  none  New Short Term/Long Term Goal(s):  Patient Goals:  "Feel some sort of hope.  Get meds right."  Discharge Plan or Barriers:   Reason for Continuation of Hospitalization: Depression Medication stabilization  Estimated Length of Stay: 3-5 days.  Attendees: Patient:Autumn Foster 06/19/2018   Physician: Dr. Parke Poisson, MD 06/19/2018   Nursing: Eben Burow, RN 06/19/2018   RN Care Manager: 06/19/2018   Social Worker: Lurline Idol, LCSW 06/19/2018   Recreational Therapist:  06/19/2018   Other:  06/19/2018   Other:  06/19/2018   Other: 06/19/2018        Scribe for Treatment Team: Joanne Chars, LCSW 06/19/2018 3:16 PM

## 2018-06-19 NOTE — Plan of Care (Signed)
Nurse discussed anxiety, depression, coping skills with patient. 

## 2018-06-19 NOTE — Progress Notes (Signed)
Pt attend wrap up group. Her day was a 2. The one good thing that happened to her today she came to the hospital. She meet new people, she want to ge t her medication correct to feel better.
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
## 2018-06-19 NOTE — Progress Notes (Signed)
Recreation Therapy Notes  Date: 8.26.19 Time: 0930 Location: 300 [address] Dayroom  Group Topic: Stress Management  Goal Area(s) Addresses:  Patient will verbalize importance of using healthy stress management.  Patient will identify positive emotions associated with healthy stress management.   Intervention: Stress Management  Activity :  Guided Imagery.  LRT introduced the stress management technique of guided imagery.  LRT played a meditation on the computer for patients to engage in the activity.  Patients were to follow along as the meditation played.  Education:  Stress Management, Discharge Planning.   Education Outcome: Acknowledges edcuation/In group clarification offered/Needs additional education  Clinical Observations/Feedback: Pt did not attend group.     Victorino Sparrow, LRT/CTRS         Victorino Sparrow A 06/19/2018 12:19 PM

## 2018-06-19 NOTE — Progress Notes (Signed)
Pt was up at the medication window and she became diaphoretic and dizzy . pt was placed in a chair and Bp was 72/61 , O2 98 % RA , P- 62 . Pt was given Gatorade and pt Bp 97/74 after 5 min. Pt  Was wheeled to her room and will continue to monitor. Pt stated that her Cymbalta was changed from 30 mg to 60 mg on Saturday. Pt stated she is very sensitive to medication.

## 2018-06-19 NOTE — BHH Group Notes (Signed)
[facility] LCSW Group Therapy Note  Date/Time: 06/19/18, 1315  Type of Therapy and Topic:  Group Therapy:  Overcoming Obstacles  Participation Level:  active  Description of Group:    In this group patients will be encouraged to explore what they see as obstacles to their own wellness and recovery. They will be guided to discuss their thoughts, feelings, and behaviors related to these obstacles. The group will process together ways to cope with barriers, with attention given to specific choices patients can make. Each patient will be challenged to identify changes they are motivated to make in order to overcome their obstacles. This group will be process-oriented, with patients participating in exploration of their own experiences as well as giving and receiving support and challenge from other group members.  Therapeutic Goals: 1. Patient will identify personal and current obstacles as they relate to admission. 2. Patient will identify barriers that currently interfere with their wellness or overcoming obstacles.  3. Patient will identify feelings, thought process and behaviors related to these barriers. 4. Patient will identify two changes they are willing to make to overcome these obstacles:    Summary of Patient Progress: Pt shared that hopelessness is her biggest obstacle.  Pt very open in group about her inability to find anything that has improved her depression and her thoughts of suicide because life is so hard right now.  The group provided feedback and pt interacted well with the group.      Therapeutic Modalities:   Cognitive Behavioral Therapy Solution Focused Therapy Motivational Interviewing Relapse Prevention Therapy  Lurline Idol, LCSW

## 2018-06-20 DIAGNOSIS — Z818 Family history of other mental and behavioral disorders: Secondary | ICD-10-CM

## 2018-06-20 DIAGNOSIS — Z811 Family history of alcohol abuse and dependence: Secondary | ICD-10-CM

## 2018-06-20 DIAGNOSIS — R45851 Suicidal ideations: Secondary | ICD-10-CM

## 2018-06-20 DIAGNOSIS — Z79899 Other long term (current) drug therapy: Secondary | ICD-10-CM

## 2018-06-20 MED ORDER — ONDANSETRON HCL 4 MG PO TABS: 4.0000 mg | ORAL_TABLET | Freq: Three times a day (TID) | ORAL | Status: DC | PRN

## 2018-06-20 MED ORDER — MIRTAZAPINE 15 MG PO TABS
15.0000 mg | ORAL_TABLET | Freq: Every day | ORAL | Status: DC
  Administered 2018-06-20: 15 mg via ORAL
  Filled 2018-06-20 (×4): qty 1

## 2018-06-20 MED ORDER — VORTIOXETINE HBR 10 MG PO TABS
10.0000 mg | ORAL_TABLET | Freq: Every day | ORAL | Status: DC
  Administered 2018-06-21 – 2018-06-22 (×2): 10 mg via ORAL
  Filled 2018-06-20 (×5): qty 1

## 2018-06-20 MED ORDER — LIOTHYRONINE SODIUM 5 MCG PO TABS
5.0000 ug | ORAL_TABLET | Freq: Every day | ORAL | Status: DC
  Administered 2018-06-20 – 2018-06-22 (×3): 5 ug via ORAL
  Filled 2018-06-20 (×5): qty 1

## 2018-06-20 MED ORDER — POLYETHYLENE GLYCOL 3350 17 G PO PACK
17.0000 g | PACK | Freq: Every day | ORAL | Status: DC
  Administered 2018-06-20 – 2018-06-21 (×2): 17 g via ORAL
  Filled 2018-06-20 (×6): qty 1

## 2018-06-20 MED ORDER — VALACYCLOVIR HCL 500 MG PO TABS
500.0000 mg | ORAL_TABLET | Freq: Every day | ORAL | Status: DC
  Administered 2018-06-20 – 2018-06-22 (×3): 500 mg via ORAL
  Filled 2018-06-20 (×5): qty 1

## 2018-06-20 MED ORDER — DOXYCYCLINE HYCLATE 100 MG PO TABS
200.0000 mg | ORAL_TABLET | Freq: Once | ORAL | Status: AC
  Administered 2018-06-20: 200 mg via ORAL
  Filled 2018-06-20 (×2): qty 2

## 2018-06-20 MED ORDER — LITHIUM CARBONATE 300 MG PO CAPS
300.0000 mg | ORAL_CAPSULE | Freq: Every day | ORAL | Status: DC
  Administered 2018-06-20 – 2018-06-21 (×2): 300 mg via ORAL
  Filled 2018-06-20 (×4): qty 1

## 2018-06-20 NOTE — Progress Notes (Signed)
[facility] MD Progress Note  06/20/2018 2:39 PM Autumn Foster  MRN:  [id number] Subjective: Patient is seen and examined.  Patient is a 52 year old female with a long-standing history of depression who was admitted on 8/25 with suicidal ideation.  She is been depressed for over 2 years.  She is been on multiple treatments including ECT, E MDR, PHP, [place]).  Unfortunately none of these medications and treatment modalities were helpful.  She went to some residential program, but was disappointed with that and left.  She expressed her husband that she had plan on killing herself by cutting her wrist with a razor.  She was admitted to the hospital for evaluation and stabilization.  She was placed on Trintellix, and started at 5 mg p.o. daily.  She stated that she continues to be depressed, and we discussed some of her medication options.  She was also placed on mirtazapine.  She stated that she felt significantly drowsy because of the mirtazapine.  We discussed adjusting that dosage.  She also requested to be placed back on Cytomel, MiraLAX, and something for nausea.  Her lithium level on admission was only 0.08.  We discussed potentially increasing her lithium, but she had some form of a presyncopal episode when she took a higher dose of lithium.  She remains significantly depressed and suicidal. Principal Problem: <principal problem not specified> Diagnosis:   Patient Active Problem List   Diagnosis Date Noted  . MDD (major depressive disorder), recurrent severe, without psychosis ([place]) [F33.2] 06/18/2018  . Dizziness [R42] 04/05/2016  . Orthostatic hypotension [I95.1] 04/05/2016  . POTS (postural orthostatic tachycardia syndrome) [R00.0, I95.1] 04/05/2016  . Chest pain [R07.9] 06/20/2014  . Palpitations [R00.2] 06/20/2014  . PVC (premature ventricular contraction) [I49.3] 06/20/2014  . Abdominal pain [R10.9] 11/07/2012  . Anxiety [F41.9] 11/07/2012   Total Time spent with patient: 30 minutes  Past  Psychiatric History: See admission H&P  Past Medical History:  Past Medical History:  Diagnosis Date  . Anemia   . Anxiety   . ASCUS on Pap smear   . Bacterial infection   . Bladder spasm   . Blood in stool   . Cancer ([place])    Basal Cell Skin Cancer  . Candida vaginitis   . Cervical dysplasia   . Colon polyp    tubular adenoma  . Depression   . Dizziness 04/05/2016  . Dysmenorrhea   . Endometrial polyp   . Fatigue   . Genital warts   . Hemorrhoids   . Herpes   . History of chicken pox   . HPV (human papilloma virus) infection   . Low blood pressure   . Lyme disease   . Menorrhagia   . Orthostatic hypotension 04/05/2016  . Osteopenia   . Ovarian cyst   . PMDD (premenstrual dysphoric disorder)   . POTS (postural orthostatic tachycardia syndrome) 04/05/2016  . Rectal bleed   . Urinary frequency   . Urine incontinence   . Yeast infection     Past Surgical History:  Procedure Laterality Date  . COLONOSCOPY  12/29/2010   2 sessile polyps Dr. Fuller Plan  . DILATION AND CURETTAGE OF UTERUS    . HYSTEROSCOPY    . NOVASURE ABLATION     Family History:  Family History  Problem Relation Age of Onset  . Prostate cancer Father   . Hypertension Father   . Renal cancer Father   . Migraines Mother   . Alcohol abuse Mother   . Suicidality Mother  deceased  . Colon cancer Paternal Grandfather   . Dementia Paternal Grandmother   . Thyroid disease Maternal Grandmother   . Rheum arthritis Maternal Grandmother   . Diabetes Maternal Grandfather   . Anxiety disorder Sister        x 2   Family Psychiatric  History: See admission H&P Social History:  Social History   Substance and Sexual Activity  Alcohol Use Yes  . Alcohol/week: 0.0 standard drinks   Comment: 1-2 drinks/week     Social History   Substance and Sexual Activity  Drug Use No    Social History   Socioeconomic History  . Marital status: Married    Spouse name: Preston   . Number of children: 3  .  Years of education: college  . Highest education level: Not on file  Occupational History  . Not on file  Social Needs  . Financial resource strain: Not on file  . Food insecurity:    Worry: Not on file    Inability: Not on file  . Transportation needs:    Medical: Not on file    Non-medical: Not on file  Tobacco Use  . Smoking status: Never Smoker  . Smokeless tobacco: Never Used  Substance and Sexual Activity  . Alcohol use: Yes    Alcohol/week: 0.0 standard drinks    Comment: 1-2 drinks/week  . Drug use: No  . Sexual activity: Yes    Partners: Male    Birth control/protection: None    Comment: vasectony  Lifestyle  . Physical activity:    Days per week: Not on file    Minutes per session: Not on file  . Stress: Not on file  Relationships  . Social connections:    Talks on phone: Not on file    Gets together: Not on file    Attends religious service: Not on file    Active member of club or organization: Not on file    Attends meetings of clubs or organizations: Not on file    Relationship status: Not on file  Other Topics Concern  . Not on file  Social History Narrative   Drinks 1 cup of coffee a day    Additional Social History:    Pain Medications: none noted Prescriptions: Cymbalta 60mg; Lithium 300 mg; Valtrex 500 mg; Synthroid 25 mg; Cytomel 5mg; Progesterone 300mg Over the Counter: none noted History of alcohol / drug use?: No history of alcohol / drug abuse                    Sleep: Fair  Appetite:  Fair  Current Medications: Current Facility-Administered Medications  Medication Dose Route Frequency Provider Last Rate Last Dose  . levothyroxine (SYNTHROID, LEVOTHROID) tablet 25 mcg  25 mcg Oral QAC breakfast Rankin, Shuvon B, NP   25 mcg at 06/20/18 0635  . liothyronine (CYTOMEL) tablet 5 mcg  5 mcg Oral Daily Clary, Greg Lawson, MD   5 mcg at 06/20/18 1315  . lithium carbonate capsule 300 mg  300 mg Oral QHS Clary, Greg Lawson, MD      .  LORazepam (ATIVAN) tablet 0.5 mg  0.5 mg Oral Q6H PRN Cobos, Fernando A, MD   0.5 mg at 06/19/18 1545  . mirtazapine (REMERON) tablet 15 mg  15 mg Oral QHS Clary, Greg Lawson, MD      . ondansetron (ZOFRAN) tablet 4 mg  4 mg Oral Q8H PRN Clary, Greg Lawson, MD      .   polyethylene glycol (MIRALAX / GLYCOLAX) packet 17 g  17 g Oral Daily Clary, Greg Lawson, MD   17 g at 06/20/18 1417  . progesterone (PROMETRIUM) capsule 200 mg  200 mg Oral Daily Rankin, Shuvon B, NP   200 mg at 06/19/18 2140  . valACYclovir (VALTREX) tablet 500 mg  500 mg Oral Daily Clary, Greg Lawson, MD   500 mg at 06/20/18 1314  . [START ON 06/21/2018] vortioxetine HBr (TRINTELLIX) tablet 10 mg  10 mg Oral Daily Clary, Greg Lawson, MD        Lab Results:  Results for orders placed or performed during the hospital encounter of 06/18/18 (from the past 48 hour(s))  CBC     Status: None   Collection Time: 06/18/18  6:21 PM  Result Value Ref Range   WBC 7.1 4.0 - 10.5 K/uL   RBC 4.33 3.87 - 5.11 MIL/uL   Hemoglobin 13.6 12.0 - 15.0 g/dL   HCT 40.6 36.0 - 46.0 %   MCV 93.8 78.0 - 100.0 fL   MCH 31.4 26.0 - 34.0 pg   MCHC 33.5 30.0 - 36.0 g/dL   RDW 12.6 11.5 - 15.5 %   Platelets 309 150 - 400 K/uL    Comment: Performed at [facility], [address] [place], [place] [address]  Comprehensive metabolic panel     Status: Abnormal   Collection Time: 06/18/18  6:21 PM  Result Value Ref Range   Sodium 138 135 - 145 mmol/L   Potassium 4.2 3.5 - 5.1 mmol/L   Chloride 104 98 - 111 mmol/L   CO2 24 22 - 32 mmol/L   Glucose, Bld 134 (H) 70 - 99 mg/dL   BUN 17 6 - 20 mg/dL   Creatinine, Ser 0.68 0.44 - 1.00 mg/dL   Calcium 9.7 8.9 - 10.3 mg/dL   Total Protein 7.1 6.5 - 8.1 g/dL   Albumin 4.6 3.5 - 5.0 g/dL   AST 20 15 - 41 U/L   ALT 16 0 - 44 U/L   Alkaline Phosphatase 40 38 - 126 U/L   Total Bilirubin 0.6 0.3 - 1.2 mg/dL   GFR calc non Af Amer >60 >60 mL/min   GFR calc Af Amer >60 >60 mL/min    Comment:  (NOTE) The eGFR has been calculated using the CKD EPI equation. This calculation has not been validated in all clinical situations. eGFR's persistently <60 mL/min signify possible Chronic Kidney Disease.    Anion gap 10 5 - 15    Comment: Performed at [facility], [address] [place], [place] [address]  Hemoglobin A1c     Status: Abnormal   Collection Time: 06/18/18  6:21 PM  Result Value Ref Range   Hgb A1c MFr Bld 4.7 (L) 4.8 - 5.6 %    Comment: (NOTE) Pre diabetes:          5.7%-6.4% Diabetes:              >6.4% Glycemic control for   <7.0% adults with diabetes    Mean Plasma Glucose 88.19 mg/dL    Comment: Performed at [facility], [address] [place],  [address]  Lipid panel     Status: Abnormal   Collection Time: 06/18/18  6:21 PM  Result Value Ref Range   Cholesterol 226 (H) 0 - 200 mg/dL   Triglycerides 41 <150 mg/dL   HDL 75 >40 mg/dL   Total CHOL/HDL Ratio 3.0 RATIO     VLDL 8 0 - 40 mg/dL   LDL Cholesterol 143 (H) 0 - 99 mg/dL    Comment:        Total Cholesterol/HDL:CHD Risk Coronary Heart Disease Risk Table                     Men   Women  1/2 Average Risk   3.4   3.3  Average Risk       5.0   4.4  2 X Average Risk   9.6   7.1  3 X Average Risk  23.4   11.0        Use the calculated Patient Ratio above and the CHD Risk Table to determine the patient's CHD Risk.        ATP III CLASSIFICATION (LDL):  <100     mg/dL   Optimal  100-129  mg/dL   Near or Above                    Optimal  130-159  mg/dL   Borderline  160-189  mg/dL   High  >190     mg/dL   Very High Performed at [facility], [address] [place], [place] [address]   Ethanol     Status: None   Collection Time: 06/18/18  6:21 PM  Result Value Ref Range   Alcohol, Ethyl (B) <10 <10 mg/dL    Comment: (NOTE) Lowest detectable limit for serum alcohol is 10 mg/dL. For medical purposes only. Performed at [facility], [address] [place], [place] [address]   Lithium level     Status: Abnormal   Collection Time: 06/18/18  6:29 PM  Result Value Ref Range   Lithium Lvl 0.08 (L) 0.60 - 1.20 mmol/L    Comment: Performed at [facility], [address] [place], [place] [address]  Pregnancy, urine     Status: None   Collection Time: 06/19/18  6:31 PM  Result Value Ref Range   Preg Test, Ur NEGATIVE NEGATIVE    Comment:        THE SENSITIVITY OF THIS METHODOLOGY IS >20 mIU/mL. Performed at [facility], [address] [place], [place] [address]     Blood Alcohol level:  Lab Results  Component Value Date   ETH <10 06/18/2018    Metabolic Disorder Labs: Lab Results  Component Value Date   HGBA1C 4.7 (L) 06/18/2018   MPG 88.19 06/18/2018   No results found for: PROLACTIN Lab Results  Component Value Date   CHOL 226 (H) 06/18/2018   TRIG 41 06/18/2018   HDL 75 06/18/2018   CHOLHDL 3.0 06/18/2018   VLDL 8 06/18/2018   LDLCALC 143 (H) 06/18/2018   LDLCALC 98 11/07/2012    Physical Findings: AIMS: Facial and Oral Movements Muscles of Facial Expression: None, normal Lips and Perioral Area: None, normal Jaw: None, normal Tongue: None, normal,Extremity Movements Upper (arms, wrists, hands, fingers): None, normal Lower (legs, knees, ankles, toes): None, normal, Trunk Movements Neck, shoulders, hips: None, normal, Overall Severity Severity of abnormal movements (highest score from questions above): None, normal Incapacitation due to abnormal movements: None, normal Patient's awareness of abnormal movements (rate only patient's report): No Awareness, Dental Status Current problems with teeth and/or dentures?: No Does patient usually wear dentures?: No  CIWA:  CIWA-Ar Total: 1 COWS:  COWS Total Score: 1  Musculoskeletal: Strength & Muscle Tone: within normal limits Gait & Station:   normal Patient leans: N/A  Psychiatric Specialty  Exam: Physical Exam  Nursing note and vitals reviewed. Constitutional: She is oriented to person, place, and time. She appears well-nourished.  HENT:  Head: Normocephalic and atraumatic.  Respiratory: Effort normal.  Neurological: She is alert and oriented to person, place, and time.    ROS  Blood pressure 94/74, pulse 76, temperature 97.9 F (36.6 C), temperature source Oral, resp. rate 16, height 6' (1.829 m), weight 64.4 kg, SpO2 100 %.Body mass index is 19.26 kg/m.  General Appearance: Casual  Eye Contact:  Fair  Speech:  Normal Rate  Volume:  Decreased  Mood:  Depressed  Affect:  Congruent  Thought Process:  Coherent and Descriptions of Associations: Intact  Orientation:  Full (Time, Place, and Person)  Thought Content:  Logical  Suicidal Thoughts:  Yes.  without intent/plan  Homicidal Thoughts:  No  Memory:  Immediate;   Fair Recent;   Fair Remote;   Fair  Judgement:  Impaired  Insight:  Lacking  Psychomotor Activity:  Increased  Concentration:  Concentration: Fair and Attention Span: Fair  Recall:  Fair  Fund of Knowledge:  Fair  Language:  Fair  Akathisia:  Negative  Handed:  Right  AIMS (if indicated):     Assets:  Communication Skills Desire for Improvement Financial Resources/Insurance Housing Intimacy Physical Health Resilience Social Support Talents/Skills  ADL's:  Intact  Cognition:  WNL  Sleep:  Number of Hours: 6.75     Treatment Plan Summary: Daily contact with patient to assess and evaluate symptoms and progress in treatment, Medication management and Plan : Patient is seen and examined.  Patient is a 52-year-old female with the above-stated past psychiatric history seen in follow-up.  We will increase her mirtazapine to 15 mg p.o. nightly for depression, anxiety and to decrease the sedation of this medication.  We will also increase her Trintellix to 10 mg p.o. daily.  I will add Zofran as needed for nausea from Trintellix.  We will continue her  lithium carbonate at 300 mg p.o. nightly.  Cytomel, valacyclovir, were left off her medication list.  Those will be restarted.  Hopefully the changes above will be of benefit.  Greg Lawson Clary, MD 06/20/2018, 2:39 PM 

## 2018-06-20 NOTE — Progress Notes (Addendum)
Patient reports that she removed a tick from her right ankle last night. States that she went to the woods on Saturday to use the bathroom and feels this is when the tick attached itself. States that the tick was tiny. The patient does have a small area of cellulitis around 1 mm above her right ankle. No erythema migrans noted. She is requesting doxcycline. Due to possibility of tick attachment less than 36 hours and the patient description of tick as not engorged, will prescribe doxycycline 200 mg x 1 dose.

## 2018-06-20 NOTE — Progress Notes (Signed)
:  Patient stated "I need valtrex in the morning. Cytomel before breakfast with my synthroid.  And also needs zyrtec  I think that is it."

## 2018-06-20 NOTE — Plan of Care (Signed)
Nurse discussed anxiety, depression, coping skills with patient. 

## 2018-06-20 NOTE — [facility] Group Notes (Signed)
[facility] Group Notes:  (Nursing/MHT/Case Management/Adjunct)  Date:  06/20/2018  Time:  3:15 pm  Type of Therapy:  Psychoeducational Skills  Participation Level:  Active  Participation Quality:  Appropriate  Affect:  Appropriate  Cognitive:  Appropriate  Insight:  Appropriate  Engagement in Group:  Engaged  Modes of Intervention:  Education  Summary of Progress/Problems: Patient participated appropriately in group.   Cammy Copa 06/20/2018, 5:50 PM

## 2018-06-20 NOTE — [facility] Group Notes (Signed)
[facility] Group Notes:  (Nursing/MHT/Case Management/Adjunct)  Date:   06/19/2018 Time:  4:00 pm  Type of Therapy:  Psychoeducational Skills  Participation Level:  Active  Participation Quality:  Appropriate  Affect:  Appropriate  Cognitive:  Appropriate  Insight:  Appropriate  Engagement in Group:  Engaged  Modes of Intervention:  Education  Summary of Progress/Problems:   Patient participated appropriately.  Cammy Copa 06/20/2018, 8:47 AM

## 2018-06-20 NOTE — Progress Notes (Signed)
Patient stated "I need to take miralax.  Last BM small amount yesterday."

## 2018-06-20 NOTE — Progress Notes (Signed)
Patient stated "The remeron at night, she feels very groggy today.  Could she take a lower dose."

## 2018-06-20 NOTE — Progress Notes (Signed)
Recreation Therapy Notes  Animal-Assisted Activity (AAA) Program Checklist/Progress Notes Patient Eligibility Criteria Checklist & Daily Group note for Rec Tx Intervention  Date: 8.27.19 Time: 45 Location: [address]   AAA/T Program Assumption of Risk Form signed by Teacher, music or Parent Legal Guardian YES   Patient is free of allergies or sever asthma  YES   Patient reports no fear of animals  YES  Patient reports no history of cruelty to animals YES   Patient understands his/her participation is voluntary YES   Patient washes hands before animal contact YES   Patient washes hands after animal contact  YES   Behavioral Response: Engaged  Education: Contractor, Appropriate Animal Interaction   Education Outcome: Acknowledges understanding/In group clarification offered/Needs additional education.   Clinical Observations/Feedback: Pt attended and participated in group.    Victorino Sparrow, LRT/CTRS         Victorino Sparrow A 06/20/2018 3:38 PM

## 2018-06-20 NOTE — Progress Notes (Signed)
D:  Patient's self inventory sheet, patient sleeps good, no sleep medication given.  Fair appetite, normal energy level, good concentration.  Rated depression and hopeless 3, anxiety 3.  Denied withdrawals.  Denied SI.  Physical problems, constipation.  Denied physical pain.  Goal is positive attitude.  Also planning what to do when discharged.  Plans to try to engage with others and attend all sessions.  Needs valtrex.  Working on discharge plans. A:  Medications administered per MD orders.  Emotional support and encouragement given patient. R:  Denied SI and HI while talking to nurse this morning.  Denied A/V hallucinations.  Safety maintained with 15 minute checks.

## 2018-06-21 MED ORDER — CETIRIZINE HCL 10 MG PO TABS
10.0000 mg | ORAL_TABLET | Freq: Every day | ORAL | Status: DC
  Administered 2018-06-21 – 2018-06-22 (×2): 10 mg via ORAL
  Filled 2018-06-21 (×4): qty 1

## 2018-06-21 MED ORDER — PROGESTERONE MICRONIZED 200 MG PO CAPS
200.0000 mg | ORAL_CAPSULE | Freq: Every day | ORAL | Status: DC
  Administered 2018-06-21: 200 mg via ORAL
  Filled 2018-06-21 (×3): qty 1

## 2018-06-21 MED ORDER — MIRTAZAPINE 30 MG PO TABS
30.0000 mg | ORAL_TABLET | Freq: Every day | ORAL | Status: DC
  Administered 2018-06-21: 30 mg via ORAL
  Filled 2018-06-21 (×2): qty 1

## 2018-06-21 MED ORDER — RISAQUAD PO CAPS
1.0000 | ORAL_CAPSULE | Freq: Every day | ORAL | Status: DC
  Administered 2018-06-21 – 2018-06-22 (×2): 1 via ORAL
  Filled 2018-06-21 (×4): qty 1

## 2018-06-21 MED ORDER — DOXYCYCLINE HYCLATE 100 MG PO TABS
100.0000 mg | ORAL_TABLET | Freq: Two times a day (BID) | ORAL | Status: DC
  Administered 2018-06-21 – 2018-06-22 (×3): 100 mg via ORAL
  Filled 2018-06-21 (×7): qty 1

## 2018-06-21 NOTE — Progress Notes (Signed)
Adult Psychoeducational Group Note  Date:  06/21/2018 Time:  9:37 PM  Group Topic/Focus:  Wrap-Up Group:   The focus of this group is to help patients review their daily goal of treatment and discuss progress on daily workbooks.  Participation Level:  Active  Participation Quality:  Appropriate  Affect:  Appropriate  Cognitive:  Alert and Oriented  Insight: Appropriate  Engagement in Group:  Developing/Improving  Modes of Intervention:  Exploration and Support  Additional Comments:  Pt rated her day an 8. Pt verbalized that she's going home tomorrow. Pt verbalized that she is looking forward to the outpatient program. Pt verbalized something positive is that she enjoyed talking with everyone.   Gitty Osterlund, Patrick North 06/21/2018, 9:37 PM

## 2018-06-21 NOTE — Progress Notes (Signed)
Pt presents with a flat affect and anxious mood. Pt rated on her self inventory sheet: depression 4/10, anxiety 2/10 and hopelessness 2/10. Pt reported fair sleep last night. Pt denies SI/HI. Pt requested to have Prometrium scheduled at bedtime. Writer informed MD of pt's request. Per  MD, writer may modify order to bedtime. Pt then requested to have Zyrtec ordered. MD made aware. Pt verbalized that she wanted to speak with the MD about taking Doxycycline for seven days for a tick bite. Writer notified MD of pt's request.   Orders reviewed with pt. Verbal support provided. Pt encouraged to attend groups. 15 minute checks performed for safety.  Pt compliant with tx plan.

## 2018-06-21 NOTE — [facility] Suicide Risk Assessment (Signed)
[facility] INPATIENT:  Family/Significant Other Suicide Prevention Education  Suicide Prevention Education:  Education Completed;Preston Leatham, husband, [phone],  has been identified by the patient as the family member/significant other with whom the patient will be residing, and identified as the person(s) who will aid the patient in the event of a mental health crisis (suicidal ideations/suicide attempt).  With written consent from the patient, the family member/significant other has been provided the following suicide prevention education, prior to the and/or following the discharge of the patient.  The suicide prevention education provided includes the following:  Suicide risk factors  Suicide prevention and interventions  National Suicide Hotline telephone number  [facility] assessment telephone number  [facility] Emergency Assistance [place] and/or Residential Mobile Crisis Unit telephone number  Request made of family/significant other to:  Remove weapons (e.g., guns, rifles, knives), all items previously/currently identified as safety concern.  Husband reports he does have guns but they are locked.  Remove drugs/medications (over-the-counter, prescriptions, illicit drugs), all items previously/currently identified as a safety concern.  The family member/significant other verbalizes understanding of the suicide prevention education information provided.  The family member/significant other agrees to remove the items of safety concern listed above.  Jaci Standard reports he has visited pt each night.  She seemed much better last night.  We discussed possible discharge tomorrow.  He will visit again tonight and call back with any concerns.  He is in agreement with day program plan as pt does not seem to do well being at home alone during the day.  Pt has tried a lot of treatment without much success over the past two years.  Joanne Chars,  LCSW 06/21/2018, 3:05 PM

## 2018-06-21 NOTE — Progress Notes (Signed)
[facility] MD Progress Note  06/21/2018 3:18 PM Autumn Foster  MRN:  [id number] Subjective: Patient is seen and examined.  Patient is a 52 year old female with a long-standing history of major depression who was admitted on 8/25 with suicidal ideation.  She is essentially unchanged today.  She states she is more anxious.  She is wondering whether or not the increase Trintellix led to anxiety.  We had a long discussion today about coping skills, and working on other items besides psychiatric medications.  We also discussed ketamine infusions.  I gave her the phone numbers of ketamine clinics near her residence.  She focused on a tick bite that she had on her left leg.  She had gotten doxycycline x1 dose, but wants to continue it because she apparently has had an infection from a tick bite before.  I warned her about the possibility of C. difficile that could occur with broad-spectrum antibiotics, but she is frightened by the prospect of getting a tickborne illness.  I did start her on lactobacillus as well as the antibiotics. Principal Problem: <principal problem not specified> Diagnosis:   Patient Active Problem List   Diagnosis Date Noted  . MDD (major depressive disorder), recurrent severe, without psychosis ([place]) [F33.2] 06/18/2018  . Dizziness [R42] 04/05/2016  . Orthostatic hypotension [I95.1] 04/05/2016  . POTS (postural orthostatic tachycardia syndrome) [R00.0, I95.1] 04/05/2016  . Chest pain [R07.9] 06/20/2014  . Palpitations [R00.2] 06/20/2014  . PVC (premature ventricular contraction) [I49.3] 06/20/2014  . Abdominal pain [R10.9] 11/07/2012  . Anxiety [F41.9] 11/07/2012   Total Time spent with patient: 15 minutes  Past Psychiatric History: See admission H&P  Past Medical History:  Past Medical History:  Diagnosis Date  . Anemia   . Anxiety   . ASCUS on Pap smear   . Bacterial infection   . Bladder spasm   . Blood in stool   . Cancer ([place])    Basal Cell Skin Cancer  . Candida  vaginitis   . Cervical dysplasia   . Colon polyp    tubular adenoma  . Depression   . Dizziness 04/05/2016  . Dysmenorrhea   . Endometrial polyp   . Fatigue   . Genital warts   . Hemorrhoids   . Herpes   . History of chicken pox   . HPV (human papilloma virus) infection   . Low blood pressure   . Lyme disease   . Menorrhagia   . Orthostatic hypotension 04/05/2016  . Osteopenia   . Ovarian cyst   . PMDD (premenstrual dysphoric disorder)   . POTS (postural orthostatic tachycardia syndrome) 04/05/2016  . Rectal bleed   . Urinary frequency   . Urine incontinence   . Yeast infection     Past Surgical History:  Procedure Laterality Date  . COLONOSCOPY  12/29/2010   2 sessile polyps Dr. Fuller Plan  . DILATION AND CURETTAGE OF UTERUS    . HYSTEROSCOPY    . NOVASURE ABLATION     Family History:  Family History  Problem Relation Age of Onset  . Prostate cancer Father   . Hypertension Father   . Renal cancer Father   . Migraines Mother   . Alcohol abuse Mother   . Suicidality Mother        deceased  . Colon cancer Paternal Grandfather   . Dementia Paternal Grandmother   . Thyroid disease Maternal Grandmother   . Rheum arthritis Maternal Grandmother   . Diabetes Maternal Grandfather   . Anxiety disorder Sister  x 2   Family Psychiatric  History: See admission H&P Social History:  Social History   Substance and Sexual Activity  Alcohol Use Yes  . Alcohol/week: 0.0 standard drinks   Comment: 1-2 drinks/week     Social History   Substance and Sexual Activity  Drug Use No    Social History   Socioeconomic History  . Marital status: Married    Spouse name: Jaci Standard   . Number of children: 3  . Years of education: college  . Highest education level: Not on file  Occupational History  . Not on file  Social Needs  . Financial resource strain: Not on file  . Food insecurity:    Worry: Not on file    Inability: Not on file  . Transportation needs:    Medical:  Not on file    Non-medical: Not on file  Tobacco Use  . Smoking status: Never Smoker  . Smokeless tobacco: Never Used  Substance and Sexual Activity  . Alcohol use: Yes    Alcohol/week: 0.0 standard drinks    Comment: 1-2 drinks/week  . Drug use: No  . Sexual activity: Yes    Partners: Male    Birth control/protection: None    Comment: vasectony  Lifestyle  . Physical activity:    Days per week: Not on file    Minutes per session: Not on file  . Stress: Not on file  Relationships  . Social connections:    Talks on phone: Not on file    Gets together: Not on file    Attends religious service: Not on file    Active member of club or organization: Not on file    Attends meetings of clubs or organizations: Not on file    Relationship status: Not on file  Other Topics Concern  . Not on file  Social History Narrative   Drinks 1 cup of coffee a day    Additional Social History:    Pain Medications: none noted Prescriptions: Cymbalta 60mg ; Lithium 300 mg; Valtrex 500 mg; Synthroid 25 mg; Cytomel 5mg ; Progesterone 300mg  Over the Counter: none noted History of alcohol / drug use?: No history of alcohol / drug abuse                    Sleep: Fair  Appetite:  Fair  Current Medications: Current Facility-Administered Medications  Medication Dose Route Frequency Provider Last Rate Last Dose  . acidophilus (RISAQUAD) capsule 1 capsule  1 capsule Oral Daily Sharma Covert, MD      . cetirizine (ZYRTEC) tablet 10 mg  10 mg Oral Daily Cobos, Myer Peer, MD   10 mg at 06/21/18 1021  . doxycycline (VIBRA-TABS) tablet 100 mg  100 mg Oral Q12H Sharma Covert, MD   100 mg at 06/21/18 1505  . levothyroxine (SYNTHROID, LEVOTHROID) tablet 25 mcg  25 mcg Oral QAC breakfast Rankin, Shuvon B, NP   25 mcg at 06/21/18 0624  . liothyronine (CYTOMEL) tablet 5 mcg  5 mcg Oral Daily Sharma Covert, MD   5 mcg at 06/21/18 0750  . lithium carbonate capsule 300 mg  300 mg Oral QHS  Sharma Covert, MD   300 mg at 06/20/18 2117  . LORazepam (ATIVAN) tablet 0.5 mg  0.5 mg Oral Q6H PRN Cobos, Myer Peer, MD   0.5 mg at 06/21/18 1426  . mirtazapine (REMERON) tablet 30 mg  30 mg Oral QHS Sharma Covert, MD      .  ondansetron (ZOFRAN) tablet 4 mg  4 mg Oral Q8H PRN Sharma Covert, MD      . polyethylene glycol (MIRALAX / GLYCOLAX) packet 17 g  17 g Oral Daily Sharma Covert, MD   17 g at 06/21/18 0750  . progesterone (PROMETRIUM) capsule 200 mg  200 mg Oral QHS Cobos, Fernando A, MD      . valACYclovir (VALTREX) tablet 500 mg  500 mg Oral Daily Sharma Covert, MD   500 mg at 06/21/18 0750  . vortioxetine HBr (TRINTELLIX) tablet 10 mg  10 mg Oral Daily Sharma Covert, MD   10 mg at 06/21/18 1022    Lab Results:  Results for orders placed or performed during the hospital encounter of 06/18/18 (from the past 48 hour(s))  Pregnancy, urine     Status: None   Collection Time: 06/19/18  6:31 PM  Result Value Ref Range   Preg Test, Ur NEGATIVE NEGATIVE    Comment:        THE SENSITIVITY OF THIS METHODOLOGY IS >20 mIU/mL. Performed at [facility] [place] [address], [place], [place] [address]     Blood Alcohol level:  Lab Results  Component Value Date   ETH <10 60/63/0160    Metabolic Disorder Labs: Lab Results  Component Value Date   HGBA1C 4.7 (L) 06/18/2018   MPG 88.19 06/18/2018   No results found for: PROLACTIN Lab Results  Component Value Date   CHOL 226 (H) 06/18/2018   TRIG 41 06/18/2018   HDL 75 06/18/2018   CHOLHDL 3.0 06/18/2018   VLDL 8 06/18/2018   LDLCALC 143 (H) 06/18/2018   LDLCALC 98 11/07/2012    Physical Findings: AIMS: Facial and Oral Movements Muscles of Facial Expression: None, normal Lips and Perioral Area: None, normal Jaw: None, normal Tongue: None, normal,Extremity Movements Upper (arms, wrists, hands, fingers): None, normal Lower (legs, knees, ankles, toes): None, normal, Trunk  Movements Neck, shoulders, hips: None, normal, Overall Severity Severity of abnormal movements (highest score from questions above): None, normal Incapacitation due to abnormal movements: None, normal Patient's awareness of abnormal movements (rate only patient's report): No Awareness, Dental Status Current problems with teeth and/or dentures?: No Does patient usually wear dentures?: No  CIWA:  CIWA-Ar Total: 1 COWS:  COWS Total Score: 1  Musculoskeletal: Strength & Muscle Tone: within normal limits Gait & Station: normal Patient leans: N/A  Psychiatric Specialty Exam: Physical Exam  Nursing note and vitals reviewed. Constitutional: She is oriented to person, place, and time. She appears well-developed and well-nourished.  HENT:  Head: Normocephalic and atraumatic.  Respiratory: Effort normal.  Neurological: She is alert and oriented to person, place, and time.    ROS  Blood pressure 100/71, pulse 78, temperature 97.9 F (36.6 C), temperature source Oral, resp. rate 16, height 6' (1.829 m), weight 64.4 kg, SpO2 100 %.Body mass index is 19.26 kg/m.  General Appearance: Casual  Eye Contact:  Fair  Speech:  Normal Rate  Volume:  Normal  Mood:  Anxious and Depressed  Affect:  Congruent  Thought Process:  Coherent and Descriptions of Associations: Intact  Orientation:  Full (Time, Place, and Person)  Thought Content:  Logical  Suicidal Thoughts:  Yes.  without intent/plan  Homicidal Thoughts:  No  Memory:  Immediate;   Fair Recent;   Fair Remote;   Fair  Judgement:  Intact  Insight:  Fair  Psychomotor Activity:  Increased  Concentration:  Concentration: Fair and Attention Span: Fair  Recall:  Smiley Houseman of Knowledge:  Fair  Language:  Good  Akathisia:  Negative  Handed:  Right  AIMS (if indicated):     Assets:  Communication Skills Desire for Improvement Financial Resources/Insurance Housing Intimacy Leisure Time Physical Health Resilience Social  Support Talents/Skills  ADL's:  Intact  Cognition:  WNL  Sleep:  Number of Hours: 6.5     Treatment Plan Summary: Daily contact with patient to assess and evaluate symptoms and progress in treatment, Medication management and Plan : Patient is seen and examined.  Patient is a 52 year old female with the above-stated past psychiatric history seen in follow-up.  We had increased her Trintellix yesterday to 10 mg p.o. daily.  We will not change that today.  She is still sedated from the mirtazapine, and I will increase that to 30 mg p.o. nightly.  She is requesting doxycycline, and I tried to talk her out of it, but she wants it because of concern for tickborne illness.  We will do 100 mg p.o. daily.  She continues on her levothyroxine as well as liothyronine.  She also continues on lithium.  No other changes to her medications.  Sharma Covert, MD 06/21/2018, 3:18 PM

## 2018-06-21 NOTE — BHH Group Notes (Signed)
[place] LCSW Group Therapy Note  Date/Time: 06/20/18, 1315  Type of Therapy/Topic:  Group Therapy:  Feelings about Diagnosis  Participation Level:  Active   Mood:pleasant   Description of Group:    This group will allow patients to explore their thoughts and feelings about diagnoses they have received. Patients will be guided to explore their level of understanding and acceptance of these diagnoses. Facilitator will encourage patients to process their thoughts and feelings about the reactions of others to their diagnosis, and will guide patients in identifying ways to discuss their diagnosis with significant others in their lives. This group will be process-oriented, with patients participating in exploration of their own experiences as well as giving and receiving support and challenge from other group members.   Therapeutic Goals: 1. Patient will demonstrate understanding of diagnosis as evidence by identifying two or more symptoms of the disorder:  2. Patient will be able to express two feelings regarding the diagnosis 3. Patient will demonstrate ability to communicate their needs through discussion and/or role plays  Summary of Patient Progress:Pt active during group discussion regarding diagnosis and acceptance of a mental health diagnosis.  Pt very engaged and talking about her situation.        Therapeutic Modalities:   Cognitive Behavioral Therapy Brief Therapy Feelings Identification   Lurline Idol, LCSW

## 2018-06-21 NOTE — Progress Notes (Signed)
Recreation Therapy Notes  Date: 8.28.19 Time: 0930 Location: [address] Dayroom  Group Topic: Stress Management  Goal Area(s) Addresses:  Patient will verbalize importance of using healthy stress management.  Patient will identify positive emotions associated with healthy stress management.   Intervention: Architect paper, markers  Activity :  Patients were given a sheet of construction paper and a marker.  Patients were to trace their hands on the paper.  On the right hand, patients were to write down the things that cause them stress.  On the the left hand, they were to write all positive coping skills they use to help them deal with their stresses.  Education: Stress Management, Discharge Planning.   Education Outcome: Acknowledges edcuation/In group clarification offered/Needs additional education  Clinical Observations/Feedback: Pt did not attend group.     Victorino Sparrow, LRT/CTRS        Victorino Sparrow A 06/21/2018 11:27 AM

## 2018-06-21 NOTE — Plan of Care (Signed)
D: Pt denies SI/HI/AV hallucinations. Pt observed in milieu with peers. Pt goal for today is to work on thinking positive. A: Pt was offered support and encouragement. Pt was given scheduled medications. Pt was encourage to attend groups. Q 15 minute checks were done for safety.  R:Pt attends groups and interacts well with peers and staff. Pt is taking medication. Pt has no complaints.Pt receptive to treatment and safety maintained on unit.    Problem: Coping: Goal: Ability to interact with others will improve Outcome: Progressing

## 2018-06-21 NOTE — Progress Notes (Signed)
Pt was observed in the dayroom, seen interacting with peers. Pt appears animated/ancious in affect and mood. Pt denies SI/HI/AVH/Pain at this time. Pt states she hopes to be discharge soon. No new c/o's. Remeron was increase this evening. Support and encouragement provided. Will continue with POC.

## 2018-06-21 NOTE — BHH Group Notes (Signed)
[place] Group Notes:  (Nursing/MHT/Case Management/Adjunct)  Date:  06/21/2018  Time:  1:30 PM  Type of Therapy:  Nurse Education  Participation Level:  Active  Participation Quality:  Appropriate and Attentive  Affect:  Anxious and Appropriate  Cognitive:  Alert and Appropriate  Insight:  Appropriate  Engagement in Group:  Developing/Improving and Engaged  Modes of Intervention:  Discussion, Education and Socialization  Summary of Progress/Problems: Pt was appropriate. Pt shared and participated in guided imagery/mindful meditation.   Otelia Limes Justinian Miano 06/21/2018, 3:56 PM

## 2018-06-21 NOTE — BHH Suicide Risk Assessment (Signed)
Autumn Foster INPATIENT:  Family/Significant Other Suicide Prevention Education  Suicide Prevention Education:  Contact Attempts: Autumn Foster, [phone], has been identified by the patient as the family member/significant other with whom the patient will be residing, and identified as the person(s) who will aid the patient in the event of a mental health crisis.  With written consent from the patient, two attempts were made to provide suicide prevention education, prior to and/or following the patient's discharge.  We were unsuccessful in providing suicide prevention education.  A suicide education pamphlet was given to the patient to share with family/significant other.  Date and time of first attempt:06/21/18, 1314 Date and time of second attempt:  Joanne Chars, LCSW 06/21/2018, 1:14 PM

## 2018-06-22 ENCOUNTER — Encounter (HOSPITAL_COMMUNITY): Payer: Self-pay | Admitting: Behavioral Health

## 2018-06-22 ENCOUNTER — Ambulatory Visit (INDEPENDENT_AMBULATORY_CARE_PROVIDER_SITE_OTHER): Payer: BLUE CROSS/BLUE SHIELD | Admitting: Psychiatry

## 2018-06-22 DIAGNOSIS — F332 Major depressive disorder, recurrent severe without psychotic features: Secondary | ICD-10-CM | POA: Diagnosis not present

## 2018-06-22 MED ORDER — VORTIOXETINE HBR 10 MG PO TABS: 10.0000 mg | ORAL_TABLET | Freq: Every day | ORAL | 0 refills | Status: DC

## 2018-06-22 MED ORDER — MIRTAZAPINE 15 MG PO TABS
15.0000 mg | ORAL_TABLET | Freq: Every day | ORAL | Status: DC
  Filled 2018-06-22 (×2): qty 1

## 2018-06-22 MED ORDER — LEVOTHYROXINE SODIUM 25 MCG PO TABS: 25.0000 ug | ORAL_TABLET | Freq: Every day | ORAL | 0 refills | Status: DC

## 2018-06-22 MED ORDER — DOXYCYCLINE HYCLATE 100 MG PO TABS: 100.0000 mg | ORAL_TABLET | Freq: Two times a day (BID) | ORAL | 0 refills | Status: AC

## 2018-06-22 MED ORDER — LIOTHYRONINE SODIUM 5 MCG PO TABS: 5.0000 ug | ORAL_TABLET | Freq: Every day | ORAL | 0 refills | Status: DC

## 2018-06-22 MED ORDER — RISAQUAD PO CAPS: 1.0000 | ORAL_CAPSULE | Freq: Every day | ORAL | 0 refills | Status: DC

## 2018-06-22 MED ORDER — MIRTAZAPINE 15 MG PO TABS: 15.0000 mg | ORAL_TABLET | Freq: Every day | ORAL | 0 refills | Status: DC

## 2018-06-22 MED ORDER — LITHIUM CARBONATE 300 MG PO CAPS: 300.0000 mg | ORAL_CAPSULE | Freq: Every day | ORAL | 0 refills | Status: DC

## 2018-06-22 NOTE — Progress Notes (Signed)
Patient discharged to lobby. Patient was stable and appreciative at that time. All papers and prescriptions were given and valuables returned. Verbal understanding expressed. Denies SI/HI and A/VH. Patient given opportunity to express concerns and ask questions.  

## 2018-06-22 NOTE — Progress Notes (Signed)
  [facility] Adult Case Management Discharge Plan :  Will you be returning to the same living situation after discharge:  Yes,  own home At discharge, do you have transportation home?: Yes,  son Do you have the ability to pay for your medications: Yes,  BCBS  Release of information consent forms completed and in the chart;  Patient's signature needed at discharge.  Patient to Follow up at: Follow-up Information    [facility]. Go on 06/22/2018.   Specialty:  Behavioral Health Why:  Please attend your intake appt at 2:00pm for Partial Hospitalization Program. Contact information: [place] [phone]          Next level of care provider has access to [place] and Suicide Prevention discussed: Yes,  with husband  Have you used any form of tobacco in the last 30 days? (Cigarettes, Smokeless Tobacco, Cigars, and/or Pipes): Patient Refused Screening  Has patient been referred to the Quitline?: N/A patient is not a smoker  Patient has been referred for addiction treatment: N/A  Joanne Chars, LCSW 06/22/2018, 11:13 AM

## 2018-06-22 NOTE — Progress Notes (Cosign Needed)
Pt states Remeron 30 made her felt restless. Pt states she is tossing and turning in bed. PRN ativan requested and given.

## 2018-06-22 NOTE — BHH Suicide Risk Assessment (Signed)
[facility] Discharge Suicide Risk Assessment   Principal Problem: <principal problem not specified> Discharge Diagnoses:  Patient Active Problem List   Diagnosis Date Noted  . MDD (major depressive disorder), recurrent severe, without psychosis ([place]) [F33.2] 06/18/2018  . Dizziness [R42] 04/05/2016  . Orthostatic hypotension [I95.1] 04/05/2016  . POTS (postural orthostatic tachycardia syndrome) [R00.0, I95.1] 04/05/2016  . Chest pain [R07.9] 06/20/2014  . Palpitations [R00.2] 06/20/2014  . PVC (premature ventricular contraction) [I49.3] 06/20/2014  . Abdominal pain [R10.9] 11/07/2012  . Anxiety [F41.9] 11/07/2012    Total Time spent with patient: 15 minutes  Musculoskeletal: Strength & Muscle Tone: within normal limits Gait & Station: normal Patient leans: N/A  Psychiatric Specialty Exam: Review of Systems  All other systems reviewed and are negative.   Blood pressure 96/76, pulse 88, temperature 98.7 F (37.1 C), temperature source Oral, resp. rate 16, height 6' (1.829 m), weight 64.4 kg, SpO2 100 %.Body mass index is 19.26 kg/m.  General Appearance: Casual  Eye Contact::  Good  Speech:  Normal Rate409  Volume:  Normal  Mood:  Anxious  Affect:  Congruent  Thought Process:  Coherent and Descriptions of Associations: Intact  Orientation:  Full (Time, Place, and Person)  Thought Content:  Logical  Suicidal Thoughts:  No  Homicidal Thoughts:  No  Memory:  Immediate;   Fair Recent;   Fair Remote;   Fair  Judgement:  Intact  Insight:  Fair  Psychomotor Activity:  Increased  Concentration:  Good  Recall:  Good  Fund of Knowledge:Good  Language: Good  Akathisia:  Negative  Handed:  Right  AIMS (if indicated):     Assets:  Communication Skills Desire for Improvement Financial Resources/Insurance Housing Intimacy Leisure Time Physical Health Resilience Social Support Talents/Skills  Sleep:  Number of Hours: 6.75  Cognition: WNL  ADL's:  Intact   Mental Status Per  Nursing Assessment::   On Admission:  Suicidal ideation indicated by patient, Suicide plan, Self-harm thoughts, Intention to act on suicide plan  Demographic Factors:  Caucasian  Loss Factors: NA  Historical Factors: Impulsivity  Risk Reduction Factors:   Sense of responsibility to family, Living with another person, especially a relative, Positive social support and Positive coping skills or problem solving skills  Continued Clinical Symptoms:  Severe Anxiety and/or Agitation Depression:   Impulsivity  Cognitive Features That Contribute To Risk:  None    Suicide Risk:  Minimal: No identifiable suicidal ideation.  Patients presenting with no risk factors but with morbid ruminations; may be classified as minimal risk based on the severity of the depressive symptoms  Follow-up [facility]. Go on 06/22/2018.   Specialty:  Behavioral Health Why:  Please attend your intake appt at 2:00pm for Partial Hospitalization Program. Contact information: [place] [phone]          Plan Of Care/Follow-up recommendations:  Activity:  ad lib  Sharma Covert, MD 06/22/2018, 9:41 AM

## 2018-06-22 NOTE — Discharge Summary (Signed)
Physician Discharge Summary Note  Patient:  Autumn Foster is an 52 y.o., female MRN:  [id number] DOB:  November 07, 1965 Patient phone:  [phone] (home)  Patient address:   [place] [address],  Total Time spent with patient: 30 minutes  Date of Admission:  06/18/2018 Date of Discharge: 06/22/2018  Reason for Admission:  suicidal ideations, depression, anxiety   Principal Problem: MDD (major depressive disorder), recurrent severe, without psychosis ([place]) Discharge Diagnoses: Patient Active Problem List   Diagnosis Date Noted  . MDD (major depressive disorder), recurrent severe, without psychosis ([place]) [F33.2] 06/18/2018  . Dizziness [R42] 04/05/2016  . Orthostatic hypotension [I95.1] 04/05/2016  . POTS (postural orthostatic tachycardia syndrome) [R00.0, I95.1] 04/05/2016  . Chest pain [R07.9] 06/20/2014  . Palpitations [R00.2] 06/20/2014  . PVC (premature ventricular contraction) [I49.3] 06/20/2014  . Abdominal pain [R10.9] 11/07/2012  . Anxiety [F41.9] 11/07/2012    Past Psychiatric History: reports one prior psychiatric admission for depression and suicidal ideation. States she has never attempted suicide, denies history of self cutting, denies history of psychosis. Denies history of violence. Denies history of mania or hypomania. Describes history of anxiety, describes panic symptoms, excessive worrying, denies agoraphobia. Denies history of psychosis. Denies history of anxorexia nervosa or eating disorder   Past Medical History:  Past Medical History:  Diagnosis Date  . Anemia   . Anxiety   . ASCUS on Pap smear   . Bacterial infection   . Bladder spasm   . Blood in stool   . Cancer ([place])    Basal Cell Skin Cancer  . Candida vaginitis   . Cervical dysplasia   . Colon polyp    tubular adenoma  . Depression   . Dizziness 04/05/2016  . Dysmenorrhea   . Endometrial polyp   . Fatigue   . Genital warts   . Hemorrhoids   . Herpes   . History of  chicken pox   . HPV (human papilloma virus) infection   . Low blood pressure   . Lyme disease   . Menorrhagia   . Orthostatic hypotension 04/05/2016  . Osteopenia   . Ovarian cyst   . PMDD (premenstrual dysphoric disorder)   . POTS (postural orthostatic tachycardia syndrome) 04/05/2016  . Rectal bleed   . Urinary frequency   . Urine incontinence   . Yeast infection     Past Surgical History:  Procedure Laterality Date  . COLONOSCOPY  12/29/2010   2 sessile polyps Dr. Fuller Plan  . DILATION AND CURETTAGE OF UTERUS    . HYSTEROSCOPY    . NOVASURE ABLATION     Family History:  Family History  Problem Relation Age of Onset  . Prostate cancer Father   . Hypertension Father   . Renal cancer Father   . Migraines Mother   . Alcohol abuse Mother   . Suicidality Mother        deceased  . Colon cancer Paternal Grandfather   . Dementia Paternal Grandmother   . Thyroid disease Maternal Grandmother   . Rheum arthritis Maternal Grandmother   . Diabetes Maternal Grandfather   . Anxiety disorder Sister        x 2   Family Psychiatric  History:  mother had history of depression, alcohol use disorder and committed suicide 12 years ago. Son has history of anxiety Social History:  Social History   Substance and Sexual Activity  Alcohol Use Yes  . Alcohol/week: 0.0 standard drinks   Comment: 1-2 drinks/week  Social History   Substance and Sexual Activity  Drug Use No    Social History   Socioeconomic History  . Marital status: Married    Spouse name: Jaci Standard   . Number of children: 3  . Years of education: college  . Highest education level: Not on file  Occupational History  . Not on file  Social Needs  . Financial resource strain: Not on file  . Food insecurity:    Worry: Not on file    Inability: Not on file  . Transportation needs:    Medical: Not on file    Non-medical: Not on file  Tobacco Use  . Smoking status: Never Smoker  . Smokeless tobacco: Never Used   Substance and Sexual Activity  . Alcohol use: Yes    Alcohol/week: 0.0 standard drinks    Comment: 1-2 drinks/week  . Drug use: No  . Sexual activity: Yes    Partners: Male    Birth control/protection: None    Comment: vasectony  Lifestyle  . Physical activity:    Days per week: Not on file    Minutes per session: Not on file  . Stress: Not on file  Relationships  . Social connections:    Talks on phone: Not on file    Gets together: Not on file    Attends religious service: Not on file    Active member of club or organization: Not on file    Attends meetings of clubs or organizations: Not on file    Relationship status: Not on file  Other Topics Concern  . Not on file  Social History Narrative   Drinks 1 cup of coffee a day     Hospital Course:  52 year old married female who  presented to the hospital with her husband. Reports history of chronic depression and anxiety which started in 2017. States she had some prior depressive episodes before then but they were generally mild . She has been experiencing suicidal ideations and recently has had thoughts of cutting wrists or hanging herself. States that yesterday she did hold a razor but did not actually cut self. Told her husband, who encouraged her to come to hospital.  States her depression is chronic , and she feels she is no longer able to enjoy activities or function at her prior level of functioning due to her depression. States she has been feeling worse  over the last several weeks. She recently had gone to a  a residential program in [place], but stayed only 2-3 days because she did not feel it was a good fit for her, as it was more focused on addiction issues, which she denies having .Of note, states that while she was there Cymbalta was titrated from 30 mgrs  up to 60 mgrs QDAY , but feels this titration has not been helping and has been causing poor appetite.  Endorses neuro-vegetative symptoms as below.  Amel was started  on medication regimen for presenting symptoms. She was medicated & discharged on;  Remeron 15 mgrs QHS, Trintellix 10 mgrs QDAY   Lithium 300 mgrs QDAY for augmentation, Synthroid 25 micrograms QDAY for augmentation. She was advised to resume home medications as noted below. She was requesting doxycycline, and patient discussed this with MD. As per MD noted, "I tried to talk her out of it, but she wants it because of concern for tickborne illness."  MD started Doxycyline  100 mg p.o.BID for 10 days. ketamine infusions were discussed with patient  and MD. MD provided patient with  phone numbers of ketamine clinics near her residence  Patient has been adherent with treatment recommendations. Patient tolerated the medications without any reported side effects are adverse reactions.  Patient was enrolled & participated in the group counseling sessions being offerred & held on this unit. Patient learned coping skills.  Farra is seen today by the attending psychiatrist for discharge. Patient denies any delusions, no hallucinations or other psychotic process. Patient denies active or passive suicidal thoughts. No thoughts of violence. No craving for drugs. Endorses overall improvement in mood emotional state.    Nursing staff reports that patient has been appropriate on the unit. Patient has been interacting well with peers. No behavioral issues. Patient has not voiced any suicidal thoughts. Patient was evaluated by MD prior to discharge. Per MD, patient stable for discharge. Patient was provided with all follow-up information to resume mental health treatment following discharge as noted below.Corinda  was provided with a prescription for her [facility] discharge medications.  Patient left [facility] with all personal belongings in no apparent distress. Transportation per patient/ family arrangement.    Labs: Reviewed and noted as below. Admission labs reviewed- lithium level is subtherapeutic at 0.08  Physical Findings: AIMS:  Facial and Oral Movements Muscles of Facial Expression: None, normal Lips and Perioral Area: None, normal Jaw: None, normal Tongue: None, normal,Extremity Movements Upper (arms, wrists, hands, fingers): None, normal Lower (legs, knees, ankles, toes): None, normal, Trunk Movements Neck, shoulders, hips: None, normal, Overall Severity Severity of abnormal movements (highest score from questions above): None, normal Incapacitation due to abnormal movements: None, normal Patient's awareness of abnormal movements (rate only patient's report): No Awareness, Dental Status Current problems with teeth and/or dentures?: No Does patient usually wear dentures?: No  CIWA:  CIWA-Ar Total: 1 COWS:  COWS Total Score: 1  Musculoskeletal: Strength & Muscle Tone: within normal limits Gait & Station: normal Patient leans: N/A  Psychiatric Specialty Exam: SEE SRA BY MD  Physical Exam  Nursing note and vitals reviewed. Constitutional: She is oriented to person, place, and time.  Neurological: She is alert and oriented to person, place, and time.    Review of Systems  Psychiatric/Behavioral: Negative for hallucinations, memory loss, substance abuse and suicidal ideas. Depression: stable. Nervous/anxious: improved. Insomnia: improved.   All other systems reviewed and are negative.   Blood pressure 96/76, pulse 88, temperature 98.7 F (37.1 C), temperature source Oral, resp. rate 16, height 6' (1.829 m), weight 64.4 kg, SpO2 100 %.Body mass index is 19.26 kg/m.    Have you used any form of tobacco in the last 30 days? (Cigarettes, Smokeless Tobacco, Cigars, and/or Pipes): Patient Refused Screening  Has this patient used any form of tobacco in the last 30 days? (Cigarettes, Smokeless Tobacco, Cigars, and/or Pipes) , N/A Recent Results (from the past 2160 hour(s))  CBC     Status: None   Collection Time: 06/18/18  6:21 PM  Result Value Ref Range   WBC 7.1 4.0 - 10.5 K/uL   RBC 4.33 3.87 - 5.11 MIL/uL    Hemoglobin 13.6 12.0 - 15.0 g/dL   HCT 40.6 36.0 - 46.0 %   MCV 93.8 78.0 - 100.0 fL   MCH 31.4 26.0 - 34.0 pg   MCHC 33.5 30.0 - 36.0 g/dL   RDW 12.6 11.5 - 15.5 %   Platelets 309 150 - 400 K/uL    Comment: Performed at [facility], [place] [address] [place], [place] [address]  Comprehensive metabolic panel     Status: Abnormal   Collection Time: 06/18/18  6:21 PM  Result Value Ref Range   Sodium 138 135 - 145 mmol/L   Potassium 4.2 3.5 - 5.1 mmol/L   Chloride 104 98 - 111 mmol/L   CO2 24 22 - 32 mmol/L   Glucose, Bld 134 (H) 70 - 99 mg/dL   BUN 17 6 - 20 mg/dL   Creatinine, Ser 0.68 0.44 - 1.00 mg/dL   Calcium 9.7 8.9 - 10.3 mg/dL   Total Protein 7.1 6.5 - 8.1 g/dL   Albumin 4.6 3.5 - 5.0 g/dL   AST 20 15 - 41 U/L   ALT 16 0 - 44 U/L   Alkaline Phosphatase 40 38 - 126 U/L   Total Bilirubin 0.6 0.3 - 1.2 mg/dL   GFR calc non Af Amer >60 >60 mL/min   GFR calc Af Amer >60 >60 mL/min    Comment: (NOTE) The eGFR has been calculated using the CKD EPI equation. This calculation has not been validated in all clinical situations. eGFR's persistently <60 mL/min signify possible Chronic Kidney Disease.    Anion gap 10 5 - 15    Comment: Performed at [facility], [place] [address] [place], [place] [address]  Hemoglobin A1c     Status: Abnormal   Collection Time: 06/18/18  6:21 PM  Result Value Ref Range   Hgb A1c MFr Bld 4.7 (L) 4.8 - 5.6 %    Comment: (NOTE) Pre diabetes:          5.7%-6.4% Diabetes:              >6.4% Glycemic control for   <7.0% adults with diabetes    Mean Plasma Glucose 88.19 mg/dL    Comment: Performed at [place] [address] [place], [place] [address]  Lipid panel     Status: Abnormal   Collection Time: 06/18/18  6:21 PM  Result Value Ref Range   Cholesterol 226 (H) 0 - 200 mg/dL   Triglycerides 41 <150 mg/dL   HDL 75 >40 mg/dL   Total CHOL/HDL Ratio 3.0 RATIO   VLDL 8 0 - 40 mg/dL   LDL  Cholesterol 143 (H) 0 - 99 mg/dL    Comment:        Total Cholesterol/HDL:CHD Risk Coronary Heart Disease Risk Table                     Men   Women  1/2 Average Risk   3.4   3.3  Average Risk       5.0   4.4  2 X Average Risk   9.6   7.1  3 X Average Risk  23.4   11.0        Use the calculated Patient Ratio above and the CHD Risk Table to determine the patient's CHD Risk.        ATP III CLASSIFICATION (LDL):  <100     mg/dL   Optimal  100-129  mg/dL   Near or Above                    Optimal  130-159  mg/dL   Borderline  160-189  mg/dL   High  >190     mg/dL   Very High Performed at [facility] [address], [place], [place] [address]   Ethanol     Status: None   Collection Time:  06/18/18  6:21 PM  Result Value Ref Range   Alcohol, Ethyl (B) <10 <10 mg/dL    Comment: (NOTE) Lowest detectable limit for serum alcohol is 10 mg/dL. For medical purposes only. Performed at [facility], [place] [address], [place], [place] [address]   Lithium level     Status: Abnormal   Collection Time: 06/18/18  6:29 PM  Result Value Ref Range   Lithium Lvl 0.08 (L) 0.60 - 1.20 mmol/L    Comment: Performed at [facility], [place] [address], [place], [place] [address]  Pregnancy, urine     Status: None   Collection Time: 06/19/18  6:31 PM  Result Value Ref Range   Preg Test, Ur NEGATIVE NEGATIVE    Comment:        THE SENSITIVITY OF THIS METHODOLOGY IS >20 mIU/mL. Performed at [facility], [place] [address], [place], [place] [address]     Blood Alcohol level:  Lab Results  Component Value Date   ETH <10 21/30/8657    Metabolic Disorder Labs:  Lab Results  Component Value Date   HGBA1C 4.7 (L) 06/18/2018   MPG 88.19 06/18/2018   No results found for: PROLACTIN Lab Results  Component Value Date   CHOL 226 (H) 06/18/2018   TRIG 41 06/18/2018   HDL 75 06/18/2018   CHOLHDL 3.0 06/18/2018   VLDL 8  06/18/2018   LDLCALC 143 (H) 06/18/2018   LDLCALC 98 11/07/2012    See Psychiatric Specialty Exam and Suicide Risk Assessment completed by Attending Physician prior to discharge.  Discharge destination:  Home  Is patient on multiple antipsychotic therapies at discharge:  No   Has Patient had three or more failed trials of antipsychotic monotherapy by history:  No  Recommended Plan for Multiple Antipsychotic Therapies: NA   Allergies as of 06/22/2018      Reactions   Acyclovir And Related    Doxycycline    Fatigue   Lexapro [escitalopram Oxalate] Other (See Comments)   Insomnia   Pristiq [desvenlafaxine]    Dry heaves/dizzy   Prozac [fluoxetine Hcl]    HA and Strange Dreams   Septra Ds [sulfamethoxazole-trimethoprim]    Jitteriness/hyper/affecting sleep   Penicillins Rash   Childhood rash      Medication List    STOP taking these medications   DULoxetine 60 MG capsule Commonly known as:  CYMBALTA   MAGNESIUM PO     TAKE these medications     Indication  acidophilus Caps capsule Take 1 capsule by mouth daily. Start taking on:  06/23/2018  Indication:  probiotic   doxycycline 100 MG tablet Commonly known as:  VIBRA-TABS Take 1 tablet (100 mg total) by mouth 2 (two) times daily for 10 days.  Indication:  prophalactic for tic bite   levothyroxine 25 MCG tablet Commonly known as:  SYNTHROID, LEVOTHROID Take 1 tablet (25 mcg total) by mouth daily before breakfast. Start taking on:  06/23/2018  Indication:  thriod disorder   liothyronine 5 MCG tablet Commonly known as:  CYTOMEL Take 1 tablet (5 mcg total) by mouth daily. Start taking on:  06/23/2018  Indication:  thriod disorder   lithium carbonate 300 MG capsule Take 1 capsule (300 mg total) by mouth at bedtime.  Indication:  mood stabilization   LORazepam 0.5 MG tablet Commonly known as:  ATIVAN Take 0.5 mg by mouth every 8 (eight) hours as needed.  Indication:  anxiety   mirtazapine 15 MG  tablet Commonly known  as:  REMERON Take 1 tablet (15 mg total) by mouth at bedtime.  Indication:  Major Depressive Disorder, insomnia   progesterone 100 MG capsule Commonly known as:  PROMETRIUM Take 300 mg by mouth daily.  Indication:  hormone therapy   valACYclovir 500 MG tablet Commonly known as:  VALTREX Take 500 mg by mouth daily.  Indication:  Herpes Simplex Infection   vortioxetine HBr 10 MG Tabs tablet Commonly known as:  TRINTELLIX Take 1 tablet (10 mg total) by mouth daily. Start taking on:  06/23/2018  Indication:  Major Depressive Disorder      Follow-up Information    [facility]. Go on 06/22/2018.   Specialty:  Behavioral Health Why:  Please attend your intake appt at 2:00pm for Partial Hospitalization Program. Contact information: [place] [phone]          Follow-up recommendations:  Follow up with your outpatient provided for any medical issues. Activity & diet as recommended by your primary care provider.  Comments:  Patient is instructed prior to discharge to: Take all medications as prescribed by his/her mental healthcare provider. Report any adverse effects and or reactions from the medicines to his/her outpatient provider promptly. Patient has been instructed & cautioned: To not engage in alcohol and or illegal drug use while on prescription medicines. In the event of worsening symptoms, patient is instructed to call the crisis hotline, 911 and or go to the nearest ED for appropriate evaluation and treatment of symptoms. To follow-up with his/her primary care provider for your other medical issues, concerns and or health care needs.  Signed: Mordecai Maes, NP 06/22/2018, 10:10 AM

## 2018-06-22 NOTE — Progress Notes (Signed)
Comprehensive Clinical Assessment (CCA) Note  06/22/2018 Autumn Foster [id number]  Visit Diagnosis:      ICD-10-CM   1. MDD (major depressive disorder), recurrent severe, without psychosis ([place]) F33.2       CCA Part One  Part One has been completed on paper by the patient.  (See scanned document in Chart Review)  CCA Part Two A  Intake/Chief Complaint:  CCA Intake With Chief Complaint CCA Part Two Date: 06/22/18 Chief Complaint/Presenting Problem:  Pt. reports severe anxieety and depression Patients Currently Reported Symptoms/Problems: ruminating, shallow breath, tightness in chest and sterum with pain, panicky Collateral Involvement: none  Mental Health Symptoms Depression:  Depression: Change in energy/activity, Difficulty Concentrating, Fatigue, Hopelessness, Increase/decrease in appetite, Tearfulness, Worthlessness, Sleep (too much or little)(poor appetite; sleeps about 4 hours)  Mania:  Mania: N/A  Anxiety:   Anxiety: Worrying, Difficulty concentrating, Fatigue, Restlessness, Sleep, Tension(sleeps until 3am, then restless after 3am)  Psychosis:  Psychosis: N/A  Trauma:  Trauma: N/A  Obsessions:  Obsessions: (thoughts of hopelessness and fear)  Compulsions:  Compulsions: N/A  Inattention:  Inattention: N/A  Hyperactivity/Impulsivity:  Hyperactivity/Impulsivity: N/A  Oppositional/Defiant Behaviors:  Oppositional/Defiant Behaviors: N/A  Borderline Personality:  Emotional Irregularity: Chronic feelings of emptiness  Other Mood/Personality Symptoms:      Mental Status Exam Appearance and self-care  Stature:  Stature: Tall  Weight:  Weight: Average weight  Clothing:  Clothing: Casual  Grooming:  Grooming: Normal  Cosmetic use:  Cosmetic Use: Age appropriate  Posture/gait:  Posture/Gait: Normal  Motor activity:  Motor Activity: Not Remarkable  Sensorium  Attention:  Attention: Normal  Concentration:  Concentration: Normal  Orientation:  Orientation: X5  Recall/memory:   Recall/Memory: Normal  Affect and Mood  Affect:  Affect: Appropriate, Flat  Mood:  Mood: Euthymic  Relating  Eye contact:  Eye Contact: Normal  Facial expression:  Facial Expression: Responsive, Depressed  Attitude toward examiner:  Attitude Toward Examiner: Cooperative  Thought and Language  Speech flow: Speech Flow: Normal  Thought content:  Thought Content: Appropriate to mood and circumstances  Preoccupation:     Hallucinations:     Organization:     Transport planner of Knowledge:  Fund of Knowledge: Average  Intelligence:  Intelligence: Average  Abstraction:  Abstraction: Normal  Judgement:  Judgement: Normal  Reality Testing:  Pension scheme manager, Adequate  Insight:  Insight: Good  Decision Making:  Decision Making: Normal  Social Functioning  Social Maturity:  Social Maturity: Responsible  Social Judgement:  Social Judgement: Normal  Stress  Stressors:  Stressors: Grief/losses, Transitions(loss of children leaving the house, has been stay at home mother and losing sense of purpose)  Coping Ability:  Coping Ability: English as a second language teacher Deficits:     Supports:      Family and Psychosocial History: Family history Marital status: Married Number of Years Married: 26 What types of issues is patient dealing with in the relationship?: none What is your sexual orientation?: heterosexual Does patient have children?: Yes How many children?: 3 How is patient's relationship with their children?: very close; 89, 36, 19 years old  Childhood History:  Childhood History By whom was/is the patient raised?: Both parents Description of patient's relationship with caregiver when they were a child: very close with both parents, supportive "they were negative people, but loved me unconditionally and supportive of me" Patient's description of current relationship with people who raised him/her: mother deceased; father living and has a very close relationship How were you  disciplined when you  got in trouble as a child/adolescent?: few spankings, no abuse Does patient have siblings?: Yes Number of Siblings: 2 Description of patient's current relationship with siblings: very close to both sisters Did patient suffer any verbal/emotional/physical/sexual abuse as a child?: No Did patient suffer from severe childhood neglect?: No Was the patient ever a victim of a crime or a disaster?: No Witnessed domestic violence?: No Has patient been effected by domestic violence as an adult?: No  CCA Part Two B  Employment/Work Situation: Employment / Work Copywriter, 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, stay at home mother) What is the longest time patient has a held a job?: 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 Did You Receive Any Psychiatric Treatment/Services While in the [employer]?: No Are There Guns or Other Weapons in [place]?: Yes Types of Guns/Weapons: husband is the only person who has access to the guns Are These Psychologist, educational?: Yes  Education: Education School Currently Attending: n/a Last Grade Completed: 12 Name of [place]: [place] Did Teacher, adult education From [employer]?: Yes Did [place]?: Yes What Type of College Degree Do you Have?: BS What Was Your Major?: [place] Did You Have Any Special Interests In School?: political science Did You Have An Individualized Education Program (IIEP): No Did You Have Any Difficulty At School?: No  Religion: Religion/Spirituality Are You A Religious Person?: ("somewhat", considers self a spiritual person)  Leisure/Recreation: Leisure / Recreation Leisure and Hobbies: "nothing", likes to go for walk, read, likes to be with family "I'm in serious need for a hobby, laughter, and fun"  Exercise/Diet: Exercise/Diet Do You Exercise?: Yes What Type of Exercise Do You Do?: Run/Walk, Weight Training How Many Times a Week Do You Exercise?: 6-7 times a week Have You Gained or Lost A Significant Amount of  Weight in the Past Six Months?: No Do You Follow a Special Diet?: Yes(modified paleo, not much bread or dairy) Do You Have Any Trouble Sleeping?: Yes Explanation of Sleeping Difficulties: about 4 hours a night; wakes at 3am, restless and unable to return to sleep  CCA Part Two C  Alcohol/Drug Use: Alcohol / Drug Use Pain Medications: none Over the Counter: zyrtec History of alcohol / drug use?: No history of alcohol / drug abuse                      CCA Part Three  ASAM's:  Six Dimensions of Multidimensional Assessment  Dimension 1:  Acute Intoxication and/or Withdrawal Potential:     Dimension 2:  Biomedical Conditions and Complications:     Dimension 3:  Emotional, Behavioral, or Cognitive Conditions and Complications:     Dimension 4:  Readiness to Change:     Dimension 5:  Relapse, Continued use, or Continued Problem Potential:     Dimension 6:  Recovery/Living Environment:      Substance use Disorder (SUD)    Social Function:  Social Functioning Social Maturity: Responsible Social Judgement: Normal  Stress:  Stress Stressors: Grief/losses, Transitions(loss of children leaving the house, has been stay at home mother and losing sense of purpose) Coping Ability: Overwhelmed Patient Takes Medications The Way The Doctor Instructed?: No Priority Risk: Low Acuity  Risk Assessment- Self-Harm Potential: Risk Assessment For Self-Harm Potential Thoughts of Self-Harm: No current thoughts Method: No plan Availability of Means: No access/NA Additional Information for Self-Harm Potential: Family History of Suicide, Preoccupation with Death Additional Comments for Self-Harm Potential: husband walked in and found Pt. with razor blade in hand, but  did not cut self  Risk Assessment -Dangerous to Others Potential: Risk Assessment For Dangerous to Others Potential Method: No Plan Availability of Means: No access or NA Intent: Vague intent or NA Notification Required: No  need or identified person  DSM5 Diagnoses: Patient Active Problem List   Diagnosis Date Noted  . MDD (major depressive disorder), recurrent severe, without psychosis ([place]) 06/18/2018  . Dizziness 04/05/2016  . Orthostatic hypotension 04/05/2016  . POTS (postural orthostatic tachycardia syndrome) 04/05/2016  . Chest pain 06/20/2014  . Palpitations 06/20/2014  . PVC (premature ventricular contraction) 06/20/2014  . Abdominal pain 11/07/2012  . Anxiety 11/07/2012    Patient Centered Plan: Patient is on the following Treatment Plan(s):  Pt. To complete treatment plan in PHP program.  Recommendations for Services/Supports/Treatments: Recommendations for Services/Supports/Treatments Recommendations For Services/Supports/Treatments: Partial Hospitalization  Treatment Plan Summary: Pt. Scheduled to begin PHP on Friday 8/30.    Referrals to Alternative Service(s): Referred to Alternative Service(s):   Place:   Date:   Time:    Referred to Alternative Service(s):   Place:   Date:   Time:    Referred to Alternative Service(s):   Place:   Date:   Time:    Referred to Alternative Service(s):   Place:   Date:   Time:     Nancie Neas

## 2018-06-23 ENCOUNTER — Encounter (HOSPITAL_COMMUNITY): Payer: Self-pay | Admitting: Occupational Therapy

## 2018-06-23 ENCOUNTER — Other Ambulatory Visit: Payer: Self-pay

## 2018-06-23 ENCOUNTER — Other Ambulatory Visit (HOSPITAL_COMMUNITY): Payer: BLUE CROSS/BLUE SHIELD | Attending: Psychiatry | Admitting: Occupational Therapy

## 2018-06-23 ENCOUNTER — Encounter (HOSPITAL_COMMUNITY): Payer: Self-pay

## 2018-06-23 ENCOUNTER — Other Ambulatory Visit (HOSPITAL_COMMUNITY): Payer: BLUE CROSS/BLUE SHIELD | Admitting: Licensed Clinical Social Worker

## 2018-06-23 VITALS — BP 120/62 | HR 70 | Ht 72.0 in | Wt 143.0 lb

## 2018-06-23 DIAGNOSIS — Z881 Allergy status to other antibiotic agents status: Secondary | ICD-10-CM | POA: Insufficient documentation

## 2018-06-23 DIAGNOSIS — Z79899 Other long term (current) drug therapy: Secondary | ICD-10-CM | POA: Insufficient documentation

## 2018-06-23 DIAGNOSIS — R4589 Other symptoms and signs involving emotional state: Secondary | ICD-10-CM

## 2018-06-23 DIAGNOSIS — Z85828 Personal history of other malignant neoplasm of skin: Secondary | ICD-10-CM | POA: Diagnosis not present

## 2018-06-23 DIAGNOSIS — F332 Major depressive disorder, recurrent severe without psychotic features: Secondary | ICD-10-CM | POA: Diagnosis not present

## 2018-06-23 DIAGNOSIS — Z880 Allergy status to penicillin: Secondary | ICD-10-CM | POA: Insufficient documentation

## 2018-06-23 DIAGNOSIS — M8580 Other specified disorders of bone density and structure, unspecified site: Secondary | ICD-10-CM | POA: Diagnosis not present

## 2018-06-23 DIAGNOSIS — F419 Anxiety disorder, unspecified: Secondary | ICD-10-CM | POA: Diagnosis not present

## 2018-06-23 DIAGNOSIS — Z882 Allergy status to sulfonamides status: Secondary | ICD-10-CM | POA: Diagnosis not present

## 2018-06-23 DIAGNOSIS — F070 Personality change due to known physiological condition: Secondary | ICD-10-CM

## 2018-06-23 DIAGNOSIS — Z888 Allergy status to other drugs, medicaments and biological substances status: Secondary | ICD-10-CM | POA: Diagnosis not present

## 2018-06-23 NOTE — Therapy (Signed)
[facility], [place], [address] Phone: [phone]   Fax:  [phone]  Occupational Therapy Evaluation  Patient Details  Name: Autumn Foster MRN: [id number] Date of Birth: 1966/04/29 Referring Provider: Ricky Ala, NP   Encounter Date: 06/23/2018  OT End of Session - 06/23/18 1359    Visit Number  1    Number of Visits  16    Date for OT Re-Evaluation  07/21/18    Authorization Type  BC[employer]    OT Start Time  1030    OT Stop Time  1200    OT Time Calculation (min)  90 min    Activity Tolerance  Patient tolerated treatment well    Behavior During Therapy  [facility] for tasks assessed/performed       Past Medical History:  Diagnosis Date  . Anemia   . Anxiety   . ASCUS on Pap smear   . Bacterial infection   . Bladder spasm   . Blood in stool   . Cancer ([place])    Basal Cell Skin Cancer  . Candida vaginitis   . Cervical dysplasia   . Colon polyp    tubular adenoma  . Depression   . Dizziness 04/05/2016  . Dysmenorrhea   . Endometrial polyp   . Fatigue   . Genital warts   . Hemorrhoids   . Herpes   . History of chicken pox   . HPV (human papilloma virus) infection   . Low blood pressure   . Lyme disease   . Menorrhagia   . Orthostatic hypotension 04/05/2016  . Osteopenia   . Ovarian cyst   . PMDD (premenstrual dysphoric disorder)   . POTS (postural orthostatic tachycardia syndrome) 04/05/2016  . Rectal bleed   . Urinary frequency   . Urine incontinence   . Yeast infection     Past Surgical History:  Procedure Laterality Date  . COLONOSCOPY  12/29/2010   2 sessile polyps Dr. Fuller Plan  . DILATION AND CURETTAGE OF UTERUS    . HYSTEROSCOPY    . NOVASURE ABLATION      There were no vitals filed for this visit.  Subjective Assessment - 06/23/18 1358    Currently in Pain?  No/denies        [facility] OT Assessment - 06/23/18 0001      Assessment   Medical Diagnosis  Major Depressive  Disorder, recurrent severe without psychosis    Referring Provider  Ricky Ala, NP    Onset Date/Surgical Date  06/23/18      Precautions   Precautions  None      Restrictions   Weight Bearing Restrictions  No      Balance Screen   Has the patient fallen in the past 6 months  No    Has the patient had a decrease in activity level because of a fear of falling?   No    Is the patient reluctant to leave their home because of a fear of falling?   No         OT assessment: OCAIRS  Diagnosis: MDD severe with no psychosis  Past medical history/referral information: Pt presents to [facility] after a stay in [facility], noted previous PHP attempt a year prior.  Living situation: Pt lives with husband and 3 adult/adolescent children  ADLs/IADLs: Pt engages in very basic BADL/IADL- wants to engage in more, very lonely   Work: Not working, with strong desire to find  structure and purpose  Leisure: identifies walking and reading, but longs for something more structured  Social support: Husband and family are very supportive  Struggles: riigidity with routine and change, kids are grown/leaving home- feels she lost purpose  OT goal: finding work, Psychologist, educational time, loneliness  [place] Interview Summary of Client Scores:  FACILITATES PARTICIPATION IN [place] RESTRICTS PARTICIPATION IN OCCUPATION COMMENTS  ROLES                 X Extremely dissatisfied   HABITS                 X Extremely dissatisfied   PERSONAL CAUSATION                X  Unsure of future, extreme fear- reports some positive  VALUES               X                  INTERESTS                X    SKILLS                X    SHORT TERM GOALS                X  Loosely identifies few  LONG TERM GOALS                 X   INTERPETATION OF PAST EXPERIENCES                X  Negative based, although claims to have had great life  PHYSICAL  ENVIRONMENT               X   Well equipped with resources  SOCIAL ENVIRONMENT                X  Extremely lonely and isolated  READINESS FOR CHANGE                 X Extremely perturbed with routine change    Need for Occupational Therapy:  4 Shows positive occupational participation, no need for OT.   3 Need for minimal intervention/consultative participation    X 2 Need for OT intervention indicated to restore/improve participation   1 Need for extensive OT intervention indicated to improve participation.  Referral for follow up services also recommended.    Assessment:  Patient demonstrates behavior that inhibits participation in occupation.  Patient will benefit from occupational therapy intervention in order to improve time management, financial management, stress management, job readiness skills, social skills, and health management skills in preparation to return to full time community living and to be a productive community member.   Plan:  Patient will participate in skilled occupational therapy sessions individually or in a group setting to improve coping skills, psychosocial skills, and emotional skills required to return to prior level of function. Treatment will be 4-5 times per week for 3-4 weeks.      OT Treatment- Time management  S: "This is something that really stresses me out"  O: Education received on time management to help increase occupational balance and quality of life. Time management activity given, with teams of 2 listing names of states on blank map in set amount of time. Reflection with group given on strategy, purpose, and feelings on activity. Activities wheel  activity administered with pt to identify hours devoted to: work/obligations, leisure/relaxation, self-care/caregiving, and sleep/rest. Further education given to allow pt to better balance activity wheel for increased occupational balance. Weekly planner handouts given at end of session with education on  different modalities of organization (planner,?apps, computer, to do lists, etc.) so pt could choose preferred avenue. Further education given on the importance of time management in community reintegration from [facility] and how to continue using skills.   A:?Pt presents to group with flat affect, engaged and participatory throughout session. Pt completed time management activity, stating he did not have a strategy and felt pressure due to the time constraint. Pt completed activities wheel, needing maximum VC's to complete. Pt and peers helped brainstorm productive ways for pt to fill time in the future. Pt became overwhelmed and discussion was deferred, letting pt choose one small goal of beginning to learn how to crochet this weekend.  P: Pt provided with education on time management skills to increase occupational balance and quality of life. OT will continue to follow up with pt for successful implementation into daily life.?             OT Education - 06/23/18 1358    Education Details  education given on time management skills to improve routine management and increase community reintegration    Person(s) Educated  Patient    Methods  Explanation;Handout    Comprehension  Verbalized understanding       OT Short Term Goals - 06/23/18 1027      OT SHORT TERM GOAL #1   Title  Patient will be educated on strategies to improve psychosocial skills needed to participate fully in all daily, work, and leisure activities.    Time  4    Period  Weeks    Status  New    Target Date  07/21/18      OT SHORT TERM GOAL #2   Title  Pt will apply psychosoical and coping mechanisms to daily activities in order to function independently when reintegrating into community dwelling      OT [place] #3   Title  Pt will recall 1-3 social opportunities to incorporate into daily routine to improve social participation with community reintegration    Time  4    Period  Weeks    Status  New       OT SHORT TERM GOAL #4   Title  Pt will recall and begin search for 1-3 appropriate part time work opportunities to increase community reintegration and time management    Time  4    Period  [place] - 06/23/18 1026    Occupational performance deficits (Please refer to evaluation for details):  ADL's;IADL's;Rest and Sleep;Work;Leisure;Social Participation    Rehab Potential  Good    OT Frequency  5x / week    OT Duration  4 weeks    OT Treatment/Interventions  Psychosocial skills training;Coping strategies training;Self-care/ADL training;Other (comment)   community reintegration   Consulted and Agree with Plan of Care  Patient       Patient will benefit from skilled therapeutic intervention in order to improve the following deficits and impairments:  Decreased coping skills, Decreased psychosocial skills, Other (comment)(decreased ability to engage in BADL and reintegrate into community)  Visit Diagnosis: Organic personality disorder  Difficulty coping    Problem List Patient Active Problem  List   Diagnosis Date Noted  . MDD (major depressive disorder), recurrent severe, without psychosis ([place]) 06/18/2018  . Dizziness 04/05/2016  . Orthostatic hypotension 04/05/2016  . POTS (postural orthostatic tachycardia syndrome) 04/05/2016  . Chest pain 06/20/2014  . Palpitations 06/20/2014  . PVC (premature ventricular contraction) 06/20/2014  . Abdominal pain 11/07/2012  . Anxiety 11/07/2012   Zenovia Jarred, MSOT, OTR/L  [place] 06/23/2018, 2:01 PM  [facility] PARTIAL HOSPITALIZATION PROGRAM [place], [place], [address] Phone: [phone]   Fax:  [phone]  Name: Autumn Foster MRN: [id number] Date of Birth: 1966-09-30

## 2018-06-23 NOTE — Progress Notes (Signed)
Behavioral Health Partial Program Assessment Note  Date: 06/23/2018 Name: Autumn Foster MRN: [id number]    HPI: Per admission assessment  to inpatient hospitalization year old married female who  presented to the hospital with her husband. Reports history of chronic depression and anxiety which started in 2017. States she had some prior depressive episodes before then but they were generally mild .She has been experiencing suicidal ideations and recently has had thoughts of cutting wrists or hanging herself. States that yesterday she did hold a razor but did not actually cut self. Told her husband, who encouraged her to come to hospital. States her depression is chronic , and she feels she is no longer able to enjoy activities or function at her prior level of functioning due to her depression. States she has been feeling worse  over the last several weeks. She recently had gone to a  a residential program in [place], but stayed only 2-3 days because she did not feel it was a good fit for her, as it was more focused on addiction issues, which she denies having .Of note, states that while she was there Cymbalta was titrated from 30 mgrs  up to 60 mgrs QDAY , but feels this titration has not been helping and has been causing poor appetite  Patient is a 52 y.o. Caucasian female presents with depression and anxiety. Patient validates the information provided in the above assessment. Reports ongoing battle with depressive symptoms. Medication was adjusted during inpatient admission. Patient is currently denying suicidal or homicidal ideation. Support, encouragement and reassurances was provide.   Patient was enrolled in partial psychiatric program on 06/23/18.  Primary complaints include: agitation, anxiety, difficulty sleeping, fearfulness, feeling depressed and feeling suicidal.  Onset of symptoms was gradual with gradually worsening course since that time. Psychosocial Stressors include the following: denies  during this assessment. Reports support family and husband. States she has two sibling who doesn't understand mental illness.   I have reviewed the following documentation dated 06/23/2018: past psychiatric history and past medical history  Complaints of Pain: nonear Past Psychiatric History:  Past psychiatric hospitalizations 3, Previous suicide attempts 2 and Past medication trials per admission assessment.We discussed medication options at length. Patient describes having been on multiple psychiatric medications in the past with limited improvement . Regarding current medications, she states Lithium at higher doses has caused side effects, Cymbalta is not helping and recent titration to 60 mgrs poorly tolerated. Prefers to discontinue this medication.   She is expressing interest in Trintellix , which she has not been on before , and agrees to Remeron, which may help depression and also help address poor sleep and decreased appetite . Agrees to gradual titration of Lithium as an augmentation strategy. Will start medications at lower doses , consider slower titration, based on reported history of poor tolerance to several prior antidepressant trials.  Currently in treatment with Remeron 15 mg, Lithium 300 mg and Ativan 0.5mg    Substance Abuse History: alcohol Use of Alcohol: occasional, social use 2 night weekly Use of Caffeine: denies use Use of over the counter:   Past Surgical History:  Procedure Laterality Date  . COLONOSCOPY  12/29/2010   2 sessile polyps Dr. Fuller Plan  . DILATION AND CURETTAGE OF UTERUS    . HYSTEROSCOPY    . NOVASURE ABLATION      Past Medical History:  Diagnosis Date  . Anemia   . Anxiety   . ASCUS on Pap smear   . Bacterial infection   .  Bladder spasm   . Blood in stool   . Cancer ([place])    Basal Cell Skin Cancer  . Candida vaginitis   . Cervical dysplasia   . Colon polyp    tubular adenoma  . Depression   . Dizziness 04/05/2016  . Dysmenorrhea   .  Endometrial polyp   . Fatigue   . Genital warts   . Hemorrhoids   . Herpes   . History of chicken pox   . HPV (human papilloma virus) infection   . Low blood pressure   . Lyme disease   . Menorrhagia   . Orthostatic hypotension 04/05/2016  . Osteopenia   . Ovarian cyst   . PMDD (premenstrual dysphoric disorder)   . POTS (postural orthostatic tachycardia syndrome) 04/05/2016  . Rectal bleed   . Urinary frequency   . Urine incontinence   . Yeast infection    Outpatient Encounter Medications as of 06/23/2018  Medication Sig Note  . acidophilus (RISAQUAD) CAPS capsule Take 1 capsule by mouth daily.   . cetirizine (ZYRTEC) 10 MG tablet Take 10 mg by mouth daily.   Marland Kitchen doxycycline (VIBRA-TABS) 100 MG tablet Take 1 tablet (100 mg total) by mouth 2 (two) times daily for 10 days.   Marland Kitchen levothyroxine (SYNTHROID, LEVOTHROID) 25 MCG tablet Take 1 tablet (25 mcg total) by mouth daily before breakfast.   . liothyronine (CYTOMEL) 5 MCG tablet Take 1 tablet (5 mcg total) by mouth daily.   Marland Kitchen lithium carbonate 300 MG capsule Take 1 capsule (300 mg total) by mouth at bedtime.   Marland Kitchen LORazepam (ATIVAN) 0.5 MG tablet Take 0.5 mg by mouth every 8 (eight) hours as needed.  06/30/2017: Only as needed.  . mirtazapine (REMERON) 15 MG tablet Take 1 tablet (15 mg total) by mouth at bedtime.   . progesterone (PROMETRIUM) 100 MG capsule Take 300 mg by mouth daily.   . valACYclovir (VALTREX) 500 MG tablet Take 500 mg by mouth daily.   Marland Kitchen vortioxetine HBr (TRINTELLIX) 10 MG TABS tablet Take 1 tablet (10 mg total) by mouth daily.    No facility-administered encounter medications on file as of 06/23/2018.    Allergies  Allergen Reactions  . Acyclovir And Related   . Doxycycline     Fatigue  . Lexapro [Escitalopram Oxalate] Other (See Comments)    Insomnia   . Pristiq [Desvenlafaxine]     Dry heaves/dizzy  . Prozac [Fluoxetine Hcl]     HA and Strange Dreams  . Septra Ds [Sulfamethoxazole-Trimethoprim]      Jitteriness/hyper/affecting sleep  . Penicillins Rash    Childhood rash    Social History   Tobacco Use  . Smoking status: Never Smoker  . Smokeless tobacco: Never Used  Substance Use Topics  . Alcohol use: Yes    Alcohol/week: 0.0 standard drinks    Comment: 1-2 drinks/week   Functioning Relationships: good support system Education: College       Please specify degree:  Other Pertinent History: None Family History  Problem Relation Age of Onset  . Prostate cancer Father   . Hypertension Father   . Renal cancer Father   . Migraines Mother   . Alcohol abuse Mother   . Suicidality Mother        deceased  . Colon cancer Paternal Grandfather   . Dementia Paternal Grandmother   . Thyroid disease Maternal Grandmother   . Rheum arthritis Maternal Grandmother   . Diabetes Maternal Grandfather   . Anxiety disorder Sister  x 2     Review of Systems Constitutional: negative  Objective:  Vitals:   06/23/18 1125  BP: 120/62  Pulse: 70  SpO2: 97%    Physical Exam:   Mental Status Exam: Appearance:  Well groomed Psychomotor::  Within Normal Limits Attention span and concentration: Normal Behavior: calm, cooperative and adequate rapport can be established Speech:  normal volume Mood:  depressed Affect:  normal Thought Process:  Coherent Thought Content:  WDL Orientation:  person, place and situation Cognition:  grossly intact Insight:  Intact Judgment:  Intact Estimate of Intelligence: Average Fund of knowledge: Aware of current events Memory: Recent and remote intact Abnormal movements: None Gait and station: Normal  Assessment:  Diagnosis: MDD (major depressive disorder), recurrent severe, without psychosis ([place]) [F33.2] 1. MDD (major depressive disorder), recurrent severe, without psychosis ([place])     Indications for admission: inpatient care required if not in partial hospital program  Plan: patient enrolled in Partial Hospitalization Program,  patient's current medications are to be continued and a comprehensive treatment plan will be developed Orders placed occupational therapy  Per D/C summary: Emmanuela was medicated & discharged on;  Remeron 15 mgrs QHS, Trintellix 10 mgrs QDAY   Lithium 300 mgrs QDAY for augmentation, Synthroid 25 micrograms QDAY for augmentation.     Treatment options and alternatives reviewed with patient and patient understands the above plan. Treatment plans was reviewed and agreed upon by N.P T.lewis and patient Necha Harries need for group services.      Derrill Center, NP

## 2018-06-23 NOTE — Progress Notes (Signed)
Patient resented with sad affect, depressed mood but denied any current suicidal or homicidal ideations, no auditory or visual hallucinations and no plan or intent to harm self or others. Patient rated her current level of depression a 2-3, anxiety a 2-3 and hopelessness a 1 on a scale of 0-10 with 0 being none and 10 the worst she could manage.  Patient stated she ended up going into the hospital after suicidal ideations worsened to the point her husband found her with a razor blade contemplating cutting herself.  Patient reported she had gotten to the point she did not feel her Cymbalta was helpful any longer and that nothing was working as has tried multiple past antidepressants, Shannon and ECT per her report.  Patient reported her anxiety was worse prior to hospitalization as well but does feel even though she has only been on Trintellex a few days, she does feel improved and depression is less.  Patient stated "I hope its not just the honeymoon phase" but wants to continue current medication regimen at present.  Reports sleep has improved with Remeron but is a little sedated in the mornings.  Patient will continue to monitor this and discussed fall precautions.  Patient also stated she was turned down for Esketamine at her primary psychiatrist's office by her [employer].  Patient to follow up on this if medication assistance is less than targeted to improve her daily stability.  Patient happy with return to [facility] at this time and with medications.  Denies any safety concerns currently and agreed to inform this nurse or PHP staff if any worsening of symptoms or problems with medications.

## 2018-06-27 ENCOUNTER — Other Ambulatory Visit (HOSPITAL_COMMUNITY): Payer: BLUE CROSS/BLUE SHIELD | Attending: Family | Admitting: Licensed Clinical Social Worker

## 2018-06-27 ENCOUNTER — Other Ambulatory Visit (HOSPITAL_COMMUNITY): Payer: Self-pay

## 2018-06-27 ENCOUNTER — Encounter (HOSPITAL_COMMUNITY): Payer: Self-pay | Admitting: Family

## 2018-06-27 ENCOUNTER — Other Ambulatory Visit (HOSPITAL_COMMUNITY): Payer: BLUE CROSS/BLUE SHIELD | Admitting: Occupational Therapy

## 2018-06-27 ENCOUNTER — Encounter (HOSPITAL_COMMUNITY): Payer: Self-pay | Admitting: Occupational Therapy

## 2018-06-27 DIAGNOSIS — Z811 Family history of alcohol abuse and dependence: Secondary | ICD-10-CM | POA: Diagnosis not present

## 2018-06-27 DIAGNOSIS — Z888 Allergy status to other drugs, medicaments and biological substances status: Secondary | ICD-10-CM | POA: Insufficient documentation

## 2018-06-27 DIAGNOSIS — F332 Major depressive disorder, recurrent severe without psychotic features: Secondary | ICD-10-CM | POA: Insufficient documentation

## 2018-06-27 DIAGNOSIS — Z882 Allergy status to sulfonamides status: Secondary | ICD-10-CM | POA: Insufficient documentation

## 2018-06-27 DIAGNOSIS — Z79890 Hormone replacement therapy (postmenopausal): Secondary | ICD-10-CM | POA: Diagnosis not present

## 2018-06-27 DIAGNOSIS — F070 Personality change due to known physiological condition: Secondary | ICD-10-CM

## 2018-06-27 DIAGNOSIS — Z881 Allergy status to other antibiotic agents status: Secondary | ICD-10-CM | POA: Diagnosis not present

## 2018-06-27 DIAGNOSIS — Z79899 Other long term (current) drug therapy: Secondary | ICD-10-CM | POA: Insufficient documentation

## 2018-06-27 DIAGNOSIS — Z9889 Other specified postprocedural states: Secondary | ICD-10-CM | POA: Insufficient documentation

## 2018-06-27 DIAGNOSIS — F419 Anxiety disorder, unspecified: Secondary | ICD-10-CM | POA: Insufficient documentation

## 2018-06-27 DIAGNOSIS — Z818 Family history of other mental and behavioral disorders: Secondary | ICD-10-CM | POA: Insufficient documentation

## 2018-06-27 DIAGNOSIS — R45851 Suicidal ideations: Secondary | ICD-10-CM | POA: Insufficient documentation

## 2018-06-27 DIAGNOSIS — Z833 Family history of diabetes mellitus: Secondary | ICD-10-CM | POA: Diagnosis not present

## 2018-06-27 DIAGNOSIS — R4589 Other symptoms and signs involving emotional state: Secondary | ICD-10-CM

## 2018-06-27 MED ORDER — TRAZODONE HCL 50 MG PO TABS: 25.0000 mg | ORAL_TABLET | Freq: Every day | ORAL | 0 refills | Status: DC

## 2018-06-27 MED ORDER — MIRTAZAPINE 15 MG PO TABS: 30.0000 mg | ORAL_TABLET | Freq: Every day | ORAL | 0 refills | Status: DC

## 2018-06-27 NOTE — Therapy (Signed)
[place], [place], [address] Phone: [phone]   Fax:  [phone]  Occupational Therapy Treatment  Patient Details  Name: Autumn Foster MRN: [id number] Date of Birth: 1966-03-21 Referring Provider: Ricky Ala, NP   Encounter Date: 06/27/2018  OT End of Session - 06/27/18 1359    Visit Number  2    Number of Visits  16    Date for OT Re-Evaluation  07/21/18    Authorization Type  [employer]    OT Start Time  1100    OT Stop Time  1200    OT Time Calculation (min)  60 min    Activity Tolerance  Patient tolerated treatment well    Behavior During Therapy  [facility] for tasks assessed/performed       Past Medical History:  Diagnosis Date  . Anemia   . Anxiety   . ASCUS on Pap smear   . Bacterial infection   . Bladder spasm   . Blood in stool   . Cancer ([place])    Basal Cell Skin Cancer  . Candida vaginitis   . Cervical dysplasia   . Colon polyp    tubular adenoma  . Depression   . Dizziness 04/05/2016  . Dysmenorrhea   . Endometrial polyp   . Fatigue   . Genital warts   . Hemorrhoids   . Herpes   . History of chicken pox   . HPV (human papilloma virus) infection   . Low blood pressure   . Lyme disease   . Menorrhagia   . Orthostatic hypotension 04/05/2016  . Osteopenia   . Ovarian cyst   . PMDD (premenstrual dysphoric disorder)   . POTS (postural orthostatic tachycardia syndrome) 04/05/2016  . Rectal bleed   . Urinary frequency   . Urine incontinence   . Yeast infection     Past Surgical History:  Procedure Laterality Date  . COLONOSCOPY  12/29/2010   2 sessile polyps Dr. Fuller Plan  . DILATION AND CURETTAGE OF UTERUS    . HYSTEROSCOPY    . NOVASURE ABLATION      There were no vitals filed for this visit.  Subjective Assessment - 06/27/18 1359    Currently in Pain?  No/denies       S: "My self esteem is very low because I feel I am so dependent on my husband and could not do this  alone"  O: Education given on definition and importance of positive self-esteem in daily life and relationships with focus on using positive self-talk. Further education given on the relationship between self-esteem and mental illness and both high and low self-esteem factors.?Pt asked to brainstorm probable causes of low self esteem and how to improve this date. Artistic expressive activity completed with negative self talk and positive self talk using a paper bag. Pt to share negative thoughts about oneself (outside of bag) vs positive thoughts with art/quotes/ pictures on the inside of the bag. Pt to share positives and how they are working to implement them into daily life. Peers asked to identify change in affect noted with pt for emotional recognition.   A: Pt presents to group with flat affect, engaged and participatory throughout session. Pt in understanding of self esteem education this date. She shares that most of her difficulty comes from negative thinking patterns she has learned to become habit. Paper bag activity completed, pt constructing own words and quotes of goals to become more positive thinking  and independent. Challenged pt to make plan to change negative thinking, pt agreeable to keeping journal of negative thoughts and challenging them with positives. A peer in group shared the idea of positive affirmation sticky notes on the bathroom miror. Pt willing to trial both ideas this date.  P: Pt provided with self-esteem boosting skills to implement into a variety of daily activities/routines. OT will continue to follow up for successful implementation into daily life.                      OT Education - 06/27/18 1359    Education Details  education given on self esteem and how to improve this date    Person(s) Educated  Patient    Methods  Explanation;Handout    Comprehension  Verbalized understanding       OT Short Term Goals - 06/27/18 1400      OT SHORT  TERM GOAL #1   Title  Patient will be educated on strategies to improve psychosocial skills needed to participate fully in all daily, work, and leisure activities.    Time  4    Period  Weeks    Status  On-going    Target Date  07/21/18      OT SHORT TERM GOAL #2   Title  Pt will apply psychosoical and coping mechanisms to daily activities in order to function independently when reintegrating into community dwelling    Time  3    Period  Weeks    Status  On-going      OT SHORT TERM GOAL #3   Title  Pt will recall 1-3 social opportunities to incorporate into daily routine to improve social participation with community reintegration    Time  4    Period  Weeks    Status  On-going      OT SHORT TERM GOAL #4   Title  Pt will recall and begin search for 1-3 appropriate part time work opportunities to increase community reintegration and time management    Time  4    Period  Weeks    Status  On-going               Plan - 06/27/18 1400    Occupational performance deficits (Please refer to evaluation for details):  ADL's;IADL's;Rest and Sleep;Work;Leisure;Social Participation       Patient will benefit from skilled therapeutic intervention in order to improve the following deficits and impairments:  Decreased coping skills, Decreased psychosocial skills, Other (comment)(decreased ability to engage in BADL and reintegrate into community)  Visit Diagnosis: Organic personality disorder  Difficulty coping    Problem List Patient Active Problem List   Diagnosis Date Noted  . MDD (major depressive disorder), recurrent severe, without psychosis ([place]) 06/18/2018  . Dizziness 04/05/2016  . Orthostatic hypotension 04/05/2016  . POTS (postural orthostatic tachycardia syndrome) 04/05/2016  . Chest pain 06/20/2014  . Palpitations 06/20/2014  . PVC (premature ventricular contraction) 06/20/2014  . Abdominal pain 11/07/2012  . Anxiety 11/07/2012   Zenovia Jarred, MSOT,  OTR/L  [place] 06/27/2018, 2:01 PM  [facility] PARTIAL HOSPITALIZATION PROGRAM [place], [place], [address] Phone: [phone]   Fax:  [phone]  Name: Autumn Foster MRN: [id number] Date of Birth: 1966/07/29

## 2018-06-27 NOTE — Progress Notes (Signed)
BH MD/PA/NP PHP Progress Note  06/27/2018 10:43 AM Autumn Foster  MRN:  [id number]  Chief Complaint: Depression/Anxiety  Autumn Foster observed attending daily group session with active and engaged participation.  Patient was prescribed Remeron 15 mg and Trintellix 10 mg on discharge from inpatient admission.  Patient reports feeling oversedated while taking Remeron 15mg .  States she feels that the medication is helping with her anxiety symptoms.  Reports she was provided 45 mg of Remeron while inpatient and reports feeling restlessness but worsening anxiety.  Patient continues to be apprehensive with medication adjustment.  Reports self  titrating lithium 300 mg to 450 mg over the weekend.  Patient continues to be fixated with medication.  Education was provided with adjusting medications without consulting provider discussed titrating Remeron to 30 mg p.o. Nightly. NP place orders for valproic acid level pending results. patient was agreeable to plan.  Denies suicidal homicidal ideations.  Chart reviewed case staffed with MD Dwyane Dee.  Multiple failed medication trials. denies auditory or visual hallucinations.  Support encouragement and  reassurance was provided   HPI: Per admission assessment  to inpatient hospitalization year old married femalewhopresented to the hospital with her husband. Reports history of chronic depression and anxiety which started in 2017. States she had some prior depressive episodes before then but they were generally mild .She has been experiencing suicidal ideations and recently has had thoughts of cutting wrists or hanging herself. States that yesterday she did hold a razor but did notactuallycut self. Told her husband,who encouraged her to come to hospital. Statesher depression is chronic , and she feels she is no longer able to enjoy activities or function at her prior level of functioning due to her depression. States she has been feeling worseover the last several weeks.  She recently had gone to a a residential program in [place], but stayed only 2-3 days because she did not feel it was a good fit for her, as itwas more focused on addiction issues, which she denies having.Of note, states that while she was there Cymbalta was titrated from 30 mgrs up to 60 mgrs QDAY , but feels this titration has not been helping and has beencausing poor appetite Visit Diagnosis:    ICD-10-CM   1. MDD (major depressive disorder), recurrent severe, without psychosis (Autumn Foster) F33.2     Past Psychiatric History:   Past Medical History:  Past Medical History:  Diagnosis Date  . Anemia   . Anxiety   . ASCUS on Pap smear   . Bacterial infection   . Bladder spasm   . Blood in stool   . Cancer ([place])    Basal Cell Skin Cancer  . Candida vaginitis   . Cervical dysplasia   . Colon polyp    tubular adenoma  . Depression   . Dizziness 04/05/2016  . Dysmenorrhea   . Endometrial polyp   . Fatigue   . Genital warts   . Hemorrhoids   . Herpes   . History of chicken pox   . HPV (human papilloma virus) infection   . Low blood pressure   . Lyme disease   . Menorrhagia   . Orthostatic hypotension 04/05/2016  . Osteopenia   . Ovarian cyst   . PMDD (premenstrual dysphoric disorder)   . POTS (postural orthostatic tachycardia syndrome) 04/05/2016  . Rectal bleed   . Urinary frequency   . Urine incontinence   . Yeast infection     Past Surgical History:  Procedure Laterality Date  . COLONOSCOPY  12/29/2010   2 sessile polyps Dr. Fuller Plan  . DILATION AND CURETTAGE OF UTERUS    . HYSTEROSCOPY    . [place] ABLATION      Family Psychiatric History:   Family History:  Family History  Problem Relation Age of Onset  . Prostate cancer Father   . Hypertension Father   . Renal cancer Father   . Migraines Mother   . Alcohol abuse Mother   . Suicidality Mother        deceased  . Colon cancer Paternal Grandfather   . Dementia Paternal Grandmother   . Thyroid disease Maternal  Grandmother   . Rheum arthritis Maternal Grandmother   . Diabetes Maternal Grandfather   . Anxiety disorder Sister        x 2    Social History:  Social History   Socioeconomic History  . Marital status: Married    Spouse name: Autumn Foster   . Number of children: 3  . Years of education: college  . Highest education level: Not on file  Occupational History  . Not on file  Social Needs  . Financial resource strain: Not hard at all  . Food insecurity:    Worry: Never true    Inability: Never true  . Transportation needs:    Medical: No    Non-medical: No  Tobacco Use  . Smoking status: Never Smoker  . Smokeless tobacco: Never Used  Substance and Sexual Activity  . Alcohol use: Yes    Alcohol/week: 0.0 Foster drinks    Comment: 1-2 drinks/week  . Drug use: No  . Sexual activity: Yes    Partners: Male    Birth control/protection: None    Comment: vasectony  Lifestyle  . Physical activity:    Days per week: 5 days    Minutes per session: 30 min  . Stress: Not at all  Relationships  . Social connections:    Talks on phone: More than three times a week    Gets together: More than three times a week    Attends religious service: More than 4 times per year    Active member of club or organization: No    Attends meetings of clubs or organizations: Never    Relationship status: Married  Other Topics Concern  . Not on file  Social History Narrative   Drinks 1 cup of coffee a day     Allergies:  Allergies  Allergen Reactions  . Acyclovir And Related   . Doxycycline     Fatigue  . Lexapro [Escitalopram Oxalate] Other (See Comments)    Insomnia   . Pristiq [Desvenlafaxine]     Dry heaves/dizzy  . Prozac [Fluoxetine Hcl]     HA and Strange Dreams  . Septra Ds [Sulfamethoxazole-Trimethoprim]     Jitteriness/hyper/affecting sleep  . Penicillins Rash    Childhood rash    Metabolic Disorder Labs: Lab Results  Component Value Date   HGBA1C 4.7 (L) 06/18/2018    MPG 88.19 06/18/2018   No results found for: PROLACTIN Lab Results  Component Value Date   CHOL 226 (H) 06/18/2018   TRIG 41 06/18/2018   HDL 75 06/18/2018   CHOLHDL 3.0 06/18/2018   VLDL 8 06/18/2018   LDLCALC 143 (H) 06/18/2018   LDLCALC 98 11/07/2012   No results found for: TSH  Therapeutic Level Labs: Lab Results  Component Value Date   LITHIUM 0.08 (L) 06/18/2018   No results found for: VALPROATE No components found for:  CBMZ  Current Medications: Current Outpatient Medications  Medication Sig Dispense Refill  . acidophilus (RISAQUAD) CAPS capsule Take 1 capsule by mouth daily. 30 capsule 0  . cetirizine (ZYRTEC) 10 MG tablet Take 10 mg by mouth daily.    Marland Kitchen doxycycline (VIBRA-TABS) 100 MG tablet Take 1 tablet (100 mg total) by mouth 2 (two) times daily for 10 days. 20 tablet 0  . levothyroxine (SYNTHROID, LEVOTHROID) 25 MCG tablet Take 1 tablet (25 mcg total) by mouth daily before breakfast. 30 tablet 0  . liothyronine (CYTOMEL) 5 MCG tablet Take 1 tablet (5 mcg total) by mouth daily. 30 tablet 0  . lithium carbonate 300 MG capsule Take 1 capsule (300 mg total) by mouth at bedtime. 30 capsule 0  . LORazepam (ATIVAN) 0.5 MG tablet Take 0.5 mg by mouth every 8 (eight) hours as needed.     . progesterone (PROMETRIUM) 100 MG capsule Take 300 mg by mouth daily.    . traZODone (DESYREL) 50 MG tablet Take 0.5 tablets (25 mg total) by mouth at bedtime. 10 tablet 0  . valACYclovir (VALTREX) 500 MG tablet Take 500 mg by mouth daily.    Marland Kitchen vortioxetine HBr (TRINTELLIX) 10 MG TABS tablet Take 1 tablet (10 mg total) by mouth daily. 30 tablet 0   No current facility-administered medications for this visit.      Musculoskeletal: Strength & Muscle Tone: within normal limits Gait & Station: normal Patient leans: N/A  Psychiatric Specialty Exam: ROS  There were no vitals taken for this visit.There is no height or weight on file to calculate BMI.  General Appearance: Casual  Eye  Contact:  Fair  Speech:  Clear and Coherent  Volume:  Normal  Mood:  Anxious, Depressed and Irritable  Affect:  Congruent  Thought Process:  Coherent  Orientation:  Full (Time, Place, and Person)  Thought Content: Rumination   Suicidal Thoughts:  No  Homicidal Thoughts:  No  Memory:  Immediate;   Fair Recent;   Fair Remote;   Fair  Judgement:  Fair  Insight:  Fair  Psychomotor Activity:  Normal  Concentration:  Concentration: Fair  Recall:  [employer] of Knowledge: Fair  Language: Fair  Akathisia:  No  Handed:  Right  AIMS (if indicated):   Assets:  Communication Skills Physical Health Resilience Social Support  ADL's:  Intact  Cognition: WNL  Sleep:  Fair   Screenings: AIMS     Admission (Discharged) from OP Visit from 06/18/2018 in [facility]  AIMS Total Score  0    AUDIT     Admission (Discharged) from OP Visit from 06/18/2018 in [facility]  Alcohol Use Disorder Identification Test Final Score (AUDIT)  2    ECT-MADRS     ECT Treatment from 07/18/2017 in [facility] ECT Treatment from 07/11/2017 in [facility] Total Score  21  36    GAD-7     Counselor from 07/04/2017 in [facility] Counselor from 06/29/2017 in [facility]  Total GAD-7 Score  10  18    Mini-Mental     ECT Treatment from 07/18/2017 in [facility] ECT Treatment from 07/11/2017 in [facility]  Total Score (max 30 points )  30  30    PHQ2-9     Counselor from 07/04/2017 in [facility]  Counselor from 06/29/2017 in [facility]  PHQ-2 Total Score  5  6  PHQ-9 Total Score  15  21       Assessment and Plan:  Continue partial hospitalization  program  Depression/Anxiety:  Increased Remeron 15 mg to 30 mg p.o. Nightly  Continue Trintellix 10 mg p.o. Daily  Continue lithium 300 mg p.o. Nightly.    Collected valproic level- pending   Discussed initiating BuSpar however patient reports she wants to continue Remeron only at this time. Treatment team to assist with finding a  different psychiatrist-per patient request   Derrill [place], NP 06/27/2018, 10:43 AM

## 2018-06-28 ENCOUNTER — Other Ambulatory Visit (HOSPITAL_COMMUNITY): Payer: BLUE CROSS/BLUE SHIELD | Admitting: Licensed Clinical Social Worker

## 2018-06-28 DIAGNOSIS — F332 Major depressive disorder, recurrent severe without psychotic features: Secondary | ICD-10-CM

## 2018-06-28 LAB — LITHIUM LEVEL: Lithium Lvl: 0.4 mmol/L — ABNORMAL LOW (ref 0.6–1.2)

## 2018-06-28 NOTE — Psych (Signed)
   [facility] BH PHP THERAPIST PROGRESS NOTE  JAMIE-LEE GALDAMEZ [id number]  Session Time: 9:00 - 10:00  Participation Level: Active  Behavioral Response: CasualAlertDepressed  Type of Therapy: Group Therapy  Treatment Goals addressed: Coping  Interventions: CBT, DBT, Solution Focused, Supportive and Reframing  Summary: SHELISHA GAUTIER is a 52 y.o. female who presents with depression symptoms. Clinician facilitated a group "Check-in". Patient's were allowed to process his/her feelings as it relates to depression, anxiety, lack of sleep, appetite, medication?issues, etc.   Therapist Response: Patient is dressed in appropriate and casual clothing, alert, oriented x4 with normal speech and normal motor behavior. Eye contact is good. Pt's mood is appropriate and affect is congruent with?mood.?Thought process is coherent and relevant. Pt's insight is appropriate and judgment is good. There is no indication Pt is currently responding to internal stimuli or experiencing delusional thought content. Pt was cooperative throughout the group session on this day.?      Session Time: 10:00 - 11:00  Participation Level: Active  Behavioral Response: CasualAlertDepressed  Type of Therapy: Group Therapy; psychotherapy; psychoeducation  Treatment Goals addressed: Coping  Interventions: CBT, DBT, Solution Focused, Supportive and Reframing  Summary: Counselor facilitated a group discussion "Boundaries". Patients were provided a handout as a tool to assist in discussing the topic of boundaries. Patients were provided an education on "Boundaries", which are limits, and rules we set for ourselves within relationships. Clients were informed of the common traits associated with boundaries (rigid, porous, and healthy) boundaries. Clinician asked clients to think about a person, or a group of people, which whom they struggle to set healthy boundaries. Patient was then asked to provide specific actions that may be  taken to improve those boundaries? How do you think the other person will respond to the changes? How do you think your life will be different once you've established healthy boundaries?   Therapist Response:  Clients were allowed to process and group members provided feedback. The?client's?were allowed to share and explore experiences. Clinician was able to process those experiences with the client. The processing session granted clients an opportunity to recognize and ponder on setting healthier boundaries.         Session Time: 11:00 -12:15  Participation Level: Active  Behavioral Response: CasualAlertDepressed  Type of Therapy: Group Therapy, psychotherapy  Treatment Goals addressed: Coping  Interventions: Strengths based, reframing, Supportive  Summary:  OT group  Therapist Response: Patient engaged in group. See occupational therapy note.        Session Time: 12:15 - 1:00  Participation Level: Active  Behavioral Response: CasualAlertDepressed  Type of Therapy: Group Therapy  Treatment Goals addressed: Coping  Interventions: Positive Psychology Training, Supportive  Summary:  Group viewed TED talk "How to practice emotional first aid" and had discussion on ways they can increase the ways they take care of their mental health.  Therapist Response: Patient engaged and participated appropriately. Pt denies SI/HI at end of group.      Suicidal/Homicidal: Nowithout intent/plan   Plan: Pt will continue in PHP while working to decrease depression symptoms and increase ability to manage symptoms when they arise.   Diagnosis: MDD (major depressive disorder), recurrent severe, without psychosis ([place]) [F33.2]    1. MDD (major depressive disorder), recurrent severe, without psychosis ([place])       Waldon Merl, Counselor

## 2018-06-29 ENCOUNTER — Other Ambulatory Visit (HOSPITAL_COMMUNITY): Payer: BLUE CROSS/BLUE SHIELD | Admitting: Occupational Therapy

## 2018-06-29 ENCOUNTER — Other Ambulatory Visit (HOSPITAL_COMMUNITY): Payer: BLUE CROSS/BLUE SHIELD | Admitting: Licensed Clinical Social Worker

## 2018-06-29 ENCOUNTER — Encounter (HOSPITAL_COMMUNITY): Payer: Self-pay | Admitting: Occupational Therapy

## 2018-06-29 VITALS — BP 98/68 | HR 66 | Ht 72.0 in | Wt 144.0 lb

## 2018-06-29 DIAGNOSIS — F332 Major depressive disorder, recurrent severe without psychotic features: Secondary | ICD-10-CM

## 2018-06-29 DIAGNOSIS — F070 Personality change due to known physiological condition: Secondary | ICD-10-CM

## 2018-06-29 DIAGNOSIS — R4589 Other symptoms and signs involving emotional state: Secondary | ICD-10-CM

## 2018-06-29 NOTE — Therapy (Signed)
[facility] [place], [place], [address] Phone: [phone]   Fax:  [phone]  Occupational Therapy Treatment  Patient Details  Name: Autumn Foster MRN: [id number] Date of Birth: Mar 23, 1966 Referring Provider: Ricky Ala, NP   Encounter Date: 06/29/2018  OT End of Session - 06/29/18 1248    Visit Number  3    Number of Visits  16    Date for OT Re-Evaluation  07/21/18    Authorization Type  [employer]    OT Start Time  1100    OT Stop Time  1200    OT Time Calculation (min)  60 min    Activity Tolerance  Patient tolerated treatment well    Behavior During Therapy  [facility] for tasks assessed/performed       Past Medical History:  Diagnosis Date  . Anemia   . Anxiety   . ASCUS on Pap smear   . Bacterial infection   . Bladder spasm   . Blood in stool   . Cancer ([place])    Basal Cell Skin Cancer  . Candida vaginitis   . Cervical dysplasia   . Colon polyp    tubular adenoma  . Depression   . Dizziness 04/05/2016  . Dysmenorrhea   . Endometrial polyp   . Fatigue   . Genital warts   . Hemorrhoids   . Herpes   . History of chicken pox   . HPV (human papilloma virus) infection   . Low blood pressure   . Lyme disease   . Menorrhagia   . Orthostatic hypotension 04/05/2016  . Osteopenia   . Ovarian cyst   . PMDD (premenstrual dysphoric disorder)   . POTS (postural orthostatic tachycardia syndrome) 04/05/2016  . Rectal bleed   . Urinary frequency   . Urine incontinence   . Yeast infection     Past Surgical History:  Procedure Laterality Date  . COLONOSCOPY  12/29/2010   2 sessile polyps Dr. Fuller Plan  . DILATION AND CURETTAGE OF UTERUS    . HYSTEROSCOPY    . NOVAS[place] ABLATION      There were no vitals filed for this visit.  Subjective Assessment - 06/29/18 1247    Currently in Pain?  No/denies        S: "When it comes to purpose, I really feel as if I have none after my kids left the house"   O: Education given on protective factors and their importance in building resiliency to face difficult life challenges. Protective factors worksheet completed. Pt to rate current protective factors of social support, coping skills, physical health, sense of purpose, self-esteem, and healthy thinking on a scale from weak-moderate-strong. Pt then to identify the most valuable protective factor, 2 protective factors to improve, and specific goals to accomplish this task- to be completed next week, not entirely finished this date.  A: Pt presents to treatment with flat affect, engaged and participatory throughout session. Protective factors worksheet completed, pt sharing she is she is moderate-weak in the areas of physical health, social support, coping skills, sense of purpose, self esteem, and healthy thinking. Pt shares sense of purpose is her lowest area that she would like to focus on. She has goals of engaging in part time work or volunteering for purpose. Further goals and plan to be identified next date.  P: Education given on protective factors this date. OT will continue to follow up for successful implementation of skill.  OT Education - 06/29/18 1248    Education Details  education given on protective factors and how they relate to resiliency and mental health conditions    Person(s) Educated  Patient    Methods  Explanation;Handout    Comprehension  Verbalized understanding       OT Short Term Goals - 06/27/18 1400      OT SHORT TERM GOAL #1   Title  Patient will be educated on strategies to improve psychosocial skills needed to participate fully in all daily, work, and leisure activities.    Time  4    Period  Weeks    Status  On-going    Target Date  07/21/18      OT SHORT TERM GOAL #2   Title  Pt will apply psychosoical and coping mechanisms to daily activities in order to function independently when reintegrating into community dwelling    Time   3    Period  Weeks    Status  On-going      OT SHORT TERM GOAL #3   Title  Pt will recall 1-3 social opportunities to incorporate into daily routine to improve social participation with community reintegration    Time  4    Period  Weeks    Status  On-going      OT SHORT TERM GOAL #4   Title  Pt will recall and begin search for 1-3 appropriate part time work opportunities to increase community reintegration and time management    Time  4    Period  Weeks    Status  On-going               Plan - 06/29/18 1249    Occupational performance deficits (Please refer to evaluation for details):  ADL's;IADL's;Rest and Sleep;Work;Leisure;Social Participation       Patient will benefit from skilled therapeutic intervention in order to improve the following deficits and impairments:  Decreased coping skills, Decreased psychosocial skills, Other (comment)(decreased ability to engage in BADL and reintegrate into community)  Visit Diagnosis: Organic personality disorder  Difficulty coping    Problem List Patient Active Problem List   Diagnosis Date Noted  . MDD (major depressive disorder), recurrent severe, without psychosis ([place]) 06/18/2018  . Dizziness 04/05/2016  . Orthostatic hypotension 04/05/2016  . POTS (postural orthostatic tachycardia syndrome) 04/05/2016  . Chest pain 06/20/2014  . Palpitations 06/20/2014  . PVC (premature ventricular contraction) 06/20/2014  . Abdominal pain 11/07/2012  . Anxiety 11/07/2012   Zenovia Jarred, MSOT, OTR/L  [place] 06/29/2018, 12:50 PM  [facility] PARTIAL HOSPITALIZATION PROGRAM [place], [place], [address] Phone: [phone]   Fax:  [phone]  Name: Autumn Foster MRN: [id number] Date of Birth: [birth year]

## 2018-06-29 NOTE — Progress Notes (Signed)
Patient presents with appropriate affect, more level mood and denied any suicidal or homicidal ideations, no auditory or visual hallucinations and no plans or intent to harm self or others.  Patient rated her current level of depression a 1, anxiety a 1 and hopelessness a 1 on a scale of 0-10 with 0 being none and 10 the worst she could manage.  Patient reported primary complaint and side effects to medication as constipation, "grogginess", and a noticed slight tremor in her hands at times.  Denies tremor today and reports going up on Remeron has helped some but still does feel "groggy" most of day.  States her sleep has improved but denies any history of having problems with constipation but now this is becoming an issue as well.  Patient stated Ricky Ala, NP had discussed trying Trazodone for sleep in place of Remeron and patient is still contemplating this trial.  Patient reported she was just afraid to change too much as her anxiety has greatly improved but will discuss further with NP.  Patient reported overall Trintellex and Lithium have helped mood and her depression is much less.  Patient considering seeing Dr. Daron Offer upon discharge from [facility] and no other concerns expressed at this time.  Patient agreed to contact this nurse or PHP staff if any worsening of symptoms and will follow-up with NP to discuss sleep and increased constipation concerns.  Reports plan to take Miralax OTC to see if helps with constipation.

## 2018-06-30 ENCOUNTER — Other Ambulatory Visit (HOSPITAL_COMMUNITY): Payer: BLUE CROSS/BLUE SHIELD | Admitting: Licensed Clinical Social Worker

## 2018-06-30 ENCOUNTER — Encounter (HOSPITAL_COMMUNITY): Payer: Self-pay | Admitting: Occupational Therapy

## 2018-06-30 ENCOUNTER — Other Ambulatory Visit (HOSPITAL_COMMUNITY): Payer: BLUE CROSS/BLUE SHIELD | Admitting: Occupational Therapy

## 2018-06-30 DIAGNOSIS — F332 Major depressive disorder, recurrent severe without psychotic features: Secondary | ICD-10-CM | POA: Diagnosis not present

## 2018-06-30 DIAGNOSIS — R4589 Other symptoms and signs involving emotional state: Secondary | ICD-10-CM

## 2018-06-30 DIAGNOSIS — F070 Personality change due to known physiological condition: Secondary | ICD-10-CM

## 2018-06-30 NOTE — Psych (Signed)
   [facility] BH PHP THERAPIST PROGRESS NOTE  [name] [id number]  Session Time: 9:00 - 11:00  Participation Level: Active  Behavioral Response: CasualAlertDepressed  Type of Therapy: Group Therapy  Treatment Goals addressed: Coping  Interventions: CBT, DBT, Solution Focused, Supportive and Reframing  Summary:  Clinician led check-in regarding current stressors and situation, and review of patient completed daily inventory. Clinician utilized active listening and empathetic response and validated patient emotions. Clinician facilitated processing group on pertinent issues.   Therapist Response: Autumn Foster is a 52 y.o. female who presents with depression symptoms. Patient arrived within time allowed and reports that she is feeling "pretty good." Patient rates her mood at a 8 on a scale of 1-10 with 10 being great. Pt states issues with medication as she is feeling "so drowsy" that she believes is due to a medication side effect. Pt reports struggling because she feels the medicine has helped her anxiety but she does not like this possible side effect. Pt is encouraged to not be reactive and wait to see if this alleviates before demanding a change. Pt states she is starting to have a "glimmer" of hope and is "so much better than 1 week ago."   Patient continues to struggle with negative rumination and judgment about the rumination. Patient engaged in discussion.      Session Time: 11:00 -12:00   Participation Level: Active   Behavioral Response: CasualAlertDepressed   Type of Therapy: Group Therapy, OT   Treatment Goals addressed: Coping   Interventions: Psychosocial skills training, Supportive,    Summary:  Occupational Therapy group   Therapist Response: Patient engaged in group. See OT note.          Session Time: 12:00 - 12:45  Participation Level: Active  Behavioral Response: CasualAlertDepressed  Type of Therapy: Group Therapy, Activity Therapy  Treatment  Goals addressed: Coping  Interventions: Systems analyst, Supportive  Summary:  Reflection Group: Patients encouraged to practice skills and interpersonal techniques or work on mindfulness and relaxation techniques. The importance of self-care and making skills part of a routine to increase usage were stressed   Therapist Response: Patient engaged and participated appropriately.          Session Time: 12:45 - 2:00   Participation Level: Active   Behavioral Response: CasualAlertDepressed   Type of Therapy: Group Therapy, Psychotherapy   Treatment Goals addressed: Coping   Interventions: CBT, Solution focused, Supportive, Reframing   Summary:  Cln continued discussion on distress tolerance. Cln introduced ACCEPTS skills. Group discussed "A" activities and "C" contributions and how they can utilize them.     Therapist Response: Patient engaged in group. Pt seemed to struggle with the concept of distraction as a tool and states wanting the "root of the issue gone." Cln attempted to reframe that management is the goal, not absence of any symptoms.       Suicidal/Homicidal: Nowithout intent/plan   Plan: Pt will continue in PHP while working to decrease depression symptoms and increase ability to manage symptoms when they arise.   Diagnosis: MDD (major depressive disorder), recurrent severe, without psychosis ([place]) [F33.2]    1. MDD (major depressive disorder), recurrent severe, without psychosis ([place])       Lorin Glass, LCSW 06/30/18

## 2018-06-30 NOTE — Therapy (Signed)
[place], [place], [address] Phone: [phone]   Fax:  [phone]  Occupational Therapy Treatment  Patient Details  Name: Autumn Foster MRN: [id number] Date of Birth: 1966-09-27 Referring Provider: Ricky Ala, NP   Encounter Date: 06/30/2018  OT End of Session - 06/30/18 1245    Visit Number  4    Number of Visits  16    Date for OT Re-Evaluation  07/21/18    Authorization Type  BCBS    OT Start Time  1100    OT Stop Time  1200    OT Time Calculation (min)  60 min    Activity Tolerance  Patient tolerated treatment well    Behavior During Therapy  [facility] for tasks assessed/performed       Past Medical History:  Diagnosis Date  . Anemia   . Anxiety   . ASCUS on Pap smear   . Bacterial infection   . Bladder spasm   . Blood in stool   . Cancer ([place])    Basal Cell Skin Cancer  . Candida vaginitis   . Cervical dysplasia   . Colon polyp    tubular adenoma  . Depression   . Dizziness 04/05/2016  . Dysmenorrhea   . Endometrial polyp   . Fatigue   . Genital warts   . Hemorrhoids   . Herpes   . History of chicken pox   . HPV (human papilloma virus) infection   . Low blood pressure   . Lyme disease   . Menorrhagia   . Orthostatic hypotension 04/05/2016  . Osteopenia   . Ovarian cyst   . PMDD (premenstrual dysphoric disorder)   . POTS (postural orthostatic tachycardia syndrome) 04/05/2016  . Rectal bleed   . Urinary frequency   . Urine incontinence   . Yeast infection     Past Surgical History:  Procedure Laterality Date  . COLONOSCOPY  12/29/2010   2 sessile polyps Dr. Fuller Plan  . DILATION AND CURETTAGE OF UTERUS    . HYSTEROSCOPY    . NOVASURE ABLATION      There were no vitals filed for this visit.  Subjective Assessment - 06/30/18 1244    Currently in Pain?  No/denies        S: "I really want to work on my sense of purpose and healthy thinking"  O:  Continued education  given on protective factors (social support, coping skills, physical health, sense of purpose, self esteem, healthy thinking). Protective factors worksheet given last treatment session and completed this date. Pt to identify most valuable protective factor, 2 protective factors to improve, and identified goals to improve this date. Pt encouraged to engage in brainstorming with other peers to pick out best skills/ideas to implement this date.  A: Pt presents to group with blunted affect, engaged and participatory throughout session. Pt completed remainder of protective factors worksheet, identifying that social support has been the most valuable in the past. She would like to improve her sense of purpose and healthy thinking this date. She plans to improve her sense of purpose by involving herself in either meaningful paid/volunteer work and has been actively exploring options. Pt also brainstormed with others about social opportunities and plans to use an app to meet people based off of common interest. She plans to improve her healthy thinking by continuing to practice coping skills to manage ruminations. She also has committed to taking pictures of her coping skills  to keep on her phone for more easy access when in the community.  P: Remainder of education given on protective factors. OT will follow up to ensure successful implementation of identified goals.                  OT Education - 06/30/18 1244    Education Details  continued education given on protective factors and how they relate to mental health    Person(s) Educated  Patient    Methods  Explanation;Handout    Comprehension  Verbalized understanding       OT Short Term Goals - 06/27/18 1400      OT SHORT TERM GOAL #1   Title  Patient will be educated on strategies to improve psychosocial skills needed to participate fully in all daily, work, and leisure activities.    Time  4    Period  Weeks    Status  On-going     Target Date  07/21/18      OT SHORT TERM GOAL #2   Title  Pt will apply psychosoical and coping mechanisms to daily activities in order to function independently when reintegrating into community dwelling    Time  3    Period  Weeks    Status  On-going      OT SHORT TERM GOAL #3   Title  Pt will recall 1-3 social opportunities to incorporate into daily routine to improve social participation with community reintegration    Time  4    Period  Weeks    Status  On-going      OT SHORT TERM GOAL #4   Title  Pt will recall and begin search for 1-3 appropriate part time work opportunities to increase community reintegration and time management    Time  4    Period  Weeks    Status  On-going               Plan - 06/30/18 1245    Occupational performance deficits (Please refer to evaluation for details):  ADL's;IADL's;Rest and Sleep;Work;Leisure;Social Participation       Patient will benefit from skilled therapeutic intervention in order to improve the following deficits and impairments:  Decreased coping skills, Decreased psychosocial skills, Other (comment)(decreased ability to engage in BADL and reintegrate into community)  Visit Diagnosis: Organic personality disorder  Difficulty coping    Problem List Patient Active Problem List   Diagnosis Date Noted  . MDD (major depressive disorder), recurrent severe, without psychosis ([place]) 06/18/2018  . Dizziness 04/05/2016  . Orthostatic hypotension 04/05/2016  . POTS (postural orthostatic tachycardia syndrome) 04/05/2016  . Chest pain 06/20/2014  . Palpitations 06/20/2014  . PVC (premature ventricular contraction) 06/20/2014  . Abdominal pain 11/07/2012  . Anxiety 11/07/2012   Zenovia Jarred, MSOT, OTR/L  [place] 06/30/2018, 12:48 PM  [facility] PARTIAL HOSPITALIZATION PROGRAM [place], [place], [address] Phone: [phone]   Fax:  [phone]  Name: Autumn Foster MRN: [id number] Date of Birth: 12/21/1965

## 2018-07-03 ENCOUNTER — Other Ambulatory Visit (HOSPITAL_COMMUNITY): Payer: BLUE CROSS/BLUE SHIELD | Admitting: Licensed Clinical Social Worker

## 2018-07-03 ENCOUNTER — Encounter (HOSPITAL_COMMUNITY): Payer: Self-pay | Admitting: Occupational Therapy

## 2018-07-03 ENCOUNTER — Other Ambulatory Visit (HOSPITAL_COMMUNITY): Payer: BLUE CROSS/BLUE SHIELD | Admitting: Occupational Therapy

## 2018-07-03 DIAGNOSIS — F332 Major depressive disorder, recurrent severe without psychotic features: Secondary | ICD-10-CM | POA: Diagnosis not present

## 2018-07-03 DIAGNOSIS — R4589 Other symptoms and signs involving emotional state: Secondary | ICD-10-CM

## 2018-07-03 DIAGNOSIS — F070 Personality change due to known physiological condition: Secondary | ICD-10-CM

## 2018-07-03 NOTE — Therapy (Signed)
[place], [place], [address] Phone: [phone]   Fax:  [phone]  Occupational Therapy Treatment  Patient Details  Name: Autumn Foster MRN: [id number] Date of Birth: 1966/09/07 Referring Provider: Ricky Ala, NP   Encounter Date: 07/03/2018  OT End of Session - 07/03/18 1353    Visit Number  5    Number of Visits  16    Date for OT Re-Evaluation  07/21/18    Authorization Type  BCBS    OT Start Time  1100    OT Stop Time  1200    OT Time Calculation (min)  60 min    Activity Tolerance  Patient tolerated treatment well    Behavior During Therapy  [facility] for tasks assessed/performed       Past Medical History:  Diagnosis Date  . Anemia   . Anxiety   . ASCUS on Pap smear   . Bacterial infection   . Bladder spasm   . Blood in stool   . Cancer ([place])    Basal Cell Skin Cancer  . Candida vaginitis   . Cervical dysplasia   . Colon polyp    tubular adenoma  . Depression   . Dizziness 04/05/2016  . Dysmenorrhea   . Endometrial polyp   . Fatigue   . Genital warts   . Hemorrhoids   . Herpes   . History of chicken pox   . HPV (human papilloma virus) infection   . Low blood pressure   . Lyme disease   . Menorrhagia   . Orthostatic hypotension 04/05/2016  . Osteopenia   . Ovarian cyst   . PMDD (premenstrual dysphoric disorder)   . POTS (postural orthostatic tachycardia syndrome) 04/05/2016  . Rectal bleed   . Urinary frequency   . Urine incontinence   . Yeast infection     Past Surgical History:  Procedure Laterality Date  . COLONOSCOPY  12/29/2010   2 sessile polyps Dr. Fuller Plan  . DILATION AND CURETTAGE OF UTERUS    . HYSTEROSCOPY    . NOVASURE ABLATION      There were no vitals filed for this visit.  Subjective Assessment - 07/03/18 1353    Currently in Pain?  No/denies         S: "I need to find leisure, nothing is really fun anymore"  O: Education further given on  self-accountability being in line with personal values and goals to maintain occupational balance in various community settings. Pt to identify values on worksheet and use them to derive meaningful goals. Pt  to list immediate, short term, medium term, and long-term goals using a SMART goal framework (specificity, meaningful, adaptive, realistic, and time bound). Goals created as guideline for pt to practice being accountable in various situations. Pt completed work sheet of goals and encouraged to share goals with the group, with emphasis on immediate goal for check in with pt for next session to maintain accountability.   A: Pt presents to group with blunted affect, engaged and participatory throughout entirety of session. Pt identified values of leisure, work, and personal growth to improve this date. Pt derived goals of cooking healthy meals, walking in the evening, meditating, and finding meaningful volunteer work. Pt sharing thoughts and ideas with other group members. Pt needing moderate VC's to complete SMART goal framework. Pt voicing increased anxiety when completing long term goal, needing total VC's and encouragement to formulate appropriate goal.    P:  OT will continue to follow up with pt to ensure successful implementation of meaningful goals into daily life.            OT Education - 07/03/18 1353    Education Details  education given on how to construct goals from personal values    Person(s) Educated  Patient    Methods  Explanation;Handout    Comprehension  Verbalized understanding       OT Short Term Goals - 06/27/18 1400      OT SHORT TERM GOAL #1   Title  Patient will be educated on strategies to improve psychosocial skills needed to participate fully in all daily, work, and leisure activities.    Time  4    Period  Weeks    Status  On-going    Target Date  07/21/18      OT SHORT TERM GOAL #2   Title  Pt will apply psychosoical and coping mechanisms to daily  activities in order to function independently when reintegrating into community dwelling    Time  3    Period  Weeks    Status  On-going      OT SHORT TERM GOAL #3   Title  Pt will recall 1-3 social opportunities to incorporate into daily routine to improve social participation with community reintegration    Time  4    Period  Weeks    Status  On-going      OT SHORT TERM GOAL #4   Title  Pt will recall and begin search for 1-3 appropriate part time work opportunities to increase community reintegration and time management    Time  4    Period  Weeks    Status  On-going               Plan - 07/03/18 1354    Occupational performance deficits (Please refer to evaluation for details):  ADL's;IADL's;Rest and Sleep;Work;Leisure;Social Participation       Patient will benefit from skilled therapeutic intervention in order to improve the following deficits and impairments:  Decreased coping skills, Decreased psychosocial skills, Other (comment)(decreased ability to engage in BADL and reintegrate into community)  Visit Diagnosis: Organic personality disorder  Difficulty coping    Problem List Patient Active Problem List   Diagnosis Date Noted  . MDD (major depressive disorder), recurrent severe, without psychosis ([place]) 06/18/2018  . Dizziness 04/05/2016  . Orthostatic hypotension 04/05/2016  . POTS (postural orthostatic tachycardia syndrome) 04/05/2016  . Chest pain 06/20/2014  . Palpitations 06/20/2014  . PVC (premature ventricular contraction) 06/20/2014  . Abdominal pain 11/07/2012  . Anxiety 11/07/2012   Zenovia Jarred, MSOT, OTR/L  [place] 07/03/2018, 1:54 PM  [facility] PARTIAL HOSPITALIZATION PROGRAM [place], [place], [address] Phone: [phone]   Fax:  [phone]  Name: Autumn Foster MRN: [id number] Date of Birth: [birth year]

## 2018-07-04 ENCOUNTER — Other Ambulatory Visit (HOSPITAL_COMMUNITY): Payer: BLUE CROSS/BLUE SHIELD | Admitting: Occupational Therapy

## 2018-07-04 ENCOUNTER — Encounter (HOSPITAL_COMMUNITY): Payer: Self-pay

## 2018-07-04 ENCOUNTER — Other Ambulatory Visit (HOSPITAL_COMMUNITY): Payer: BLUE CROSS/BLUE SHIELD | Admitting: Licensed Clinical Social Worker

## 2018-07-04 ENCOUNTER — Encounter (HOSPITAL_COMMUNITY): Payer: Self-pay | Admitting: Occupational Therapy

## 2018-07-04 VITALS — BP 88/60 | HR 73 | Ht 72.0 in | Wt 145.0 lb

## 2018-07-04 DIAGNOSIS — F332 Major depressive disorder, recurrent severe without psychotic features: Secondary | ICD-10-CM

## 2018-07-04 DIAGNOSIS — R4589 Other symptoms and signs involving emotional state: Secondary | ICD-10-CM

## 2018-07-04 DIAGNOSIS — F070 Personality change due to known physiological condition: Secondary | ICD-10-CM

## 2018-07-04 NOTE — Progress Notes (Signed)
Patient presented with sad affect, depressed mood and reported today had not been a great day.  Patient admitted to feeling pressure from family to make a decision about taking a potential part-time job of 20 hours a week to begin once PHP ended.  Patient stated she had not worked in 50 years and would like to try the position to "have something to do" but admits to increased anxiety and concern she just will not be able to manage it currently.  Patient reported feeling groggy still at times with Remeron, sleeping better but still tired during the day.  Patient reported her current level of depression at a 5, anxiety a 7, and hopelessness a 6-7 on a scale of 0-10 with 0 being none and 10 the worst she could manage.  Patient stated she was really getting tired of just "having a mental illness" and at times is afraid she may not improve.  Patient stated she was feeling better initially with current medications but that anxiety has worsened over the last few days.  Patient discussed her status with Mother's past suicide and that she has now forgiven her for doing that as thinks about would her children be affected if she ever did this as well.  Patient again denied any current suicidal or homicidal ideations with no plan or intent to harm self or others.  Patient to see MD on tomorrow and agreed with plans to discuss other possible options for sleep and if needs to check any lithium levels.  Patient reported she will need a new prescription for Lithium but has enough until returns to see provider. Patient stated plan to go out and see 74 year old son today and to do some things with him to help with mood.  Patient reported continued desire to feel better and discussed possible other treatment options for depression.  Patient to see MD on tomorrow and reported she would be safe until appointment and will continue to take medications as prescribed until evaluation.  Patient left with some noted improvement in affect and  agreed to contact this nurse or PHP staff if any worsening or symptoms or begins to plan how she would harm self or others.

## 2018-07-04 NOTE — Therapy (Signed)
[place], [place], [address] Phone: [phone]   Fax:  [phone]  Occupational Therapy Treatment  Patient Details  Name: Autumn Foster MRN: [id number] Date of Birth: 11/04/1965 Referring Provider: Ricky Ala, NP   Encounter Date: 07/04/2018  OT End of Session - 07/04/18 1327    Visit Number  6    Number of Visits  16    Date for OT Re-Evaluation  07/21/18    Authorization Type  BCBS    OT Start Time  1100    OT Stop Time  1200    OT Time Calculation (min)  60 min    Activity Tolerance  Patient tolerated treatment well    Behavior During Therapy  [facility] for tasks assessed/performed       Past Medical History:  Diagnosis Date  . Anemia   . Anxiety   . ASCUS on Pap smear   . Bacterial infection   . Bladder spasm   . Blood in stool   . Cancer ([place])    Basal Cell Skin Cancer  . Candida vaginitis   . Cervical dysplasia   . Colon polyp    tubular adenoma  . Depression   . Dizziness 04/05/2016  . Dysmenorrhea   . Endometrial polyp   . Fatigue   . Genital warts   . Hemorrhoids   . Herpes   . History of chicken pox   . HPV (human papilloma virus) infection   . Low blood pressure   . Lyme disease   . Menorrhagia   . Orthostatic hypotension 04/05/2016  . Osteopenia   . Ovarian cyst   . PMDD (premenstrual dysphoric disorder)   . POTS (postural orthostatic tachycardia syndrome) 04/05/2016  . Rectal bleed   . Urinary frequency   . Urine incontinence   . Yeast infection     Past Surgical History:  Procedure Laterality Date  . COLONOSCOPY  12/29/2010   2 sessile polyps Dr. Fuller Plan  . DILATION AND CURETTAGE OF UTERUS    . HYSTEROSCOPY    . NOVASURE ABLATION      There were no vitals filed for this visit.  Subjective Assessment - 07/04/18 1327    Currently in Pain?  No/denies       S: "Nothing I am finding brings me any joy"  O: In relation to group focus on goal setting from  previous date, pt asked to construct vision board. Art therapy used as therapeutic intervention to create vision boards, in turn to also to also help pts positively depict the future, with goals and traits they find admirable/desirable within the self. Music used to facilitate additional relaxation response when engaging in artistic expression activity. Pt instructed to share with other group members thoughts/goals when creating vision boards. Pt also given positive affirmations worksheet, to make 10 affirmations on their own volition.   A: Pt presents to group with blunted affect, engaged and participatory throughout entirety of session. Pt needing moderate VC's and encouragement to complete activity, with some self seeking behaviors. Pt closing eyes and putting hands over face, becoming tearful stating nothing she finds in the magazine photos bring her joy. Pt created vision board with mod-max verbal cues, using quotes and minimal pictures from magazines which related to positive affirmations pt used from worksheet. Pt hesitant to share end product, with many negative thoughts surrounding the idea of the activity. Pt with goals/affirmations on a focus of positivity. Pt still appearing  frustrated at end of activity, goals loosely displayed by activity.   P: Art therapy completed this date with a focus on goals/positive affirmations. OT will continue to follow up to ensure successful implementation in daily life.                    OT Education - 07/04/18 1327    Education Details  education given on positive affirmations and using art therapy to display goals    Person(s) Educated  Patient    Methods  Explanation;Handout    Comprehension  Verbalized understanding       OT Short Term Goals - 06/27/18 1400      OT SHORT TERM GOAL #1   Title  Patient will be educated on strategies to improve psychosocial skills needed to participate fully in all daily, work, and leisure activities.     Time  4    Period  Weeks    Status  On-going    Target Date  07/21/18      OT SHORT TERM GOAL #2   Title  Pt will apply psychosoical and coping mechanisms to daily activities in order to function independently when reintegrating into community dwelling    Time  3    Period  Weeks    Status  On-going      OT SHORT TERM GOAL #3   Title  Pt will recall 1-3 social opportunities to incorporate into daily routine to improve social participation with community reintegration    Time  4    Period  Weeks    Status  On-going      OT SHORT TERM GOAL #4   Title  Pt will recall and begin search for 1-3 appropriate part time work opportunities to increase community reintegration and time management    Time  4    Period  Weeks    Status  On-going               Plan - 07/04/18 1327    Occupational performance deficits (Please refer to evaluation for details):  ADL's;IADL's;Rest and Sleep;Work;Leisure;Social Participation       Patient will benefit from skilled therapeutic intervention in order to improve the following deficits and impairments:  Decreased coping skills, Decreased psychosocial skills, Other (comment)(decreased ability to engage in BADL and reintegrate into community)  Visit Diagnosis: Organic personality disorder  Difficulty coping    Problem List Patient Active Problem List   Diagnosis Date Noted  . MDD (major depressive disorder), recurrent severe, without psychosis ([place]) 06/18/2018  . Dizziness 04/05/2016  . Orthostatic hypotension 04/05/2016  . POTS (postural orthostatic tachycardia syndrome) 04/05/2016  . Chest pain 06/20/2014  . Palpitations 06/20/2014  . PVC (premature ventricular contraction) 06/20/2014  . Abdominal pain 11/07/2012  . Anxiety 11/07/2012   Zenovia Jarred, MSOT, OTR/L  [place] 07/04/2018, 1:30 PM  [facility] PARTIAL HOSPITALIZATION PROGRAM [place], [place], [address] Phone:  [phone]   Fax:  [phone]  Name: GRAYCE BUDDEN MRN: [id number] Date of Birth: 1966/03/23

## 2018-07-05 ENCOUNTER — Other Ambulatory Visit (HOSPITAL_COMMUNITY): Payer: BLUE CROSS/BLUE SHIELD | Admitting: Licensed Clinical Social Worker

## 2018-07-05 DIAGNOSIS — F332 Major depressive disorder, recurrent severe without psychotic features: Secondary | ICD-10-CM

## 2018-07-05 NOTE — Progress Notes (Signed)
Spiritual care group 07/05/2018 11:00-12:00  Facilitated by Simone Curia, MDiv    Group focused on topic of "self-care"  Patients engaged in facilitated discussion about topic.  Explored quotes related to self care and chose one which they agreed with and one which they disliked.  Engaged in discussion around quote choices and their experience / understanding of care for themselves.    Autumn Foster was present throughout group.  She had difficulty thinking of topic of self care, as she feels she has not been able to care for herself over past two years.  Is able to recall a time where she felt she had self-care.  She described feeling ok being alone and not fearful.  She describes wanting things to go back to the way they were.  Describes wishing God to deliver her from this place or for "an answer."  Received feedback from peers where they re-imagined concept of "deliverance" from a "once and done" and "all encompassing" deliverance to a deliverance that happens in the moment and is continual.

## 2018-07-05 NOTE — Psych (Signed)
   [facility] PHP THERAPIST PROGRESS NOTE  Autumn Foster [id number]  Session Time: 9:00 - 11:00  Participation Level: Active  Behavioral Response: CasualAlertDepressed  Type of Therapy: Group Therapy  Treatment Goals addressed: Coping  Interventions: CBT, DBT, Solution Focused, Supportive and Reframing  Summary:  Clinician led check-in regarding current stressors and situation, and review of patient completed daily inventory. Clinician utilized active listening and empathetic response and validated patient emotions. Clinician facilitated processing group on pertinent issues.   Therapist Response: Autumn Foster is a 52 y.o. female who presents with depression symptoms. Patient arrived within time allowed and reports that she is feeling "hopeless." Patient rates her mood at a 2 on a scale of 1-10 with 10 being great. Pt reports her anxiety and hopelessness spiked yesterday afternoon. Pt states she interviewed for a part time job and was offered the position before she left. Pt states this job has all the check boxes of being good for her right now except for not being very interesting, however pt is panicked about the idea of going back to work. Pt states since then she has been negatively ruminating and thinks she will never be better. Cln attempts to engage pt in checking the facts pointing out all of the recent group sessions in which she has had good days and positive mood  Patient is highly resistant to reframing and is argumentative with cln when attempts are made. Patient engaged in discussion.       Session Time: 11:00 -12:15  Participation Level: Active  Behavioral Response: CasualAlertDepressed  Type of Therapy: Group Therapy, psychotherapy  Treatment Goals addressed: Coping  Interventions: Strengths based, reframing, Supportive,   Summary:  Spiritual Care group  Therapist Response: Patient engaged in group. See chaplain note.          Session Time: 12:15 -  1:00  Participation Level: Active  Behavioral Response: CasualAlertDepressed  Type of Therapy: Group Therapy, Activity Therapy  Treatment Goals addressed: Coping  Interventions: Systems analyst, Supportive  Summary:  Reflection Group: Patients encouraged to practice skills and interpersonal techniques or work on mindfulness and relaxation techniques. The importance of self-care and making skills part of a routine to increase usage were stressed   Therapist Response: Patient engaged and participated appropriately.         Session Time: 1:00- 2:00  Participation Level: Active  Behavioral Response: CasualAlertDepressed  Type of Therapy: Group Therapy, Psychoeducation, Activity therapy  Treatment Goals addressed: Coping  Interventions: relaxation training; Supportive; Reframing  Summary: 12:45 - 1:50: Relaxation group: Cln led group focused on retraining the body's response to stress.   1:50 -2:00 Clinician led check-out. Clinician assessed for immediate needs, medication compliance and efficacy, and safety concerns   Therapist Response: Patient engaged in activity and discussion. At [place], patient rates her mood 5 at a  on a scale of 1-10 with 10 being great. Patient reports afternoon plans of running errands and seeing her father. Patient demonstrates limited progress as evidenced by resistance to reframing. Patient denies SI/HI/self-harm thoughts at the end of group.      Suicidal/Homicidal: Nowithout intent/plan   Plan: Pt will continue in PHP while working to decrease depression symptoms and increase ability to manage symptoms when they arise.   Diagnosis: Severe recurrent major depression without psychotic features ([place]) [F33.2]    1. Severe recurrent major depression without psychotic features [facility])       Lorin Glass, LCSW 07/05/18

## 2018-07-06 ENCOUNTER — Other Ambulatory Visit (HOSPITAL_COMMUNITY): Payer: BLUE CROSS/BLUE SHIELD | Admitting: Licensed Clinical Social Worker

## 2018-07-06 ENCOUNTER — Other Ambulatory Visit (HOSPITAL_COMMUNITY): Payer: BLUE CROSS/BLUE SHIELD | Admitting: Occupational Therapy

## 2018-07-06 ENCOUNTER — Encounter (HOSPITAL_COMMUNITY): Payer: Self-pay | Admitting: Occupational Therapy

## 2018-07-06 ENCOUNTER — Encounter (HOSPITAL_COMMUNITY): Payer: Self-pay | Admitting: Psychiatry

## 2018-07-06 DIAGNOSIS — F332 Major depressive disorder, recurrent severe without psychotic features: Secondary | ICD-10-CM | POA: Diagnosis not present

## 2018-07-06 DIAGNOSIS — R4589 Other symptoms and signs involving emotional state: Secondary | ICD-10-CM

## 2018-07-06 DIAGNOSIS — F070 Personality change due to known physiological condition: Secondary | ICD-10-CM

## 2018-07-06 MED ORDER — MIRTAZAPINE 45 MG PO TABS: 45.0000 mg | ORAL_TABLET | Freq: Every day | ORAL | 11 refills | Status: DC

## 2018-07-06 MED ORDER — LITHIUM CARBONATE ER 450 MG PO TBCR: 450.0000 mg | EXTENDED_RELEASE_TABLET | Freq: Every day | ORAL | 0 refills | Status: DC

## 2018-07-06 NOTE — Progress Notes (Signed)
[facility] Behavioral Health Follow-up Outpatient Visit  KINZIE WICKES 01-15-1966  Date: 07/06/2018   Subjective: patient is a 52 year old female enrolled in [facility] program due to worsening of depression and anxiety along with suicidal ideation.  Patient states that she continues to struggle with her motivation, reports on a scale of 0-10, with 0 being no symptoms in 10 being worst, her depression is a 7 out of 10 and on the same scale her anxiety is a 6 out of 10.patient states that she is no longer feeling suicidal, adds that her husband is supportive. She also states that she plans to start working part-time and that will keep her busy.  Patient states that she is okay with trying 45 mg of Remeron. Also discuss continuing her lithium, patient reports that she is taking 450 mg daily, discussed changing patient to Lithobid 450 mg at bedtime. Also discussed continuing her Trintellix  at 10 mg at bedtime.discussed in length with patient that she would be on to antidepressants, to monitor closely in regards to side effects.  Patient states that she is going to work on daily goals to help her with her depression and anxiety. She also adds that her daughter will be visiting this weekend which will help with her mood.   Patient denies any thoughts of self-harm, harm to others, any symptoms of psychosis   There were no vitals filed for this visit. Active Ambulatory Problems    Diagnosis Date Noted  . Abdominal pain 11/07/2012  . Anxiety 11/07/2012  . Chest pain 06/20/2014  . Palpitations 06/20/2014  . PVC (premature ventricular contraction) 06/20/2014  . Dizziness 04/05/2016  . Orthostatic hypotension 04/05/2016  . POTS (postural orthostatic tachycardia syndrome) 04/05/2016  . MDD (major depressive disorder), recurrent severe, without psychosis ([place]) 06/18/2018   Resolved Ambulatory Problems    Diagnosis Date Noted  . No Resolved Ambulatory Problems   Past Medical History:  Diagnosis Date   . Anemia   . ASCUS on Pap smear   . Bacterial infection   . Bladder spasm   . Blood in stool   . Cancer ([place])   . Candida vaginitis   . Cervical dysplasia   . Colon polyp   . Depression   . Dysmenorrhea   . Endometrial polyp   . Fatigue   . Genital warts   . Hemorrhoids   . Herpes   . History of chicken pox   . HPV (human papilloma virus) infection   . Low blood pressure   . Lyme disease   . Menorrhagia   . Osteopenia   . Ovarian cyst   . PMDD (premenstrual dysphoric disorder)   . Rectal bleed   . Urinary frequency   . Urine incontinence   . Yeast infection    Outpatient Encounter Medications as of 07/06/2018  Medication Sig Note  . LORazepam (ATIVAN) 0.5 MG tablet lorazepam 0.5 mg tablet  TAKE 1 TABLET EVERY DAY AS NEEDED   . acidophilus (RISAQUAD) CAPS capsule Take 1 capsule by mouth daily.   . cetirizine (ZYRTEC) 10 MG tablet Take 10 mg by mouth daily.   Marland Kitchen levothyroxine (SYNTHROID) 25 MCG tablet Synthroid 25 mcg tablet   . liothyronine (CYTOMEL) 5 MCG tablet liothyronine 5 mcg tablet   . lithium carbonate (ESKALITH) 450 MG CR tablet Take 1 tablet (450 mg total) by mouth at bedtime.   . Magnesium Sulfate 70 MG CAPS Take by mouth.   . mirtazapine (REMERON) 45 MG tablet Take 1 tablet (45  mg total) by mouth at bedtime.   . progesterone (PROMETRIUM) 100 MG capsule progesterone micronized 100 mg capsule  TAKE THREE CAPSULES BY MOUTH NIGHTLY   . valACYclovir (VALTREX) 500 MG tablet valacyclovir 500 mg tablet  TAKE 1 TABLET BY MOUTH EVERY DAY   . vortioxetine HBr (TRINTELLIX) 10 MG TABS tablet Take 1 tablet (10 mg total) by mouth daily.   . [DISCONTINUED] levothyroxine (SYNTHROID, LEVOTHROID) 25 MCG tablet Take 1 tablet (25 mcg total) by mouth daily before breakfast.   . [DISCONTINUED] liothyronine (CYTOMEL) 5 MCG tablet Take 1 tablet (5 mcg total) by mouth daily.   . [DISCONTINUED] lithium carbonate 300 MG capsule Take 1 capsule (300 mg total) by mouth at bedtime.   .  [DISCONTINUED] LORazepam (ATIVAN) 0.5 MG tablet Take 0.5 mg by mouth every 8 (eight) hours as needed.  06/30/2017: Only as needed.  . [DISCONTINUED] mirtazapine (REMERON) 15 MG tablet Take 2 tablets (30 mg total) by mouth at bedtime.   . [DISCONTINUED] progesterone (PROMETRIUM) 100 MG capsule Take 300 mg by mouth daily.   . [DISCONTINUED] valACYclovir (VALTREX) 500 MG tablet Take 500 mg by mouth daily.    No facility-administered encounter medications on file as of 07/06/2018.    Mental Status Examination  Appearance: casual, well dressed Alert: Yes Attention: good  Cooperative: Yes Eye Contact: Good Speech: normal in volume, rate, tone and spontaneous Psychomotor Activity: Normal Memory/Concentration: okay Oriented: person, place, time/date, situation, day of week, month of year and year Mood: Anxious, Depressed, Dysphoric and Worthless Affect: Appropriate, Congruent, Constricted and Depressed Thought Processes and Associations: Coherent, Goal Directed and Descriptions of Associations: Intact Fund of Knowledge: Fair Thought Content: Suicidal ideation, Homicidal ideation, Auditory hallucinations, Visual hallucinations, Delusions and Paranoia , none noted Insight: Fair Judgement: Fair  Diagnosis:Major depressive disorder, 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 The risks and benefits discussed in length with patient To change lithium to Lithobid 450 mg 1 at bedtime To continue Trintillex 10 MG daily Continue in PHP Discussed getting a copy of genetic testing results which are with patient's primary psychiatrist so that they can be reviewed  Hampton Abbot, MD

## 2018-07-06 NOTE — Therapy (Signed)
[facility] [place], [place], [address] Phone: [phone]   Fax:  [phone]  Occupational Therapy Treatment  Patient Details  Name: Autumn Foster MRN: [id number] Date of Birth: 1966/05/01 Referring Provider: Ricky Ala, NP   Encounter Date: 07/06/2018  OT End of Session - 07/06/18 1307    Visit Number  7    Number of Visits  16    Date for OT Re-Evaluation  07/21/18    Authorization Type  BCBS    OT Start Time  1100    OT Stop Time  1200    OT Time Calculation (min)  60 min    Activity Tolerance  Patient tolerated treatment well    Behavior During Therapy  [facility] for tasks assessed/performed       Past Medical History:  Diagnosis Date  . Anemia   . Anxiety   . ASCUS on Pap smear   . Bacterial infection   . Bladder spasm   . Blood in stool   . Cancer ([place])    Basal Cell Skin Cancer  . Candida vaginitis   . Cervical dysplasia   . Colon polyp    tubular adenoma  . Depression   . Dizziness 04/05/2016  . Dysmenorrhea   . Endometrial polyp   . Fatigue   . Genital warts   . Hemorrhoids   . Herpes   . History of chicken pox   . HPV (human papilloma virus) infection   . Low blood pressure   . Lyme disease   . Menorrhagia   . Orthostatic hypotension 04/05/2016  . Osteopenia   . Ovarian cyst   . PMDD (premenstrual dysphoric disorder)   . POTS (postural orthostatic tachycardia syndrome) 04/05/2016  . Rectal bleed   . Urinary frequency   . Urine incontinence   . Yeast infection     Past Surgical History:  Procedure Laterality Date  . COLONOSCOPY  12/29/2010   2 sessile polyps Dr. Fuller Plan  . DILATION AND CURETTAGE OF UTERUS    . HYSTEROSCOPY    . NOVASURE ABLATION      There were no vitals filed for this visit.  Subjective Assessment - 07/06/18 1306    Currently in Pain?  No/denies       S: "I feel like I am mostly assertive, but I can be passive aggressive"  O: OT treatment with  focus on assertiveness skill building. Education given on character traits/body language of assertiveness, agressiveness, passiveness, and passive agressiveness. Pt asked to identify which area they feel they are at more currently. Further education given on ways to say no, and how to build this skill. Encouraged pt to choose "assertiveness role model" to model behavior or practice with as a Product manager when integrating this skill into community. Assertiveness activity given for pts to practice skill with partner involving appropriate assertiveness conversation.  A: Pt presents to group with blunted affect and negative outlook. Pt in understanding of education given this date, and reports she feels she is mostly assertive but sometimes passive aggressive. Pt identified Multimedia programmer. Completed activity with partner with success, and applying what was learned in session by highlighting importance of "getting one's needs met and the other's".   P: Pt provided with assertiveness skills to implement into a variety of daily social situations. OT will continue to follow up with communication skills for successful implementation into daily life.  OT Education - 07/06/18 1306    Education Details  education given on assertiveness skills    Person(s) Educated  Patient    Methods  Explanation;Handout    Comprehension  Verbalized understanding       OT Short Term Goals - 06/27/18 1400      OT SHORT TERM GOAL #1   Title  Patient will be educated on strategies to improve psychosocial skills needed to participate fully in all daily, work, and leisure activities.    Time  4    Period  Weeks    Status  On-going    Target Date  07/21/18      OT SHORT TERM GOAL #2   Title  Pt will apply psychosoical and coping mechanisms to daily activities in order to function independently when reintegrating into community dwelling    Time  3    Period  Weeks    Status  On-going       OT SHORT TERM GOAL #3   Title  Pt will recall 1-3 social opportunities to incorporate into daily routine to improve social participation with community reintegration    Time  4    Period  Weeks    Status  On-going      OT SHORT TERM GOAL #4   Title  Pt will recall and begin search for 1-3 appropriate part time work opportunities to increase community reintegration and time management    Time  4    Period  Weeks    Status  On-going               Plan - 07/06/18 1307    Occupational performance deficits (Please refer to evaluation for details):  ADL's;IADL's;Rest and Sleep;Work;Leisure;Social Participation       Patient will benefit from skilled therapeutic intervention in order to improve the following deficits and impairments:  Decreased coping skills, Decreased psychosocial skills, Other (comment)(decreased ability to engage in BADL and reintegrate into community)  Visit Diagnosis: Organic personality disorder  Difficulty coping    Problem List Patient Active Problem List   Diagnosis Date Noted  . MDD (major depressive disorder), recurrent severe, without psychosis ([place]) 06/18/2018  . Dizziness 04/05/2016  . Orthostatic hypotension 04/05/2016  . POTS (postural orthostatic tachycardia syndrome) 04/05/2016  . Chest pain 06/20/2014  . Palpitations 06/20/2014  . PVC (premature ventricular contraction) 06/20/2014  . Abdominal pain 11/07/2012  . Anxiety 11/07/2012   Zenovia Jarred, MSOT, OTR/L  [place] 07/06/2018, 1:08 PM  [facility] PARTIAL HOSPITALIZATION PROGRAM [place], [place], [address] Phone: [phone]   Fax:  [phone]  Name: Autumn Foster MRN: [id number] Date of Birth: [birth year]

## 2018-07-07 ENCOUNTER — Encounter (HOSPITAL_COMMUNITY): Payer: Self-pay | Admitting: Occupational Therapy

## 2018-07-07 ENCOUNTER — Other Ambulatory Visit (HOSPITAL_COMMUNITY): Payer: BLUE CROSS/BLUE SHIELD | Admitting: Family

## 2018-07-07 ENCOUNTER — Other Ambulatory Visit (HOSPITAL_COMMUNITY): Payer: BLUE CROSS/BLUE SHIELD | Admitting: Occupational Therapy

## 2018-07-07 DIAGNOSIS — F332 Major depressive disorder, recurrent severe without psychotic features: Secondary | ICD-10-CM

## 2018-07-07 DIAGNOSIS — F070 Personality change due to known physiological condition: Secondary | ICD-10-CM

## 2018-07-07 DIAGNOSIS — R4589 Other symptoms and signs involving emotional state: Secondary | ICD-10-CM

## 2018-07-07 NOTE — Therapy (Signed)
[facility] [place], [place], [address] Phone: [phone]   Fax:  [phone]  Occupational Therapy Treatment  Patient Details  Name: Autumn Foster MRN: [id number] Date of Birth: 06-Jan-1966 Referring Provider: Ricky Ala, NP   Encounter Date: 07/07/2018  OT End of Session - 07/07/18 1331    Visit Number  8    Number of Visits  16    Date for OT Re-Evaluation  07/21/18    Authorization Type  BCBS    OT Start Time  1100    OT Stop Time  1200    OT Time Calculation (min)  60 min    Activity Tolerance  Patient tolerated treatment well    Behavior During Therapy  [facility] for tasks assessed/performed       Past Medical History:  Diagnosis Date  . Anemia   . Anxiety   . ASCUS on Pap smear   . Bacterial infection   . Bladder spasm   . Blood in stool   . Cancer ([place])    Basal Cell Skin Cancer  . Candida vaginitis   . Cervical dysplasia   . Colon polyp    tubular adenoma  . Depression   . Dizziness 04/05/2016  . Dysmenorrhea   . Endometrial polyp   . Fatigue   . Genital warts   . Hemorrhoids   . Herpes   . History of chicken pox   . HPV (human papilloma virus) infection   . Low blood pressure   . Lyme disease   . Menorrhagia   . Orthostatic hypotension 04/05/2016  . Osteopenia   . Ovarian cyst   . PMDD (premenstrual dysphoric disorder)   . POTS (postural orthostatic tachycardia syndrome) 04/05/2016  . Rectal bleed   . Urinary frequency   . Urine incontinence   . Yeast infection     Past Surgical History:  Procedure Laterality Date  . COLONOSCOPY  12/29/2010   2 sessile polyps Dr. Fuller Plan  . DILATION AND CURETTAGE OF UTERUS    . HYSTEROSCOPY    . NOVASURE ABLATION      There were no vitals filed for this visit.  Subjective Assessment - 07/07/18 1330    Currently in Pain?  No/denies           O: Stress management group completed to use as productive coping strategy, to help mitigate  maladaptive coping to integrate in functional BADL/IADL. Stress management tool worksheet discussed to educate on unhealthy vs healthy coping skills to manage stress to improve community integration. Coping strategies taught include: relaxation based- deep breathing, counting to 10, taking a 1 minute vacation, acceptance, stress balls, relaxation audio/video, visual/mental imagery. Positive mental attitude- gratitude, acceptance, cognitive reframing, positive self talk, anger management. Gratitude journaling prompts given and discussed as a group. 52 proven stress reducers handout given for reference from home. Pt to complete art activity to display 8 healthy stress management coping techniques.  A: Pt presents to group with blunted affect, negative outlook on life. Pt engaged and participatory with facilitator and other group members this date. Stress management tools worksheet completed, with pt identifying that she suppresses emotions until the point of acting out. She is often prone to blaming/complaining and shares that her family often points this out to her in frustration. Pt is resistant to all OT ideas, but eventually was willing to try journaling and gratitudes. Pt completed art activity with min VC's, needing assistance to think of 2/8  healthy coping strategies.  P: Pt provided with education on stress management activities. OT will continue to follow up with activities learned for successful implementation into daily life                   OT Education - 07/07/18 1331    Education Details  education given on stress management techniques    Person(s) Educated  Patient    Methods  Explanation;Handout    Comprehension  Verbalized understanding       OT Short Term Goals - 07/07/18 1332      OT SHORT TERM GOAL #1   Title  Patient will be educated on strategies to improve psychosocial skills needed to participate fully in all daily, work, and leisure activities.    Time  4     Period  Weeks    Status  Achieved    Target Date  07/21/18      OT SHORT TERM GOAL #2   Title  Pt will apply psychosoical and coping mechanisms to daily activities in order to function independently when reintegrating into community dwelling    Time  3    Period  Weeks    Status  Achieved      OT SHORT TERM GOAL #3   Title  Pt will recall 1-3 social opportunities to incorporate into daily routine to improve social participation with community reintegration    Time  4    Period  Weeks    Status  Achieved      OT SHORT TERM GOAL #4   Title  Pt will recall and begin search for 1-3 appropriate part time work opportunities to increase community reintegration and time management    Time  4    Period  Weeks    Status  Achieved               Plan - 07/07/18 1331    Occupational performance deficits (Please refer to evaluation for details):  ADL's;IADL's;Rest and Sleep;Work;Leisure;Social Participation       Patient will benefit from skilled therapeutic intervention in order to improve the following deficits and impairments:  Decreased coping skills, Decreased psychosocial skills, Other (comment)(decreased ability to engage in BADL and reintegrate into community)  Visit Diagnosis: Organic personality disorder  Difficulty coping    Problem List Patient Active Problem List   Diagnosis Date Noted  . MDD (major depressive disorder), recurrent severe, without psychosis ([place]) 06/18/2018  . Dizziness 04/05/2016  . Orthostatic hypotension 04/05/2016  . POTS (postural orthostatic tachycardia syndrome) 04/05/2016  . Chest pain 06/20/2014  . Palpitations 06/20/2014  . PVC (premature ventricular contraction) 06/20/2014  . Abdominal pain 11/07/2012  . Anxiety 11/07/2012    OCCUPATIONAL THERAPY DISCHARGE SUMMARY  Visits from Start of Care: 8  Current functional level related to goals / functional outcomes: Independent- pt is stepping down to IOP level of care to continue  maintenance of learned coping strategies.   Remaining deficits: Emotional regulation, negative thinking   Education / Equipment: Education given on psychosocial and coping strategies to implement into BADL/IADL routine when reintegrating into community dwelling Plan: Patient agrees to discharge.  Patient goals were met. Patient is being discharged due to meeting the stated rehab goals.  ?????         Zenovia Jarred, MSOT, OTR/L  [place] 07/07/2018, 1:35 PM  [facility] [address] [place], [place], [address] Phone: [phone]   Fax:  [phone]  Name: MARQUERITE FORSMAN MRN: [id number] Date of Birth: August 21, 1966

## 2018-07-10 ENCOUNTER — Other Ambulatory Visit (HOSPITAL_COMMUNITY): Payer: Self-pay

## 2018-07-10 ENCOUNTER — Telehealth (HOSPITAL_COMMUNITY): Payer: Self-pay

## 2018-07-10 ENCOUNTER — Ambulatory Visit (HOSPITAL_COMMUNITY): Payer: Self-pay

## 2018-07-10 ENCOUNTER — Other Ambulatory Visit (HOSPITAL_COMMUNITY): Payer: BLUE CROSS/BLUE SHIELD | Admitting: Licensed Clinical Social Worker

## 2018-07-10 ENCOUNTER — Encounter (HOSPITAL_COMMUNITY): Payer: Self-pay | Admitting: Psychiatry

## 2018-07-10 DIAGNOSIS — F332 Major depressive disorder, recurrent severe without psychotic features: Secondary | ICD-10-CM

## 2018-07-10 MED ORDER — LITHIUM CARBONATE ER 450 MG PO TBCR: 450.0000 mg | EXTENDED_RELEASE_TABLET | Freq: Every day | ORAL | 0 refills | Status: DC

## 2018-07-10 MED ORDER — MIRTAZAPINE 45 MG PO TABS: 45.0000 mg | ORAL_TABLET | Freq: Every day | ORAL | 11 refills | Status: DC

## 2018-07-10 NOTE — Progress Notes (Signed)
Autumn Foster is a 52 y.o., married, unemployed, Caucasian female who transitioned from [facility].  As previous PHP note states:  Patient presented with sad affect, depressed mood but denied any current suicidal or homicidal ideations, no auditory or visual hallucinations and no plan or intent to harm self or others. Patient rated her current level of depression a 2-3, anxiety a 2-3 and hopelessness a 1 on a scale of 0-10 with 0 being none and 10 the worst she could manage.  Patient stated she ended up going into the hospital after suicidal ideations worsened to the point her husband found her with a razor blade contemplating cutting herself.  Patient reported she had gotten to the point she did not feel her Cymbalta was helpful any longer and that nothing was working as has tried multiple past antidepressants, [place] and ECT per her report.  Patient reported her anxiety was worse prior to hospitalization as well but does feel even though she has only been on Trintellex a few days, she does feel improved and depression is less.  Patient stated "I hope its not just the honeymoon phase" but wants to continue current medication regimen at present.  Reports sleep has improved with Remeron but is a little sedated in the mornings.  Patient will continue to monitor this and discussed fall precautions.  Patient also stated she was turned down for Esketamine at her primary psychiatrist's office by her [employer].  Patient to follow up on this if medication assistance is less than targeted to improve her daily stability.  Pt started MH-IOP today.  Reports passive SI.  Reports feeling less negative.  Attended PHP 06-23-18 until 07-07-18.  States it was a good Insurance underwriter.  C/O it being too long of a day.  Pt states she is still interested in getting Esketamine.  "I suppose to start a new job next week and I don't know if I am ready.  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  Pt states she only wants to attend one week.  A:  Oriented pt.  Provided pt with an orientation folder.  Informed [facility] of admit, along with therapist Merrilee Seashore, [facility]?).  Encouraged support groups.  Will check with [facility] about status of pt transferring to Dr. Clovis Pu.  R:  Pt receptive.      Carlis Abbott, RITA, M.Ed,CNA

## 2018-07-10 NOTE — Psych (Signed)
   [facility] PHP THERAPIST PROGRESS NOTE  Autumn Foster [id number]  Session Time: 9:00 - 11:00  Participation Level: Active  Behavioral Response: CasualAlertDepressed  Type of Therapy: Group Therapy  Treatment Goals addressed: Coping  Interventions: CBT, DBT, Solution Focused, Supportive and Reframing  Summary:  Clinician led check-in regarding current stressors and situation, and review of patient completed daily inventory. Clinician utilized active listening and empathetic response and validated patient emotions. Clinician facilitated processing group on pertinent issues.   Therapist Response: Autumn Foster is a 52 y.o. female who presents with depression symptoms. Patient arrived within time allowed and reports that she is feeling "hopeless." Patient rates her mood at a 5 on a scale of 1-10 with 10 being great. Pt reports she continues to feel she is "set back." Pt is overall negative in mood and resistant to reframing and checking the facts. Pt is again voicing that she "never" has good days and that there has been no improvement. Pt is argumentative when cln and group members remind her of improvement she has reported in the past week. Pt continues to struggle with relating no immediate or quick answers to no hope. Pt admits having suicidal ideation and states "it is in the back of my head. I am not thinking about it but is seems like a viable option." Patient engaged in discussion.       Session Time: 11:00 -12:15  Participation Level: Active  Behavioral Response: CasualAlertDepressed  Type of Therapy: Group Therapy, psychotherapy  Treatment Goals addressed: Coping  Interventions: Strengths based, reframing, Supportive,   Summary:  Spiritual Care group  Therapist Response: Patient engaged in group. See chaplain note.          Session Time: 12:15 - 1:00  Participation Level: Active  Behavioral Response: CasualAlertDepressed  Type of Therapy: Group Therapy,  Activity Therapy  Treatment Goals addressed: Coping  Interventions: Systems analyst, Supportive  Summary:  Reflection Group: Patients encouraged to practice skills and interpersonal techniques or work on mindfulness and relaxation techniques. The importance of self-care and making skills part of a routine to increase usage were stressed   Therapist Response: Patient engaged and participated appropriately.         Session Time: 1:00- 2:00  Participation Level: Active  Behavioral Response: CasualAlertDepressed  Type of Therapy: Group Therapy, Psychoeducation, Activity therapy  Treatment Goals addressed: Coping  Interventions: relaxation training; Supportive; Reframing  Summary: 12:45 - 1:50: Relaxation group: Cln led group focused on retraining the body's response to stress.   1:50 -2:00 Clinician led check-out. Clinician assessed for immediate needs, medication compliance and efficacy, and safety concerns   Therapist Response: Patient engaged in activity and discussion. At [place], patient rates her mood 6 at a  on a scale of 1-10 with 10 being great. Patient reports afternoon plans of going shopping for an event this weekend. Patient demonstrates limited progress as evidenced by resistance to engaging in reframing efforts. Patient denies SI/HI/self-harm thoughts at the end of group.      Suicidal/Homicidal: Yeswithout intent/plan   Plan: Pt will continue in PHP while working to decrease depression symptoms and increase ability to manage symptoms when they arise.   Diagnosis: Severe recurrent major depression without psychotic features ([place]) [F33.2]    1. Severe recurrent major depression without psychotic features [facility])       Lorin Glass, LCSW 07/05/18

## 2018-07-10 NOTE — Psych (Signed)
   [facility] BH PHP THERAPIST PROGRESS NOTE  JURI DINNING [id number]  Session Time: 9:00 - 10:00  Participation Level: Active  Behavioral Response: CasualAlertDepressed  Type of Therapy: Group Therapy  Treatment Goals addressed: Coping  Interventions: CBT, DBT, Solution Focused, Supportive and Reframing  Summary:  Clinician led check-in regarding current stressors and situation, and review of patient completed daily inventory. Clinician utilized active listening and empathetic response and validated patient emotions. Clinician facilitated processing group on pertinent issues.   Therapist Response: VERNEAL WIERS is a 52 y.o. female who presents with depression symptoms. Patient arrived within time allowed and reports that she is feeling "sleepy." Patient rates her mood at a 7 on a scale of 1-10 with 10 being great. Pt reports she had a "good day" yesterday and was able to run errands and see people. Pt reports shifting focus on to how to fill her time and how to engage with life again. Pt reports continued drowsiness. Patient continues to struggle with feeling as if she is only better if there is an absence of all symptoms.  Patient engaged in discussion.      Session Time: 10:00 - 11:00 ? Participation Level: Active ? Behavioral Response: CasualAlertDepressed ? Type of Therapy: Group Therapy, Psychotherapy ? Treatment Goals addressed: Coping ? Interventions: CBT, Solution focused, Supportive, Reframing ? Summary: Clinician facilitated discussion on positive self talk. Group discussed ways in which their self talk played a role in their thought patterns and self esteem.  ? Therapist Response: Pt reports her self talk is negative and is able to understand that it affects her perception of reality, however feels unable to manage it. This is compounded by pt's quickness to stop practice when she does not have instant results.  ? ? ? ? Session Time: 11:00 -12:00 ? Participation  Level:?Active ? Behavioral Response:?CasualAlertDepressed ? Type of Therapy: Group Therapy, OT ? Treatment Goals addressed: Coping ? Interventions:?Psychosocial skills training, Supportive,  ? Summary:??Occupational Therapy group ? Therapist Response: Patient engaged in group. See OT note. ? ? ? ? ? ? Session Time: 12:00 - 1:00 ? Participation Level:?Active ? Behavioral Response:?CasualAlertDepressed ? Type of Therapy: Group Therapy, Psychotherapy ? Treatment Goals addressed: Coping ? Interventions:?CBT, Solution focused, Supportive, Reframing ? Summary:12:00 - 12:50 Cln continued discussion on distress tolerance. Cln continued ACCEPTS skills. Group discussed "P" pushing away and "S" sensations and how they can utilize them. ?  12:50 - 1:00 Clinician led check-out. Clinician assessed for immediate needs, medication compliance and efficacy, and safety concerns ? Therapist Response: Patient engaged in group. Pt reports struggling to know which of these skills she would utilize.  At [place], patient rates her mood at a 8 on a scale of 1-10 with 10 being great. Patient reports afternoon plans of volunteering at her daughter's school and is concerned about the weekend because she has no plans. Patient demonstrates some progress as evidenced by an increase in mood for multiple days. Patient denies SI/HI/self-harm at the end of group.     Suicidal/Homicidal: Nowithout intent/plan   Plan: Pt will continue in PHP while working to decrease depression symptoms and increase ability to manage symptoms when they arise.   Diagnosis: Severe recurrent major depression without psychotic features ([place]) [F33.2]    1. Severe recurrent major depression without psychotic features ([place])       Lorin Glass, LCSW 06/30/18

## 2018-07-10 NOTE — Psych (Signed)
   [facility] BH PHP THERAPIST PROGRESS NOTE  Autumn Foster [id number]  Session Time: 9:00 - 11:00  Participation Level: Active  Behavioral Response: CasualAlertDepressed  Type of Therapy: Group Therapy  Treatment Goals addressed: Coping  Interventions: CBT, DBT, Solution Focused, Supportive and Reframing  Summary:  Clinician led check-in regarding current stressors and situation, and review of patient completed daily inventory. Clinician utilized active listening and empathetic response and validated patient emotions. Clinician facilitated processing group on pertinent issues.   Therapist Response: [name] is a 52 y.o. female who presents with depression symptoms. Patient arrived within time allowed and reports that she is feeling "good." Patient rates her mood at a 8 on a scale of 1-10 with 10 being great. Pt reports her weekend went "overall well" and that she still struggled with anxiety. Pt states she continues to be groggy. Pt shares she saw her sisters and went to the pool. Pt reports her sister called her an "emotional vampire" and she thinks they are unsupportive of her. Pt reports positive things such as having dinner with friends and husband and going to church.  Patient engaged in discussion.      Session Time: 11:00 -12:00   Participation Level: Active   Behavioral Response: CasualAlertDepressed   Type of Therapy: Group Therapy, OT   Treatment Goals addressed: Coping   Interventions: Psychosocial skills training, Supportive,    Summary:  Occupational Therapy group   Therapist Response: Patient engaged in group. See OT note.          Session Time: 12:00 - 12:45  Participation Level: Active  Behavioral Response: CasualAlertDepressed  Type of Therapy: Group Therapy, Activity Therapy  Treatment Goals addressed: Coping  Interventions: Systems analyst, Supportive  Summary:  Reflection Group: Patients encouraged to practice skills and  interpersonal techniques or work on mindfulness and relaxation techniques. The importance of self-care and making skills part of a routine to increase usage were stressed   Therapist Response: Patient engaged and participated appropriately.          Session Time: 12:45 - 1:30  Participation Level:Active  Behavioral Response:CasualAlertDepressed  Type of Therapy: Group Therapy, Psychotherapy  Treatment Goals addressed: Coping  Interventions:CBT, Solution focused, Supportive, Reframing  Summary:12:45 - 1:20 Cln continued discussion on distress tolerance. Cln introduced TIPP skill and discussed how to utilize it. 1:20 - 1:30 Clinician led check-out. Clinician assessed for immediate needs, medication compliance and efficacy, and safety concerns   Therapist Response: Patient reports understanding of TIPP and how to incorporate into her life. At [place], patient rates her mood at a 8.5 on a scale of 1-10 with 10 being great. Patient reports afternoon plans of going to a job interview.  Patient demonstrates some progress as evidenced by working through symptoms as they happened over the weekend. Patient denies SI/HI/self-harm at the end of group.     Suicidal/Homicidal: Nowithout intent/plan   Plan: Pt will continue in PHP while working to decrease depression symptoms and increase ability to manage symptoms when they arise.   Diagnosis: Severe recurrent major depression without psychotic features ([place]) [F33.2]    1. Severe recurrent major depression without psychotic features ([place])       Lorin Glass, LCSW 06/30/18

## 2018-07-10 NOTE — Psych (Signed)
[facility] BH PHP THERAPIST PROGRESS NOTE  Autumn Foster [id number]  Session Time: 9:00 - 11:00  Participation Level: Active  Behavioral Response: CasualAlertDepressed  Type of Therapy: Group Therapy  Treatment Goals addressed: Coping  Interventions: CBT, DBT, Solution Focused, Supportive and Reframing  Summary:  Clinician led check-in regarding current stressors and situation, and review of patient completed daily inventory. Clinician utilized active listening and empathetic response and validated patient emotions. Clinician facilitated processing group on pertinent issues.   Therapist Response: Autumn Foster is a 52 y.o. female who presents with depression symptoms. Patient arrived within time allowed and reports that she is feeling "really anxious." Patient rates her mood at a 4.5 on a scale of 1-10 with 10 being great. Pt reports she had the interview for a part time job yesterday and it went well and she was offered the job before she left. Pt is able to state ways in which the job is ideal for her current situation, however states feeling "terrified" at the prospect of taking it. Pt states feeling pressure and judgment however has lack of insight that those judgments and pressures are from herself and no one else. Pt seems to be engaging in singularity in the sense that she feels no one else can relate or has been in this situation before. Pt reports rumination and negative thinking.   Patient engaged in discussion.      Session Time: 11:00 -12:00   Participation Level: Active   Behavioral Response: CasualAlertDepressed   Type of Therapy: Group Therapy, OT   Treatment Goals addressed: Coping   Interventions: Psychosocial skills training, Supportive,    Summary:  Occupational Therapy group   Therapist Response: Patient engaged in group. See OT note.          Session Time: 12:00 - 12:45  Participation Level: Active  Behavioral Response: CasualAlertDepressed  Type  of Therapy: Group Therapy, Activity Therapy  Treatment Goals addressed: Coping  Interventions: Systems analyst, Supportive  Summary:  Reflection Group: Patients encouraged to practice skills and interpersonal techniques or work on mindfulness and relaxation techniques. The importance of self-care and making skills part of a routine to increase usage were stressed   Therapist Response: Patient engaged and participated appropriately.          Session Time: 12:45 - 2:00   Participation Level: Active   Behavioral Response: CasualAlertDepressed   Type of Therapy: Group Therapy, Psychotherapy   Treatment Goals addressed: Coping   Interventions: CBT, Solution focused, Supportive, Reframing   Summary:  12:45 - 1:50 Cln continued discussion on distress tolerance. Cln introduced Self Soothe skill and pt's discussed how to utilize.  1:50 -2:00 Clinician led check-out. Clinician assessed for immediate needs, medication compliance and efficacy, and safety concerns   Therapist Response: Patient engaged in group. Pt continues to be resistant that skills will be helpful to her. At [place], patient rates her mood at a 5 on a scale of 1-10 with 10 being great. Patient reports afternoon plans of picking up her daughter. Patient demonstrates limited progress as evidenced by asking similar questions daily with lack of willingness and application. Patient denies SI/HI/self-harm at the end of group.     Suicidal/Homicidal: Nowithout intent/plan   Plan: Pt will continue in PHP while working to decrease depression symptoms and increase ability to manage symptoms when they arise.   Diagnosis: Severe recurrent major depression without psychotic features ([place]) [F33.2]    1. Severe recurrent major depression without psychotic features ([place])  Lorin Glass, LCSW 06/30/18

## 2018-07-10 NOTE — Psych (Signed)
[facility] BH PHP THERAPIST PROGRESS NOTE  Autumn Foster [id number]  Session Time: 9:00 - 11:00  Participation Level: Active  Behavioral Response: CasualAlertDepressed  Type of Therapy: Group Therapy  Treatment Goals addressed: Coping  Interventions: CBT, DBT, Solution Focused, Supportive and Reframing  Summary:  Clinician led check-in regarding current stressors and situation, and review of patient completed daily inventory. Clinician utilized active listening and empathetic response and validated patient emotions. Clinician facilitated processing group on pertinent issues.   Therapist Response: Autumn Foster is a 52 y.o. female who presents with depression symptoms. Patient arrived within time allowed and reports that she is feeling "better than yesterday." Patient rates her mood at a 7.5 on a scale of 1-10 with 10 being great. Pt reports she ran errands yesterday. Pt reports "I felt on edge last night" and used skills "and I felt in control." Pt reports she was able to acknowledge feelings and let them pass with imagery. Patient continues to struggle with "wanting a magic pill" to make everything better and is showing increased willingness to use skills.  Patient engaged in discussion.    Session Time: 11:00 -12:00   Participation Level: Active   Behavioral Response: CasualAlertDepressed   Type of Therapy: Group Therapy, OT   Treatment Goals addressed: Coping   Interventions: Psychosocial skills training, Supportive,    Summary:  Occupational Therapy group   Therapist Response: Patient engaged in group. See OT note.        Session Time: 12:00 - 12:45  Participation Level: Active  Behavioral Response: CasualAlertDepressed  Type of Therapy: Group Therapy, Activity Therapy  Treatment Goals addressed: Coping  Interventions: Systems analyst, Supportive  Summary:  Reflection Group: Patients encouraged to practice skills and interpersonal techniques or work on  mindfulness and relaxation techniques. The importance of self-care and making skills part of a routine to increase usage were stressed   Therapist Response: Patient engaged and participated appropriately.       Session Time: 12:45 - 2:00   Participation Level: Active   Behavioral Response: CasualAlertDepressed   Type of Therapy: Group Therapy, Psychotherapy   Treatment Goals addressed: Coping   Interventions: CBT, Solution focused, Supportive, Reframing   Summary:  12:45 - 1:50 Clinician introduced "Mindfulness". Clinician showed TedTalk on mindfulness and the group discussed. Group discussed "What" and "How" skills of mindfulness. Patients identified what types of activities would help them practice mindfulness. Patients took part in a body scan activity and other activities and discussed which would work best for them.  1:50 - 2:00 Clinician led check-out. Clinician assessed for immediate needs, medication compliance and efficacy, and safety concerns.     Therapist Response: Patient engaged in group. Pt identified coloring as a way to practice mindfulness. Pt reports she will try to be mindful while in the shower.  At [place], patient rates her mood at a 7 on a scale of 1-10 with 10 being great. Patient reports afternoon plans of going to to help make a balloon arch for an event tomorrow night, running some errands, and practicing mindfulness. Patient demonstrates some progress as evidenced by reporting use of skills. Patient denies SI/HI/self-harm at the end of group.   Suicidal/Homicidal: Nowithout intent/plan   Plan: Pt will continue in PHP while working to decrease depression symptoms and increase ability to manage symptoms when they arise.   Diagnosis: MDD (major depressive disorder), recurrent severe, without psychosis ([place]) [F33.2]    1. MDD (major depressive disorder), recurrent severe, without psychosis ([place])  Kioni Stahl, LPCA, LCASA 07/10/18

## 2018-07-10 NOTE — Psych (Signed)
[facility] BH PHP THERAPIST PROGRESS NOTE  Autumn Foster [id number]  Session Time: 9:00 - 10:00  Participation Level: Active  Behavioral Response: CasualAlertDepressed  Type of Therapy: Group Therapy  Treatment Goals addressed: Coping  Interventions: CBT, DBT, Solution Focused, Supportive and Reframing  Summary:  Clinician led check-in regarding current stressors and situation, and review of patient completed daily inventory. Clinician utilized active listening and empathetic response and validated patient emotions. Clinician facilitated processing group on pertinent issues.   Therapist Response: Autumn Foster is a 52 y.o. female who presents with depression symptoms. Patient arrived within time allowed and reports that she is feeling "low." Patient rates her mood at a 2 on a scale of 1-10 with 10 being great. Pt reports she had "a lot of negative thoughts" and "background thoughts of hurting myself" last night. Pt shares she did not try to use any skills. Pt denies thoughts during group and reports feeling safe going home. Pt reports she had a better time than expected putting balloon arch together and made connections with the two other moms there. Patient continues to struggle with willingness to try skills to manage symptoms/thoughts.  Patient engaged in discussion.    Session Time: 10:00 - 11:00  Participation Level: Active  Behavioral Response: CasualAlertDepressed  Type of Therapy: Group Therapy  Treatment Goals addressed: Coping  Interventions: CBT, DBT, Solution Focused, Supportive and Reframing  Summary:  Clinician led group on positive affirmations. Pt's spent time discussing positive affirmations, how they can be helpful, and "fake-it-til-you-make-it" aspect of re-reading them until we believe them.   Therapist Response: Pt identified positive affirmations for self. Pt reports she will write positive affirmations on index cards and tape them to her mirror. Pt states she  will read the affirmations every day.       Session Time: 11:00 -12:00   Participation Level: Active   Behavioral Response: CasualAlertDepressed   Type of Therapy: Group Therapy, OT   Treatment Goals addressed: Coping   Interventions: Psychosocial skills training, Supportive,    Summary:  Occupational Therapy group   Therapist Response: Patient engaged in group. See OT note.          Session Time: 12:00 - 1:00   Participation Level: Active   Behavioral Response: CasualAlertDepressed   Type of Therapy: Group Therapy, Psychotherapy   Treatment Goals addressed: Coping   Interventions: CBT, Solution focused, Supportive, Reframing   Summary:  12:00 - 12:50 Clinician introduced topic of Positive Psychology. Group watched "Positive Psychology" Ted-Talk. Patients discussed how their "lens" of life effects the way they feel. Patients identified two strategies they would be willing to try to change their "lens."     1:50 -2:00 Clinician led check-out. Clinician assessed for immediate needs, medication compliance and efficacy, and safety concerns   Therapist Response: Patient engaged in group. Pt identified writing 3 gratitudes and positive journaling as strategies to try. At [place], patient rates her mood at a 5 on a scale of 1-10 with 10 being great. Pt reports some anxiety about discharging from group. Patient reports afternoon plans of spending time with son and weekend plans of resting. Pt reports she feels good about starting IOP on Monday. Patient demonstrates some progress as evidenced by increase in reported mood during group. Patient denies SI/HI/self-harm at the end of group.   Suicidal/Homicidal: Nowithout intent/plan   Plan: Patient will discharge from [facility] due to meeting treatment goals of decreased depression and increased coping abilities. Progress was measured by observation, self-report, and  scales. Provider has approved discharge and patient reports  alignment with discharge plan. Patient will step down to IOP within this agency. Patient is scheduled to start IOP on Monday, 9/16 @ 9am. Patient denies any SI/HI at time of discharge.   Diagnosis: MDD (major depressive disorder), recurrent severe, without psychosis ([place]) [F33.2]    1. MDD (major depressive disorder), recurrent severe, without psychosis ([place])     Autumn Foster, LPCA, LCASA 07/10/18

## 2018-07-10 NOTE — Telephone Encounter (Signed)
Medication management - Met with patient in for IOP today as she reported the medication orders Dr. Dwyane Dee did for her on 07/06/18 did not show up at her pharmacy. Reports they fronted her meds until today.  Reviewed record and called Dr. Dwyane Dee as appears orders never went through to patient's pharmacy and Dr. Dwyane Dee provided new verbal orders for this nurse to resend to [facility]'s [facility] [place] [facility] [address].  Patient's Mirtazapine 45 mg, one tablet by mouth at bedtime, #30 + 11 refills and lithium carbonate (Eskalith) 450 mg, one at bedtime, #30 with no refills, both just as Dr. Dwyane Dee had ordered reordered per her instruction this date to [facility]'s [facility] [place] and verified these went through.  Patient to pick up orders today and will follow up with NP provider on tomorrow if any other issues.

## 2018-07-10 NOTE — Psych (Signed)
[facility] BH PHP THERAPIST PROGRESS NOTE  JUNE RODE [id number]  Session Time: 9:00 - 11:00  Participation Level: Active  Behavioral Response: CasualAlertDepressed  Type of Therapy: Group Therapy  Treatment Goals addressed: Coping  Interventions: CBT, DBT, Solution Focused, Supportive and Reframing  Summary:  Clinician led check-in regarding current stressors and situation, and review of patient completed daily inventory. Clinician utilized active listening and empathetic response and validated patient emotions. Clinician facilitated processing group on pertinent issues.   Therapist Response: MILAYNA ROTENBERG is a 52 y.o. female who presents with depression symptoms. Patient arrived within time allowed and reports that she is feeling "good." Patient rates her mood at a 8 on a scale of 1-10 with 10 being great. Pt reports she ran multiple errands yesterday and saw people who she did not attempt to put a mask on for. Pt reports her sister and dad came over for dinner and she made banana bread, which she considered a "huge" accomplishment.   Pt shares she is still experiencing what she believes are side effects from Remeron and is trying to ride it out. Patient continues to struggle with willingness to try new skills and ideas.  Patient engaged in discussion.      Session Time: 11:00 -12:00   Participation Level: Active   Behavioral Response: CasualAlertDepressed   Type of Therapy: Group Therapy, OT   Treatment Goals addressed: Coping   Interventions: Psychosocial skills training, Supportive,    Summary:  Occupational Therapy group   Therapist Response: Patient engaged in group. See OT note.          Session Time: 12:00 - 12:45  Participation Level: Active  Behavioral Response: CasualAlertDepressed  Type of Therapy: Group Therapy, Activity Therapy  Treatment Goals addressed: Coping  Interventions: Systems analyst, Supportive  Summary:  Reflection Group:  Patients encouraged to practice skills and interpersonal techniques or work on mindfulness and relaxation techniques. The importance of self-care and making skills part of a routine to increase usage were stressed   Therapist Response: Patient engaged and participated appropriately.          Session Time: 12:45 - 2:00   Participation Level: Active   Behavioral Response: CasualAlertDepressed   Type of Therapy: Group Therapy, Psychotherapy   Treatment Goals addressed: Coping   Interventions: CBT, Solution focused, Supportive, Reframing   Summary:  12:45 - 1:50 Cln continued discussion on distress tolerance. Cln continued ACCEPTS skills. Group discussed "C" comparisons, "E" different emotion, and "T" thoughts and how they can utilize them.    1:50 -2:00 Clinician led check-out. Clinician assessed for immediate needs, medication compliance and efficacy, and safety concerns   Therapist Response: Patient engaged in group. Pt struggled to make connections with any of the skills discussed, stating "I just don't feel like they will be helpful to me." Pt agreed to try skills before writing them off.   At [place], patient rates her mood at a 8 on a scale of 1-10 with 10 being great. Patient reports afternoon plans of going to the store, taking a walk, and making dinner. Patient demonstrates some progress as evidenced by reporting increased mood and ability to complete more tasks last night. Patient denies SI/HI/self-harm at the end of group.     Suicidal/Homicidal: Nowithout intent/plan   Plan: Pt will continue in PHP while working to decrease depression symptoms and increase ability to manage symptoms when they arise.   Diagnosis: Severe recurrent major depression without psychotic features ([place]) [F33.2]  1. Severe recurrent major depression without psychotic features ([place])       Lorin Glass, LCSW 06/30/18

## 2018-07-11 ENCOUNTER — Other Ambulatory Visit (HOSPITAL_COMMUNITY): Payer: BLUE CROSS/BLUE SHIELD | Admitting: Licensed Clinical Social Worker

## 2018-07-11 ENCOUNTER — Other Ambulatory Visit (HOSPITAL_COMMUNITY): Payer: Self-pay

## 2018-07-11 ENCOUNTER — Encounter (HOSPITAL_COMMUNITY): Payer: Self-pay | Admitting: Licensed Clinical Social Worker

## 2018-07-11 ENCOUNTER — Ambulatory Visit (HOSPITAL_COMMUNITY): Payer: Self-pay

## 2018-07-11 DIAGNOSIS — F332 Major depressive disorder, recurrent severe without psychotic features: Secondary | ICD-10-CM | POA: Diagnosis not present

## 2018-07-11 NOTE — Progress Notes (Signed)
    Daily Group Progress Note  Program: IOP  Group Time: 9am-12pm  Participation Level: Active  Behavioral Response: Appropriate  Type of Therapy:  Group Therapy  Summary of Progress:  9am-10am Clinician checked in with clients, assessing for overall level of functioning inquiring about positive self trait and completed self care activity from previous day.  Clinician and group members continued with Roadblocks to Healthy Thinking from [employer] curriculum, and processed the benefits or problems related to accepting responsibility for own behaviors. Clinician and group members reviewed 'Tips for Breaking A Cycle' including awareness, honesty, motivation, and thought stopping.  10am- 11am Clinician presented Interpersonal Effectiveness Skills, including D.E.A.R.M.A.N, G.I.V.E., and F.A.S.T.. Clinician and group members processed the effect of healthy vs unhealthy communication skills on mental health symptoms and overall wellness. Clinician reviewed skills with clients and encouraged clients to utilize skill in session. Clinician praised clients for attempting skill and being receptive to feedback. Clinician clarified the reason for each skill, objective effectiveness, relationship effectiveness, and self-respect effectiveness.  11am-12pm Chaplin processed issues related to grief and loss. Client presented fully oriented with appropriate dress and congruent affect. Client participated fully in all group discussions and activities. Client was supportive of other group members and verbalized insight into assertive vs aggressive communication. Client requested mood chart to track mood related to thoughts and feelings during the week.   Olegario Messier, LCSW

## 2018-07-11 NOTE — Progress Notes (Signed)
    Daily Group Progress Note  Program: IOP  Group Time: 9am-12pm  Participation Level: Active  Behavioral Response: Appropriate  Type of Therapy:  Group Therapy  Summary of Progress: 9am-10:30am Clinician checked in with group members, assessing for SI/HI/psychosis. Clinician inquired about completion of self care activities over the weekend. Clinician facilitated stretching exercise.  Clinician presented psychoeducational material on ' Feelings, Thoughts, and Mind Traps: A Guide to [employer]' from [employer] Clinician facilitated discussion with clients on types of distorted thinking styles and how to challenge thoughts. Clinician allowed time for clients to discuss examples of distorted thoughts as well as help other group members create more helpful thoughts and phrases. Clinician wrapped up with Steps for challenging mind traps.  10:30-12pm Clinician continued with Roadblocks to Healthy Thinking 'Ways of Thinking That Keep You Stuck.' Clinician facilitated conversation related to how distorted thinking effects behaviors. Clinician reminded group members of Cognitive Triangle and the connection between thoughts, feelings, and behaviors. Clinician and group members discussed when examples are genuine (ex: confusion) vs when they are unhelpful and help avoid taking responsibility for own behaviors.  Clinician closed requested a self care activity planned for the rest of the day. Client presented fully oriented this day with ongoing symptoms of depression. Client participated fully in all discussions and group activities. See case management note for orientation and discharge planning.   Olegario Messier, LCSW

## 2018-07-11 NOTE — Progress Notes (Signed)
Psychiatric Initial Adult Assessment IOP  Patient Identification: Autumn Foster MRN:  [id number] Date of Evaluation:  07/11/2018 Referral Source: PHP Chief Complaint:  MDD Visit Diagnosis:    ICD-10-CM   1. 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 Reports history of chronic depression and anxiety which started in 2017. States she had some prior depressive episodes before then but they were generally mild .She has been experiencing suicidal ideations and recently has had thoughts of cutting wrists or hanging herself. States that yesterday she did hold a razor but did notactuallycut self. Told her husband,who encouraged her to come to hospital. Statesher depression is chronic , and she feels she is no longer able to enjoy activities or function at her prior level of functioning due to her depression. States she has been feeling worseover the last several weeks. She recently had gone to a a residential program in [place], but stayed only 2-3 days because she did not feel it was a good fit for her, as itwas more focused on addiction issues, which she denies having.Of note, states that while she was there Cymbalta was titrated from 30 mgrs up to 60 mgrs QDAY , but feels this titration has not been helping and has beencausing poor appetite  Evaluation to IOP: Patient completed partial hospitalization program.  Continues to present with symptoms of worry and ruminating thoughts related to medication management.  Reports her suicidal ideation has declined since her admission.  Currently denying suicidal homicidal ideations during this assessment.  Denies auditory or visual hallucinations.  Reports feeling better with medication combination however has concerns with being in the "foggy" as her medications has  been titrated.  Reports she is going to start ketamine treatment today.  Patient reports she is feeling encouraged that she has a job opportunity starting on next week.  Reports her anxiety and insomnia has improved since her inpatient admission.  Patient reports she will only attend intensive outpatient program for 1 week and will keep her follow-up appointment with her psychiatrist. support encouragement reassurance was provided.   Associated Signs/Symptoms: Depression Symptoms:  depressed mood, difficulty concentrating, disturbed sleep, (Hypo) Manic Symptoms:  Distractibility, Labiality of Mood, Anxiety Symptoms:  Excessive Worry, Psychotic Symptoms:  Hallucinations: None PTSD Symptoms: NA  Past Psychiatric History: hospitalizations 3, Previous suicide attempts 2 and Past medication trials per admission assessment.We discussed medication options at length. Patient describes having been on multiple psychiatric medications in the past with limited improvement . Regarding current medications, she states Lithium at higher doses has caused side effects, Cymbalta is not helping and recent titration to 60 mgrs poorly tolerated. Prefers to discontinue this medication.  She is expressing interest in Trintellix , which she has not been on before , and agrees to Remeron, which may help depression and also help address poor sleep and decreased appetite . Agrees to gradual titration of Lithium as an augmentation strategy. Will start medications at lower doses , consider slower titration, based on reported history of poor tolerance to several prior antidepressant trials.  Previous Psychotropic Medications: Yes   Substance Abuse History in the last 12 months:  Yes.    Consequences of Substance Abuse: NA  Past Medical History:  Past Medical History:  Diagnosis Date  . Anemia   . Anxiety   . ASCUS on Pap smear   . Bacterial  infection   . Bladder spasm   . Blood in stool   . Cancer ([place])     Basal Cell Skin Cancer  . Candida vaginitis   . Cervical dysplasia   . Colon polyp    tubular adenoma  . Depression   . Dizziness 04/05/2016  . Dysmenorrhea   . Endometrial polyp   . Fatigue   . Genital warts   . Hemorrhoids   . Herpes   . History of chicken pox   . HPV (human papilloma virus) infection   . Low blood pressure   . Lyme disease   . Menorrhagia   . Orthostatic hypotension 04/05/2016  . Osteopenia   . Ovarian cyst   . PMDD (premenstrual dysphoric disorder)   . POTS (postural orthostatic tachycardia syndrome) 04/05/2016  . Rectal bleed   . Urinary frequency   . Urine incontinence   . Yeast infection     Past Surgical History:  Procedure Laterality Date  . COLONOSCOPY  12/29/2010   2 sessile polyps Dr. Fuller Plan  . DILATION AND CURETTAGE OF UTERUS    . HYSTEROSCOPY    . Marceline ABLATION      Family Psychiatric History: Reports her mother completed suicide.  Family History:  Family History  Problem Relation Age of Onset  . Prostate cancer Father   . Hypertension Father   . Renal cancer Father   . Migraines Mother   . Alcohol abuse Mother   . Suicidality Mother        deceased  . Colon cancer Paternal Grandfather   . Dementia Paternal Grandmother   . Thyroid disease Maternal Grandmother   . Rheum arthritis Maternal Grandmother   . Diabetes Maternal Grandfather   . Anxiety disorder Sister        x 2    Social History:   Social History   Socioeconomic History  . Marital status: Married    Spouse name: Jaci Standard   . Number of children: 3  . Years of education: college  . Highest education level: Not on file  Occupational History  . Not on file  Social Needs  . Financial resource strain: Not hard at all  . Food insecurity:    Worry: Never true    Inability: Never true  . Transportation needs:    Medical: No    Non-medical: No  Tobacco Use  . Smoking status: Never Smoker  . Smokeless tobacco: Never Used  Substance and Sexual Activity  .  Alcohol use: Yes    Alcohol/week: 0.0 standard drinks    Comment: 1-2 drinks/week  . Drug use: No  . Sexual activity: Yes    Partners: Male    Birth control/protection: None    Comment: vasectony  Lifestyle  . Physical activity:    Days per week: 5 days    Minutes per session: 30 min  . Stress: Not at all  Relationships  . Social connections:    Talks on phone: More than three times a week    Gets together: More than three times a week    Attends religious service: More than 4 times per year    Active member of club or organization: No    Attends meetings of clubs or organizations: Never    Relationship status: Married  Other Topics Concern  . Not on file  Social History Narrative   Drinks 1 cup of coffee a day     Additional Social History:   Allergies:   Allergies  Allergen Reactions  . Acyclovir And Related   . Doxycycline     Fatigue  . Lexapro [Escitalopram Oxalate] Other (See Comments)    Insomnia   . Pristiq [Desvenlafaxine]     Dry heaves/dizzy  . Prozac [Fluoxetine Hcl]     HA and Strange Dreams  . Septra Ds [Sulfamethoxazole-Trimethoprim]     Jitteriness/hyper/affecting sleep  . Penicillins Rash    Childhood rash    Metabolic Disorder Labs: Lab Results  Component Value Date   HGBA1C 4.7 (L) 06/18/2018   MPG 88.19 06/18/2018   No results found for: PROLACTIN Lab Results  Component Value Date   CHOL 226 (H) 06/18/2018   TRIG 41 06/18/2018   HDL 75 06/18/2018   CHOLHDL 3.0 06/18/2018   VLDL 8 06/18/2018   LDLCALC 143 (H) 06/18/2018   LDLCALC 98 11/07/2012     Current Medications: Current Outpatient Medications  Medication Sig Dispense Refill  . acidophilus (RISAQUAD) CAPS capsule Take 1 capsule by mouth daily. 30 capsule 0  . cetirizine (ZYRTEC) 10 MG tablet Take 10 mg by mouth daily.    Marland Kitchen doxycycline (DORYX) 100 MG EC tablet Take 100 mg by mouth 2 (two) times daily.    Marland Kitchen levothyroxine (SYNTHROID) 25 MCG tablet Synthroid 25 mcg tablet     . liothyronine (CYTOMEL) 5 MCG tablet liothyronine 5 mcg tablet    . lithium carbonate (ESKALITH) 450 MG CR tablet Take 1 tablet (450 mg total) by mouth at bedtime. 30 tablet 0  . LORazepam (ATIVAN) 0.5 MG tablet lorazepam 0.5 mg tablet  TAKE 1 TABLET EVERY DAY AS NEEDED    . Magnesium Sulfate 70 MG CAPS Take by mouth.    . mirtazapine (REMERON) 45 MG tablet Take 1 tablet (45 mg total) by mouth at bedtime. 30 tablet 11  . progesterone (PROMETRIUM) 100 MG capsule progesterone micronized 100 mg capsule  TAKE THREE CAPSULES BY MOUTH NIGHTLY    . valACYclovir (VALTREX) 500 MG tablet valacyclovir 500 mg tablet  TAKE 1 TABLET BY MOUTH EVERY DAY    . vortioxetine HBr (TRINTELLIX) 10 MG TABS tablet Take 1 tablet (10 mg total) by mouth daily. 30 tablet 0   No current facility-administered medications for this visit.     Neurologic: Headache: No Seizure: No Paresthesias:No  Musculoskeletal: Strength & Muscle Tone: within normal limits Gait & Station: normal Patient leans: N/A  Psychiatric Specialty Exam: Review of Systems  Psychiatric/Behavioral: Positive for depression. The patient is nervous/anxious and has insomnia.   All other systems reviewed and are negative.   There were no vitals taken for this visit.There is no height or weight on file to calculate BMI.  General Appearance: Casual  Eye Contact:  Fair  Speech:  Clear and Coherent  Volume:  Normal  Mood:  Anxious and Depressed  Affect:  Congruent and Flat  Thought Process:  Coherent  Orientation:  Full (Time, Place, and Person)  Thought Content:  Logical  Suicidal Thoughts:  No  Homicidal Thoughts:  No  Memory:  Immediate;   Fair Recent;   Fair Remote;   Fair  Judgement:  Fair  Insight:  Lacking  Psychomotor Activity:  Normal  Concentration:  Concentration: Fair  Recall:  [place]  Language: Fair  Akathisia:  No  Handed:  Right  AIMS (if indicated):    Assets:  Communication Skills Desire for  Improvement Resilience Social Support  ADL's:  Intact  Cognition: WNL  Sleep: Reported 4 to 5 hours with  medication    Treatment Plan Summary: Admit to (IOP)intensive outpatient program Medication management  Continue lithium 450 p.o. Nightly  Continue Remeron 45 mg p.o. Nightly  Discontinued Trintellix 10 mg   Patient reports starting Ketamine treatment on 07/12/2018  Treatment plan was reviewed and agreed upon by NP T. Bobby Rumpf and patient Autumn Foster need for continued group services.   Derrill Center, NP 9/17/20191:47 PM

## 2018-07-12 ENCOUNTER — Other Ambulatory Visit (HOSPITAL_COMMUNITY): Payer: Self-pay

## 2018-07-12 ENCOUNTER — Other Ambulatory Visit (HOSPITAL_COMMUNITY): Payer: BLUE CROSS/BLUE SHIELD | Admitting: Psychiatry

## 2018-07-12 ENCOUNTER — Telehealth (HOSPITAL_COMMUNITY): Payer: Self-pay | Admitting: Psychiatry

## 2018-07-12 ENCOUNTER — Encounter (HOSPITAL_COMMUNITY): Payer: Self-pay | Admitting: Family

## 2018-07-12 NOTE — Progress Notes (Signed)
  [facility] Partial Outpatient Program Discharge Summary  Autumn Foster [id number]  Admission date: 06/23/2018 Discharge date: 07/04/2018  Reason for admission: Depression Per admission assessment to inpatient hospitalizationyear old married femalewhopresented to the hospital with her husband. Reports history of chronic depression and anxiety which started in 2017. States she had some prior depressive episodes before then but they were generally mild .She has been experiencing suicidal ideations and recently has had thoughts of cutting wrists or hanging herself. States that yesterday she did hold a razor but did notactuallycut self. Told her husband,who encouraged her to come to hospital. Statesher depression is chronic , and she feels she is no longer able to enjoy activities or function at her prior level of functioning due to her depression. States she has been feeling worseover the last several weeks. She recently had gone to a a residential program in [place], but stayed only 2-3 days because she did not feel it was a good fit for her, as itwas more focused on addiction issues, which she denies having.Of note, states that while she was there Cymbalta was titrated from 30 mgrs up to 60 mgrs QDAY , but feels this titration has not been helping and has beencausing poor appetite    Progress in Program Toward Treatment Goals: Ongoing patient has successfully completed PHP  Progress (rationale): Patient is stepping down to IOP ( intensive outpatient)   Take all medications as prescribed. Keep all follow-up appointments as scheduled.  Do not consume alcohol or use illegal drugs while on prescription medications. Report any adverse effects from your medications to your primary care provider promptly.  In the event of recurrent symptoms or worsening symptoms, call 911, a crisis hotline, or go to the nearest emergency department for evaluation.   Derrill Center,  NP 07/12/2018

## 2018-07-13 ENCOUNTER — Ambulatory Visit (HOSPITAL_COMMUNITY): Payer: Self-pay

## 2018-07-13 ENCOUNTER — Other Ambulatory Visit (HOSPITAL_COMMUNITY): Payer: BLUE CROSS/BLUE SHIELD | Admitting: Licensed Clinical Social Worker

## 2018-07-13 ENCOUNTER — Other Ambulatory Visit (HOSPITAL_COMMUNITY): Payer: Self-pay

## 2018-07-13 DIAGNOSIS — F332 Major depressive disorder, recurrent severe without psychotic features: Secondary | ICD-10-CM

## 2018-07-13 NOTE — Progress Notes (Signed)
    Daily Group Progress Note  Program: IOP  Group Time: 9a,-12pm  Participation Level: Active  Behavioral Response: Appropriate  Type of Therapy:  Group Therapy  Summary of Progress: The purpose of this group is to utilizes CBT skills in a group setting to increase the use of healthy coping skills and decrease intensity of active mental healthy symptoms.  Clinician presented the topic of Cognitive Modeling. The purpose of this topic is to address the though changes in the cognitive triangle to effect feelings and responsive behaviors. Clinician requested clients share a circumstance that is a fact, with resulting thoughts feelings and behaviors. Next, clinician and client reviewed identifying a new thought related to the same situation, which could lead to a different feeling and behavioral response. Clinician allowed time for processing of associated thoughts and feelings for circumstances.  Second part of group was utilized to provide time for practicing yoga and meditation as mindfulness skill. Client presented fully oriented with blunted affect throughout group session. Client participated in group activities and actively listened and provided feedback to group members during conversations. Client continues to struggle with unhelpful thoughts related to having ongoing mental illness and family members not understanding. Client reports some increased anxiety due to fear related to starting her new job on Monday.    Olegario Messier, LCSW

## 2018-07-14 ENCOUNTER — Other Ambulatory Visit (HOSPITAL_COMMUNITY): Payer: BLUE CROSS/BLUE SHIELD | Admitting: Licensed Clinical Social Worker

## 2018-07-14 ENCOUNTER — Other Ambulatory Visit (HOSPITAL_COMMUNITY): Payer: Self-pay

## 2018-07-14 ENCOUNTER — Ambulatory Visit (HOSPITAL_COMMUNITY): Payer: Self-pay

## 2018-07-14 ENCOUNTER — Encounter (HOSPITAL_COMMUNITY): Payer: Self-pay | Admitting: Family

## 2018-07-14 DIAGNOSIS — Z79899 Other long term (current) drug therapy: Secondary | ICD-10-CM

## 2018-07-14 DIAGNOSIS — F332 Major depressive disorder, recurrent severe without psychotic features: Secondary | ICD-10-CM

## 2018-07-14 NOTE — Patient Instructions (Signed)
D:  Patient completed MH-IOP today.  A:  Follow up with Dr. Clovis Pu on 08-16-18 and Merrilee Seashore, [facility] on 07-17-18 @ 2pm.  Encouraged support groups.  R:  Patient receptive.

## 2018-07-14 NOTE — Progress Notes (Signed)
Autumn Foster is a 52 y.o. , married, unemployed, Caucasian female who transitioned from [facility].  As previous PHP note states:  Patient presented with sad affect, depressed mood but denied any current suicidal or homicidal ideations, no auditory or visual hallucinations and no plan or intent to harm self or others. Patient rated her current level of depression a 2-3, anxiety a 2-3 and hopelessness a 1 on a scale of 0-10 with 0 being none and 10 the worst she could manage. Patient stated she ended up going into the hospital after suicidal ideations worsened to the point her husband found her with a razor blade contemplating cutting herself. Patient reported she had gotten to the point she did not feel her Cymbalta was helpful any longer and that nothing was working as has tried multiple past antidepressants, Brethren and ECT per her report. Patient reported her anxiety was worse prior to hospitalization as well but does feel even though she has only been on Trintellex a few days, she does feel improved and depression is less. Patient stated "I hope its not just the honeymoon phase" but wants to continue current medication regimen at present. Reports sleep has improved with Remeron but is a little sedated in the mornings. Patient will continue to monitor this and discussed fall precautions. Patient also stated she was turned down for Esketamine at her primary psychiatrist's office by her [employer]. Patient to follow up on this if medication assistance is less than targeted to improve her daily stability.  Pt started MH-IOP on 07-10-18.  Reported passive SI.  Reported feeling less negative.  Attended PHP 06-23-18 until 07-07-18.  Stated it was a good Insurance underwriter.  C/O it being too long of a day.  Pt stated she is still interested in getting Esketamine.  "I suppose to start a new job next week and I don't know if I am ready.  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  Pt denies any SI today.  Also, denies HI or A/V hallucinations.  Reports that the program wasn't helpful at all.  Continues to states she has a severe chemical imbalance.  C/O feeling "loopy" on the meds; although she did add that the regimen is helping her though.  A:  D/C today.  Provided pt with support.  Encouraged pt to stay on the regimen and give it time before just stopping it.  F/U with Dr. Clovis Pu on 08-16-18 and Merrilee Seashore, [facility] on 07-17-18.  Encouraged support groups.  R:  Pt receptive.     Carlis Abbott, RITA

## 2018-07-14 NOTE — Progress Notes (Signed)
  [facility] Intensive Outpatient Program Discharge Summary  Autumn Foster [id number]  Admission date: 07/07/2018 Discharge date: 07/14/2018   Reason for admission: Major Depression  Per admission assessment to inpatient hospitalizationyear old married femalewhopresented to the hospital with her husband. Reports history of chronic depression and anxiety which started in 2017. States she had some prior depressive episodes before then but they were generally mild .She has been experiencing suicidal ideations and recently has had thoughts of cutting wrists or hanging herself. States that yesterday she did hold a razor but did notactuallycut self. Told her husband,who encouraged her to come to hospital. Statesher depression is chronic , and she feels she is no longer able to enjoy activities or function at her prior level of functioning due to her depression. States she has been feeling worseover the last several weeks. She recently had gone to a a residential program in [place], but stayed only 2-3 days because she did not feel it was a good fit for her, as itwas more focused on addiction issues, which she denies having.Of note, states that while she was there Cymbalta was titrated from 30 mgrs up to 60 mgrs QDAY , but feels this titration has not been helping and has beencausing poor appetite  Progress in Program Toward Treatment Goals: Ongoing, patient attended and participated with daily group sessions. Patient has successfully completed Partial hospitalization outpatient programing  and has requested to be discharged from Intensive Outpatient Services. Reports starting Ketamine treatment. Autumn Foster continues to be focused on medications and mediations adjustments. Patient to continue with Lithium Carbonate 450 mg and  Remeron 45 mg.patient has 11 refills available.  Progress (rationale): Keep follow-up with Dr. Clovis Pu and Merrilee Seashore. LPC. Medication was refilled at discharge.  Li level to be collected by outpatient provider.   Take all medications as prescribed. Keep all follow-up appointments as scheduled.  Do not consume alcohol or use illegal drugs while on prescription medications. Report any adverse effects from your medications to your primary care provider promptly.  In the event of recurrent symptoms or worsening symptoms, call 911, a crisis hotline, or go to the nearest emergency department for evaluation.    Derrill Center, NP 07/14/2018

## 2018-07-17 ENCOUNTER — Other Ambulatory Visit (HOSPITAL_COMMUNITY): Payer: Self-pay

## 2018-07-17 ENCOUNTER — Other Ambulatory Visit (HOSPITAL_COMMUNITY): Payer: BLUE CROSS/BLUE SHIELD

## 2018-07-17 ENCOUNTER — Ambulatory Visit (HOSPITAL_COMMUNITY): Payer: Self-pay

## 2018-07-17 NOTE — Progress Notes (Signed)
    Daily Group Progress Note  Program: IOP  Group Time: 9am-12pm  Participation Level: Active  Behavioral Response: Appropriate  Type of Therapy:  Group Therapy  Summary of Progress: Clinician checked in with group inquiring about completed self care activity and level of functioning.  Clinician presented psychoeducational information related to neuro-plasticity. Clinician facilitated discussion related to video.Clinician and group members discussed activities listed in video, including mindfulness and gratitude aiding coping skills. Clinician and group members practiced skill in session and group members provided feedback on challenges.  Clinician facilitated mindful walking activity.  Clinician presented general coping skills overview worksheet with pros and cons of types of coping skills including distraction, grounding,emotional release, self love, thought challenging, and accessing higher self. Clinician facilitated discussion on when each skill could be useful. Client is discharging on this day. Client followed discussions with several questions for additional psychoeducational information. Client discussed current barriers with utilizing coping skills in the moment though states does not often practice skills outside of distressing situations. Client plans to attend a distraction technique during her drive to [place] with the family over the weekend.  Olegario Messier, LCSW

## 2018-07-18 ENCOUNTER — Other Ambulatory Visit (HOSPITAL_COMMUNITY): Payer: Self-pay

## 2018-07-18 ENCOUNTER — Other Ambulatory Visit (HOSPITAL_COMMUNITY): Payer: BLUE CROSS/BLUE SHIELD

## 2018-07-18 ENCOUNTER — Ambulatory Visit (HOSPITAL_COMMUNITY): Payer: Self-pay

## 2018-07-18 LAB — LITHIUM LEVEL: LITHIUM LVL: 0.3 mmol/L — AB (ref 0.6–1.2)

## 2018-07-19 ENCOUNTER — Other Ambulatory Visit (HOSPITAL_COMMUNITY): Payer: Self-pay

## 2018-07-19 ENCOUNTER — Other Ambulatory Visit (HOSPITAL_COMMUNITY): Payer: BLUE CROSS/BLUE SHIELD

## 2018-07-20 ENCOUNTER — Other Ambulatory Visit (HOSPITAL_COMMUNITY): Payer: BLUE CROSS/BLUE SHIELD

## 2018-07-21 ENCOUNTER — Other Ambulatory Visit (HOSPITAL_COMMUNITY): Payer: BLUE CROSS/BLUE SHIELD

## 2018-07-24 ENCOUNTER — Other Ambulatory Visit (HOSPITAL_COMMUNITY): Payer: BLUE CROSS/BLUE SHIELD

## 2018-07-25 ENCOUNTER — Other Ambulatory Visit (HOSPITAL_COMMUNITY): Payer: BLUE CROSS/BLUE SHIELD

## 2018-07-26 ENCOUNTER — Other Ambulatory Visit (HOSPITAL_COMMUNITY): Payer: BLUE CROSS/BLUE SHIELD

## 2018-07-27 ENCOUNTER — Other Ambulatory Visit (HOSPITAL_COMMUNITY): Payer: BLUE CROSS/BLUE SHIELD

## 2018-07-28 ENCOUNTER — Other Ambulatory Visit (HOSPITAL_COMMUNITY): Payer: BLUE CROSS/BLUE SHIELD

## 2018-07-31 ENCOUNTER — Other Ambulatory Visit (HOSPITAL_COMMUNITY): Payer: BLUE CROSS/BLUE SHIELD

## 2018-08-01 ENCOUNTER — Other Ambulatory Visit (HOSPITAL_COMMUNITY): Payer: BLUE CROSS/BLUE SHIELD

## 2018-08-02 ENCOUNTER — Other Ambulatory Visit (HOSPITAL_COMMUNITY): Payer: BLUE CROSS/BLUE SHIELD

## 2018-08-03 ENCOUNTER — Other Ambulatory Visit (HOSPITAL_COMMUNITY): Payer: BLUE CROSS/BLUE SHIELD

## 2018-08-04 ENCOUNTER — Other Ambulatory Visit (HOSPITAL_COMMUNITY): Payer: BLUE CROSS/BLUE SHIELD

## 2018-08-04 DIAGNOSIS — F411 Generalized anxiety disorder: Secondary | ICD-10-CM

## 2018-08-05 ENCOUNTER — Ambulatory Visit (HOSPITAL_COMMUNITY): Payer: Self-pay | Admitting: Psychiatry

## 2018-08-07 ENCOUNTER — Other Ambulatory Visit (HOSPITAL_COMMUNITY): Payer: BLUE CROSS/BLUE SHIELD

## 2018-08-08 ENCOUNTER — Other Ambulatory Visit (HOSPITAL_COMMUNITY): Payer: BLUE CROSS/BLUE SHIELD

## 2018-08-08 ENCOUNTER — Other Ambulatory Visit (HOSPITAL_COMMUNITY): Payer: Self-pay | Admitting: Family

## 2018-08-08 DIAGNOSIS — F332 Major depressive disorder, recurrent severe without psychotic features: Secondary | ICD-10-CM

## 2018-08-08 MED ORDER — LITHIUM CARBONATE ER 450 MG PO TBCR: 450.0000 mg | EXTENDED_RELEASE_TABLET | Freq: Every day | ORAL | 0 refills | Status: DC

## 2018-08-08 NOTE — Progress Notes (Signed)
Refilled Lithium 450 mg CR x 10 days as patient reports a follow-up appt with MD Cottle 10/23rd. - states her offices wasn't willing to refill medication w/o a follow-up visit.  Li level 0.3- Patient reports tolerating medications well. Denies side effects ie.Marland Kitchen Headaches, nausea, vomiting or tremors. Education was provided, patient to keep f/u appt. Support encouragement and reassurance was provided.

## 2018-08-09 ENCOUNTER — Other Ambulatory Visit (HOSPITAL_COMMUNITY): Payer: BLUE CROSS/BLUE SHIELD

## 2018-08-10 ENCOUNTER — Other Ambulatory Visit (HOSPITAL_COMMUNITY): Payer: BLUE CROSS/BLUE SHIELD

## 2018-08-11 ENCOUNTER — Other Ambulatory Visit (HOSPITAL_COMMUNITY): Payer: BLUE CROSS/BLUE SHIELD

## 2018-08-14 ENCOUNTER — Other Ambulatory Visit (HOSPITAL_COMMUNITY): Payer: BLUE CROSS/BLUE SHIELD

## 2018-08-15 ENCOUNTER — Other Ambulatory Visit (HOSPITAL_COMMUNITY): Payer: BLUE CROSS/BLUE SHIELD

## 2018-08-16 ENCOUNTER — Ambulatory Visit: Payer: BLUE CROSS/BLUE SHIELD | Admitting: Psychiatry

## 2018-08-16 ENCOUNTER — Encounter

## 2018-08-16 ENCOUNTER — Other Ambulatory Visit (HOSPITAL_COMMUNITY): Payer: BLUE CROSS/BLUE SHIELD

## 2018-08-16 ENCOUNTER — Encounter: Payer: Self-pay | Admitting: Psychiatry

## 2018-08-16 DIAGNOSIS — F411 Generalized anxiety disorder: Secondary | ICD-10-CM | POA: Diagnosis not present

## 2018-08-16 DIAGNOSIS — F332 Major depressive disorder, recurrent severe without psychotic features: Secondary | ICD-10-CM | POA: Diagnosis not present

## 2018-08-16 MED ORDER — LITHIUM CARBONATE 300 MG PO CAPS: 600.0000 mg | ORAL_CAPSULE | Freq: Every day | ORAL | 1 refills | Status: DC

## 2018-08-16 MED ORDER — VORTIOXETINE HBR 10 MG PO TABS: 10.0000 mg | ORAL_TABLET | Freq: Every day | ORAL | 1 refills | Status: DC

## 2018-08-16 MED ORDER — MIRTAZAPINE 30 MG PO TABS: 60.0000 mg | ORAL_TABLET | Freq: Every day | ORAL | 1 refills | Status: DC

## 2018-08-16 NOTE — Progress Notes (Signed)
Autumn Foster [id number] Nov 13, 1965 52 y.o.  Subjective:   Patient ID:  Autumn Foster is a 52 y.o. (DOB 27-Dec-1965) female.  Chief Complaint:  Chief Complaint  Patient presents with  . Anxiety  . Depression   Time 60 mins the case was also discussed with Thayer Headings, psychiatric nurse practitioner who is been managing the patient for quite some time. HPI  CC: anxiety and depression.  Wanted to change in hopes of finding better med regimen to control her symptoms.   Autumn Foster presents to the office today for follow-up of TR severe, major depression and anxiety. Hosp for SI wtith plan to cut wrists, wrote a sui note and H stopped her on Aug 25 and hosp then until 8/29 and started PHP for 3 weeks. Helped some. Then [place] did IV infusion #6 from9-17 to 9-26.  Resolved SI and depression much improved.  Anxiety better from 7 to 3/10 and creeping up again.  Anx had been unbearable, felt in chest and sternum with weight, heaviness and panicked.  Thoughts were I can't be in my skin.  Can't sit comfortably.  Need to move. Most of thoughts why do I feel this way and how can I stop it and ruminate on it. [employer] won't pay for Spravato.  Mirtazapine helped sleep and anxiety. 8 hours.  Previous psych med trials include sertraline, fluoxetine, lamotrigine which caused a rash twice, clonazepam which caused her to be sleepy and have a headache, Pristiq which caused her to "jump out of my skin", Lexapro, Abilify with muscle twitches, buspirone uncontrollable crying, citalopram, Seroquel, prepped pramipexole which cause muscle twitching, Nuvigil, Viibryd which was ineffective, Latuda which was ineffective, Deplin, bupropion which increased her anxiety, gabapentin which caused her to feel tired and loopy, hydroxyzine which cause dizziness, duloxetine.  Review of Systems:  Review of Systems  Gastrointestinal: Positive for abdominal distention and constipation. Negative for nausea.   Neurological: Negative for dizziness and tremors.  Psychiatric/Behavioral: Positive for sleep disturbance. Negative for agitation, behavioral problems, confusion, decreased concentration, hallucinations and suicidal ideas. The patient is nervous/anxious. The patient is not hyperactive.     Medications: I have reviewed the patient's current medications.  Current Outpatient Medications  Medication Sig Dispense Refill  . acidophilus (RISAQUAD) CAPS capsule Take 1 capsule by mouth daily. 30 capsule 0  . cetirizine (ZYRTEC) 10 MG tablet Take 10 mg by mouth daily.    Marland Kitchen levothyroxine (SYNTHROID) 25 MCG tablet Synthroid 25 mcg tablet    . liothyronine (CYTOMEL) 5 MCG tablet Take 5 mcg by mouth daily. Take 1 in the morning & 1/2 at bedtime daily    . Magnesium Sulfate 70 MG CAPS Take by mouth.    . valACYclovir (VALTREX) 500 MG tablet valacyclovir 500 mg tablet  TAKE 1 TABLET BY MOUTH EVERY DAY    . vortioxetine HBr (TRINTELLIX) 10 MG TABS tablet Take 1 tablet (10 mg total) by mouth daily. 30 tablet 1  . hydrochlorothiazide (HYDRODIURIL) 25 MG tablet Take 25 mg by mouth every 6 (six) hours as needed (take 1 every 6 hours for anxiety or insomnia).    Marland Kitchen lithium carbonate 300 MG capsule Take 2 capsules (600 mg total) by mouth at bedtime. 60 capsule 1  . mirtazapine (REMERON) 30 MG tablet Take 2 tablets (60 mg total) by mouth at bedtime. 60 tablet 1  . progesterone (PROMETRIUM) 100 MG capsule progesterone micronized 100 mg capsule  TAKE THREE CAPSULES BY MOUTH NIGHTLY    .  propranolol (INDERAL) 10 MG tablet Take 10 mg by mouth 2 (two) times daily as needed (1-2 bid as needed for anxiety).     No current facility-administered medications for this visit.     Medication Side Effects: Sedation and Other: hungry.  She feels this unsustainable. Internal tremor from the Trintellix.  Allergies:  Allergies  Allergen Reactions  . Acyclovir And Related   . Doxycycline     Fatigue  . Lexapro [Escitalopram  Oxalate] Other (See Comments)    Insomnia   . Pristiq [Desvenlafaxine]     Dry heaves/dizzy  . Prozac [Fluoxetine Hcl]     HA and Strange Dreams  . Septra Ds [Sulfamethoxazole-Trimethoprim]     Jitteriness/hyper/affecting sleep  . Penicillins Rash    Childhood rash    Past Medical History:  Diagnosis Date  . Anemia   . Anxiety   . ASCUS on Pap smear   . Bacterial infection   . Bladder spasm   . Blood in stool   . Cancer ([place])    Basal Cell Skin Cancer  . Candida vaginitis   . Cervical dysplasia   . Colon polyp    tubular adenoma  . Depression   . Dizziness 04/05/2016  . Dysmenorrhea   . Endometrial polyp   . Fatigue   . Genital warts   . Hemorrhoids   . Herpes   . History of chicken pox   . HPV (human papilloma virus) infection   . Low blood pressure   . Lyme disease   . Menorrhagia   . Orthostatic hypotension 04/05/2016  . Osteopenia   . Ovarian cyst   . PMDD (premenstrual dysphoric disorder)   . POTS (postural orthostatic tachycardia syndrome) 04/05/2016  . Rectal bleed   . Urinary frequency   . Urine incontinence   . Yeast infection     Family History  Problem Relation Age of Onset  . Prostate cancer Father   . Hypertension Father   . Renal cancer Father   . Migraines Mother   . Alcohol abuse Mother   . Suicidality Mother        deceased  . Colon cancer Paternal Grandfather   . Dementia Paternal Grandmother   . Thyroid disease Maternal Grandmother   . Rheum arthritis Maternal Grandmother   . Diabetes Maternal Grandfather   . Anxiety disorder Sister        x 2    Social History   Socioeconomic History  . Marital status: Married    Spouse name: Jaci Standard   . Number of children: 3  . Years of education: college  . Highest education level: Not on file  Occupational History  . Not on file  Social Needs  . Financial resource strain: Not hard at all  . Food insecurity:    Worry: Never true    Inability: Never true  . Transportation needs:     Medical: No    Non-medical: No  Tobacco Use  . Smoking status: Never Smoker  . Smokeless tobacco: Never Used  Substance and Sexual Activity  . Alcohol use: Yes    Alcohol/week: 0.0 standard drinks    Comment: 1-2 drinks/week  . Drug use: No  . Sexual activity: Yes    Partners: Male    Birth control/protection: None    Comment: vasectony  Lifestyle  . Physical activity:    Days per week: 5 days    Minutes per session: 30 min  . Stress: Not at all  Relationships  .  Social connections:    Talks on phone: More than three times a week    Gets together: More than three times a week    Attends religious service: More than 4 times per year    Active member of club or organization: No    Attends meetings of clubs or organizations: Never    Relationship status: Married  . Intimate partner violence:    Fear of current or ex partner: No    Emotionally abused: No    Physically abused: No    Forced sexual activity: No  Other Topics Concern  . Not on file  Social History Narrative   Drinks 1 cup of coffee a day    M hx multiple SA and died of Suicide at 58 yo.  Her aunt also. Past Medical History, Surgical history, Social history, and Family history were reviewed and updated as appropriate.   Please see review of systems for further details on the patient's review from today.   Objective:   Physical Exam:  There were no vitals taken for this visit.  Physical Exam  Lab Review:     Component Value Date/Time   NA 138 06/18/2018 1821   NA 138 03/31/2016 0932   K 4.2 06/18/2018 1821   CL 104 06/18/2018 1821   CO2 24 06/18/2018 1821   GLUCOSE 134 (H) 06/18/2018 1821   BUN 17 06/18/2018 1821   BUN 13 03/31/2016 0932   CREATININE 0.68 06/18/2018 1821   CALCIUM 9.7 06/18/2018 1821   PROT 7.1 06/18/2018 1821   PROT 7.6 03/31/2016 0932   ALBUMIN 4.6 06/18/2018 1821   ALBUMIN 5.1 03/31/2016 0932   AST 20 06/18/2018 1821   ALT 16 06/18/2018 1821   ALKPHOS 40 06/18/2018 1821    BILITOT 0.6 06/18/2018 1821   BILITOT 0.4 03/31/2016 0932   GFRNONAA >60 06/18/2018 1821   GFRAA >60 06/18/2018 1821       Component Value Date/Time   WBC 7.1 06/18/2018 1821   RBC 4.33 06/18/2018 1821   HGB 13.6 06/18/2018 1821   HCT 40.6 06/18/2018 1821   PLT 309 06/18/2018 1821   MCV 93.8 06/18/2018 1821   MCH 31.4 06/18/2018 1821   MCHC 33.5 06/18/2018 1821   RDW 12.6 06/18/2018 1821   LYMPHSABS 1.2 11/07/2012 0915   MONOABS 0.4 11/07/2012 0915   EOSABS 0.0 11/07/2012 0915   BASOSABS 0.0 11/07/2012 0915    Lithium Lvl  Date Value Ref Range Status  07/14/2018 0.3 (L) 0.6 - 1.2 mmol/L Final    Comment:                                     Detection Limit = 0.1                           <0.1 indicates None Detected      No results found for: PHENYTOIN, PHENOBARB, VALPROATE, CBMZ   .res Assessment: Plan:    Severe episode of recurrent major depressive disorder, without psychotic features ([place]) - Plan: lithium carbonate 300 MG capsule, mirtazapine (REMERON) 30 MG tablet, vortioxetine HBr (TRINTELLIX) 10 MG TABS tablet  Generalized anxiety disorder Greater than 50% of face to face time with patient was spent on counseling and coordination of care. We discussed her severe treatment resistant major depression and anxiety.  We reviewed the long list of failed psychiatric medications  in detail.  Many of which were more intolerances and did not receive adequate trials.  Explained to her in detail that at this point it is very unlikely that we will achieve good control of her symptoms without some degree of side effect.  She is taken all the things with the least side effect risk.  Her symptoms have been very severe and need to be controlled. We reviewed her genetic testing and discussed this. Options, olanzepine, TCA, increase mirtazapine, increase lithium, Spravato.  Discussed each of these options in detail including the side effect risks.  We will proceed with the wants with the  least side effect risks and move up as needed.  Encouraged her to seek a insurance plan which may cover's bravado in the future if needed. Please see After Visit Summary for patient specific instructions. Continue Trintellix and increase mirtazepine 60mg .  This may help offset some of the sleepiness and potentially some of the weight gain.  She is aware this is the maximum dose and we discussed the side effects. Increase lithium to 600 mg pm.  Counseled patient regarding potential benefits, risks, and side effects of lithium to include potential risk of lithium affecting thyroid and renal function.  Discussed need for periodic lab monitoring to determine drug level and to assess for potential adverse effects.  Counseled patient regarding signs and symptoms of lithium toxicity and advised that they notify office immediately or seek urgent medical attention if experiencing these signs and symptoms.  Patient advised to contact office with any questions or concerns. Counseled patient regarding the use of lithium and low doses to prevent suicidality.  She has been provided patient with copy of journal article related to the use of lithium or the prevention of suicidal thoughts.  Discussed that side effect risk is typically minimal with very low doses used to treat chronic suicidal thoughts. We will see her in 4 weeks.  She agreed with the plan. Future Appointments  Date Time Provider [place]  09/20/2018 11:00 AM Cottle, Billey Co., MD CP-CP None    No orders of the defined types were placed in this encounter.     -------------------------------

## 2018-08-17 ENCOUNTER — Other Ambulatory Visit (HOSPITAL_COMMUNITY): Payer: BLUE CROSS/BLUE SHIELD

## 2018-08-18 ENCOUNTER — Other Ambulatory Visit (HOSPITAL_COMMUNITY): Payer: BLUE CROSS/BLUE SHIELD

## 2018-08-21 ENCOUNTER — Other Ambulatory Visit (HOSPITAL_COMMUNITY): Payer: BLUE CROSS/BLUE SHIELD

## 2018-08-22 ENCOUNTER — Other Ambulatory Visit (HOSPITAL_COMMUNITY): Payer: BLUE CROSS/BLUE SHIELD

## 2018-08-23 ENCOUNTER — Other Ambulatory Visit (HOSPITAL_COMMUNITY): Payer: BLUE CROSS/BLUE SHIELD

## 2018-08-24 ENCOUNTER — Other Ambulatory Visit (HOSPITAL_COMMUNITY): Payer: BLUE CROSS/BLUE SHIELD

## 2018-08-25 ENCOUNTER — Other Ambulatory Visit (HOSPITAL_COMMUNITY): Payer: BLUE CROSS/BLUE SHIELD

## 2018-08-28 ENCOUNTER — Other Ambulatory Visit (HOSPITAL_COMMUNITY): Payer: BLUE CROSS/BLUE SHIELD

## 2018-08-29 ENCOUNTER — Other Ambulatory Visit (HOSPITAL_COMMUNITY): Payer: BLUE CROSS/BLUE SHIELD

## 2018-08-30 ENCOUNTER — Other Ambulatory Visit (HOSPITAL_COMMUNITY): Payer: BLUE CROSS/BLUE SHIELD

## 2018-08-31 ENCOUNTER — Other Ambulatory Visit (HOSPITAL_COMMUNITY): Payer: BLUE CROSS/BLUE SHIELD

## 2018-09-01 ENCOUNTER — Other Ambulatory Visit (HOSPITAL_COMMUNITY): Payer: BLUE CROSS/BLUE SHIELD

## 2018-09-20 ENCOUNTER — Ambulatory Visit: Payer: BLUE CROSS/BLUE SHIELD | Admitting: Psychiatry

## 2018-09-20 ENCOUNTER — Encounter: Payer: Self-pay | Admitting: Psychiatry

## 2018-09-20 DIAGNOSIS — F332 Major depressive disorder, recurrent severe without psychotic features: Secondary | ICD-10-CM

## 2018-09-20 DIAGNOSIS — F411 Generalized anxiety disorder: Secondary | ICD-10-CM

## 2018-09-20 DIAGNOSIS — F339 Major depressive disorder, recurrent, unspecified: Secondary | ICD-10-CM

## 2018-09-20 MED ORDER — VORTIOXETINE HBR 20 MG PO TABS: 20.0000 mg | ORAL_TABLET | Freq: Every day | ORAL | 1 refills | Status: DC

## 2018-09-20 MED ORDER — LORAZEPAM 0.5 MG PO TABS: 0.5000 mg | ORAL_TABLET | Freq: Three times a day (TID) | ORAL | 0 refills | Status: DC

## 2018-09-20 MED ORDER — MIRTAZAPINE 30 MG PO TABS: 60.0000 mg | ORAL_TABLET | Freq: Every day | ORAL | 1 refills | Status: DC

## 2018-09-20 NOTE — Progress Notes (Signed)
DHARMA PARE [id number] 01/07/1966 52 y.o.  Subjective:   Patient ID:  Autumn Foster is a 52 y.o. (DOB [birth year]) female.  Chief Complaint:  Chief Complaint  Patient presents with  . Anxiety  . Depression    TR  . Follow-up    med changes  . Medication Problem    the case was also discussed with Thayer Headings, psychiatric nurse practitioner who is been managing the patient for quite some time at last visit HPI  CC: anxiety and depression.  Wanted to change in hopes of finding better med regimen to control her symptoms.   Autumn Foster presents to the office today for follow-up of TR severe, major depression and anxiety. Hosp for SI wtith plan to cut wrists, wrote a sui note and H stopped her on Aug 25 and hosp then until 8/29 and started PHP for 3 weeks. Helped some. Then [facility] did IV infusion #6 from9-17 to 9-26.  Resolved SI and depression much improved.    At last visit we increase the mirtazepine to 60mg  and lithium to 600 to help the depression and anxiety.  Still gaining weight but did have less drowsiness but more anxious with the change from 45 mg/d.  Very unhappy with wt gain.  Did really well with mood for awhile with mood since here.  More peace.  Feels that slipping backwards.  Wants to sleep more.  Eating gives her pleasure but weight gain.  Was underweight to start but now unhappy with gain.,  Some racing thoughts on self, "am I doing ok?"  Fights it with meditation.  Tends to be negative.  Overall more dep and anxious than she was but not needing Ativan.  Did another ketamine infusion Nov 17 without much improvement.  Anxiety better from 7 to 5/10 and creeping up again.  Anx had been unbearable, felt in chest and sternum with weight, heaviness and panicked.  Thoughts were I can't be in my skin.  Can't sit comfortably.  Need to move. Most of thoughts why do I feel this way and how can I stop it and ruminate on it. Depression 02/21/2023.  NO SI nor  death thoughts.  [employer] won't pay for Spravato.   Mirtazapine helped sleep and anxiety. 8 hours.  Overexercises then feels bad with high heart rate.  Reviewed OCD scale with some catastrophisizing and intrusive thought and compulsive checking of kids.  Previous psych med trials include sertraline, fluoxetine, lamotrigine which caused a rash twice, clonazepam which caused her to be sleepy and have a headache, Pristiq which caused her to "jump out of my skin", Lexapro, Abilify with muscle twitches, buspirone uncontrollable crying, citalopram, Seroquel, prepped pramipexole which cause muscle twitching, Nuvigil, Viibryd which was ineffective, Latuda which was ineffective, Deplin, bupropion which increased her anxiety, gabapentin which caused her to feel tired and loopy, hydroxyzine which cause dizziness, duloxetine.  Review of Systems:  Review of Systems  Gastrointestinal: Positive for abdominal distention and constipation. Negative for nausea.  Neurological: Negative for dizziness and tremors.  Psychiatric/Behavioral: Positive for sleep disturbance. Negative for agitation, behavioral problems, confusion, decreased concentration, hallucinations and suicidal ideas. The patient is nervous/anxious. The patient is not hyperactive.     Medications: I have reviewed the patient's current medications.  Current Outpatient Medications  Medication Sig Dispense Refill  . acidophilus (RISAQUAD) CAPS capsule Take 1 capsule by mouth daily. 30 capsule 0  . cetirizine (ZYRTEC) 10 MG tablet Take 10 mg by mouth daily.    Marland Kitchen  levothyroxine (SYNTHROID) 25 MCG tablet 50 mcg.     . liothyronine (CYTOMEL) 5 MCG tablet Take 10 mcg by mouth daily. Take 1 in the morning & 1/2 at bedtime daily    . lithium carbonate 300 MG capsule Take 2 capsules (600 mg total) by mouth at bedtime. 60 capsule 1  . Magnesium Sulfate 70 MG CAPS Take by mouth.    . mirtazapine (REMERON) 30 MG tablet Take 2 tablets (60 mg total) by mouth at  bedtime. 60 tablet 1  . valACYclovir (VALTREX) 500 MG tablet valacyclovir 500 mg tablet  TAKE 1 TABLET BY MOUTH EVERY DAY    . vortioxetine HBr (TRINTELLIX) 10 MG TABS tablet Take 1 tablet (10 mg total) by mouth daily. 30 tablet 1  . progesterone (PROMETRIUM) 100 MG capsule progesterone micronized 100 mg capsule  TAKE THREE CAPSULES BY MOUTH NIGHTLY    . propranolol (INDERAL) 10 MG tablet Take 10 mg by mouth 2 (two) times daily as needed (1-2 bid as needed for anxiety).     No current facility-administered medications for this visit.     Medication Side Effects: Sedation and Other: hungry.  She feels this unsustainable. Internal tremor from the Trintellix.  Allergies:  Allergies  Allergen Reactions  . Acyclovir And Related   . Doxycycline     Fatigue  . Lexapro [Escitalopram Oxalate] Other (See Comments)    Insomnia   . Pristiq [Desvenlafaxine]     Dry heaves/dizzy  . Prozac [Fluoxetine Hcl]     HA and Strange Dreams  . Septra Ds [Sulfamethoxazole-Trimethoprim]     Jitteriness/hyper/affecting sleep  . Penicillins Rash    Childhood rash    Past Medical History:  Diagnosis Date  . Anemia   . Anxiety   . ASCUS on Pap smear   . Bacterial infection   . Bladder spasm   . Blood in stool   . Cancer ([place])    Basal Cell Skin Cancer  . Candida vaginitis   . Cervical dysplasia   . Colon polyp    tubular adenoma  . Depression   . Dizziness 04/05/2016  . Dysmenorrhea   . Endometrial polyp   . Fatigue   . Genital warts   . Hemorrhoids   . Herpes   . History of chicken pox   . HPV (human papilloma virus) infection   . Low blood pressure   . Lyme disease   . Menorrhagia   . Orthostatic hypotension 04/05/2016  . Osteopenia   . Ovarian cyst   . PMDD (premenstrual dysphoric disorder)   . POTS (postural orthostatic tachycardia syndrome) 04/05/2016  . Rectal bleed   . Urinary frequency   . Urine incontinence   . Yeast infection     Family History  Problem Relation Age of  Onset  . Prostate cancer Father   . Hypertension Father   . Renal cancer Father   . Migraines Mother   . Alcohol abuse Mother   . Suicidality Mother        deceased  . Colon cancer Paternal Grandfather   . Dementia Paternal Grandmother   . Thyroid disease Maternal Grandmother   . Rheum arthritis Maternal Grandmother   . Diabetes Maternal Grandfather   . Anxiety disorder Sister        x 2    Social History   Socioeconomic History  . Marital status: Married    Spouse name: Jaci Standard   . Number of children: 3  . Years of education: college  .  Highest education level: Not on file  Occupational History  . Not on file  Social Needs  . Financial resource strain: Not hard at all  . Food insecurity:    Worry: Never true    Inability: Never true  . Transportation needs:    Medical: No    Non-medical: No  Tobacco Use  . Smoking status: Never Smoker  . Smokeless tobacco: Never Used  Substance and Sexual Activity  . Alcohol use: Yes    Alcohol/week: 0.0 standard drinks    Comment: 1-2 drinks/week  . Drug use: No  . Sexual activity: Yes    Partners: Male    Birth control/protection: None    Comment: vasectony  Lifestyle  . Physical activity:    Days per week: 5 days    Minutes per session: 30 min  . Stress: Not at all  Relationships  . Social connections:    Talks on phone: More than three times a week    Gets together: More than three times a week    Attends religious service: More than 4 times per year    Active member of club or organization: No    Attends meetings of clubs or organizations: Never    Relationship status: Married  . Intimate partner violence:    Fear of current or ex partner: No    Emotionally abused: No    Physically abused: No    Forced sexual activity: No  Other Topics Concern  . Not on file  Social History Narrative   Drinks 1 cup of coffee a day    M hx multiple SA and died of Suicide at 70 yo.  Her aunt also. Past Medical History,  Surgical history, Social history, and Family history were reviewed and updated as appropriate.   Please see review of systems for further details on the patient's review from today.   Objective:   Physical Exam:  There were no vitals taken for this visit.  Physical Exam  Constitutional: She is oriented to person, place, and time. She appears well-developed. No distress.  Musculoskeletal: She exhibits no deformity.  Neurological: She is alert and oriented to person, place, and time. She displays no tremor. Coordination and gait normal.  Psychiatric: Her speech is normal and behavior is normal. Judgment and thought content normal. Her mood appears anxious. Her affect is not angry, not blunt, not labile and not inappropriate. Thought content is not paranoid. Cognition and memory are normal. She exhibits a depressed mood. She expresses no homicidal and no suicidal ideation. She expresses no suicidal plans and no homicidal plans.  Insight intact. No auditory or visual hallucinations. No delusions.  She is attentive.    Lab Review:     Component Value Date/Time   NA 138 06/18/2018 1821   NA 138 03/31/2016 0932   K 4.2 06/18/2018 1821   CL 104 06/18/2018 1821   CO2 24 06/18/2018 1821   GLUCOSE 134 (H) 06/18/2018 1821   BUN 17 06/18/2018 1821   BUN 13 03/31/2016 0932   CREATININE 0.68 06/18/2018 1821   CALCIUM 9.7 06/18/2018 1821   PROT 7.1 06/18/2018 1821   PROT 7.6 03/31/2016 0932   ALBUMIN 4.6 06/18/2018 1821   ALBUMIN 5.1 03/31/2016 0932   AST 20 06/18/2018 1821   ALT 16 06/18/2018 1821   ALKPHOS 40 06/18/2018 1821   BILITOT 0.6 06/18/2018 1821   BILITOT 0.4 03/31/2016 0932   GFRNONAA >60 06/18/2018 1821   GFRAA >60 06/18/2018 1821  Component Value Date/Time   WBC 7.1 06/18/2018 1821   RBC 4.33 06/18/2018 1821   HGB 13.6 06/18/2018 1821   HCT 40.6 06/18/2018 1821   PLT 309 06/18/2018 1821   MCV 93.8 06/18/2018 1821   MCH 31.4 06/18/2018 1821   MCHC 33.5  06/18/2018 1821   RDW 12.6 06/18/2018 1821   LYMPHSABS 1.2 11/07/2012 0915   MONOABS 0.4 11/07/2012 0915   EOSABS 0.0 11/07/2012 0915   BASOSABS 0.0 11/07/2012 0915    Lithium Lvl  Date Value Ref Range Status  07/14/2018 0.3 (L) 0.6 - 1.2 mmol/L Final    Comment:                                     Detection Limit = 0.1                           <0.1 indicates None Detected    on 300mg /D  No results found for: PHENYTOIN, PHENOBARB, VALPROATE, CBMZ   .res Assessment: Plan:    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  We reviewed the long list of failed psychiatric medications in detail.  Many of which were more intolerances and did not receive adequate trials.  Explained to her in detail that at this point it is very unlikely that we will achieve good control of her symptoms without some degree of side effect.  She is taken all the things with the least side effect risk.  Her symptoms have been very severe and need to be controlled.  Overall she is better than before the current meds.  We reviewed her genetic testing and discussed this. Options, olanzepine, TCA, increase Trintellix, change to trazodone DT weight, increase lithium, Spravato.  Discussed each of these options in detail including the side effect risks.  We will proceed with the wants with the least side effect risks and move up as needed.  Anxiety may worsen off mirtazepine.  Encouraged her to seek a insurance plan which may cover's bravado in the future if needed. Please see After Visit Summary for patient specific instructions. Continue Trintellix and increase mirtazepine 60mg .  This may help offset some of the sleepiness and potentially some of the weight gain.  She is aware this is the maximum dose and we discussed the side effects. Increase lithium to 600 mg pm.  Counseled patient  regarding potential benefits, risks, and side effects of lithium to include potential risk of lithium affecting thyroid and renal function.  Discussed need for periodic lab monitoring to determine drug level and to assess for potential adverse effects.  Counseled patient regarding signs and symptoms of lithium toxicity and advised that they notify office immediately or seek urgent medical attention if experiencing these signs and symptoms.  Patient advised to contact office with any questions or concerns.  Counseled patient regarding the use of lithium and low doses to prevent suicidality.  She has been provided patient with copy of journal article related to the use of lithium or the prevention of suicidal thoughts.  Discussed that side effect risk is typically minimal with very low doses used to treat chronic suicidal thoughts.  Increase Trintellix to 20 mg daily.  Change mirtzapine to trazodone 50-100 hs.  She wishes to defer to maximize  response.  Switch next time.  This appt was 30 mins.   We will see her in 4 weeks.  She agreed with the plan.  Lynder Parents, MD, DFAPA  No future appointments.  No orders of the defined types were placed in this encounter.     -------------------------------

## 2018-10-07 ENCOUNTER — Other Ambulatory Visit: Payer: Self-pay | Admitting: Psychiatry

## 2018-10-07 DIAGNOSIS — F332 Major depressive disorder, recurrent severe without psychotic features: Secondary | ICD-10-CM

## 2018-10-11 ENCOUNTER — Ambulatory Visit: Payer: BLUE CROSS/BLUE SHIELD | Admitting: Psychiatry

## 2018-10-11 ENCOUNTER — Encounter: Payer: Self-pay | Admitting: Psychiatry

## 2018-10-11 VITALS — BP 102/68 | HR 67 | Wt 162.0 lb

## 2018-10-11 DIAGNOSIS — F411 Generalized anxiety disorder: Secondary | ICD-10-CM | POA: Diagnosis not present

## 2018-10-11 DIAGNOSIS — Z79899 Other long term (current) drug therapy: Secondary | ICD-10-CM

## 2018-10-11 DIAGNOSIS — F339 Major depressive disorder, recurrent, unspecified: Secondary | ICD-10-CM | POA: Diagnosis not present

## 2018-10-11 MED ORDER — TRAZODONE HCL 50 MG PO TABS: 50.0000 mg | ORAL_TABLET | Freq: Every evening | ORAL | 2 refills | Status: DC | PRN

## 2018-10-11 NOTE — Progress Notes (Signed)
Autumn Foster [id number] 09-29-1966 52 y.o.  Subjective:   Patient ID:  Autumn Foster is a 52 y.o. (DOB 1966-09-21) female.  Chief Complaint:  Chief Complaint  Patient presents with  . Follow-up    Medication Management  . Depression  . Anxiety  . Sleeping Problem    CC: anxiety and depression.  Wanted to change in hopes of finding better med regimen to control her symptoms.   Autumn Foster presents to the office today for follow-up of TR severe, major depression and anxiety.  At last visit Trintellix increased to 20mg  daily and lithium to 600 mg.    Depression is better than the last time.  Really concerned about the weight gain.  She feels like it is inching.  Crave food all the time and needs a lot of Miralax and water to manage constipation.Marland Kitchen  H said after going out with friends "I feel like you're back".  Markedly better function and interest and socialization, can laugh again, enjoy activity.  Was so bad before didn't want to be awake.  Never liked sweets and now craves.  Always had good self control with eating.  Anxiety level has been pretty good 2-3/10 now.  Sleep like a rock with meds, drowsy in AM.  No longer feels depressed!   More peace.  Wants to sleep more.  Eating gives her pleasure but weight gain.  Was underweight to start but now unhappy with gain.,  Did another ketamine infusion Nov 17 without much improvement.  [employer] won't pay for Spravato.   Hosp for SI wtith plan to cut wrists, wrote a sui note and H stopped her on Aug 25 and hosp then until 8/29 and started PHP for 3 weeks. Helped some. Then [facility] did IV infusion #6 from9-17 to 9-26.  Resolved SI and depression much improved.   Mirtazapine helped sleep and anxiety. 8 hours.   Overexercises then feels bad with high heart rate.  Reviewed OCD scale with some catastrophisizing and intrusive thought and compulsive checking of kids.  Previous psych med trials include sertraline,  fluoxetine, lamotrigine which caused a rash twice, clonazepam which caused her to be sleepy and have a headache, Pristiq which caused her to "jump out of my skin", Lexapro, Abilify with muscle twitches, buspirone uncontrollable crying, citalopram, Seroquel, prepped pramipexole which cause muscle twitching, Nuvigil, Viibryd which was ineffective, Latuda which was ineffective, Deplin, bupropion which increased her anxiety, gabapentin which caused her to feel tired and loopy, hydroxyzine which cause dizziness, duloxetine, mirazapine 60mg  helped anxiety but cause weight gain.  Review of Systems:  Review of Systems  Gastrointestinal: Positive for constipation. Negative for abdominal distention and nausea.  Neurological: Negative for dizziness and tremors.  Psychiatric/Behavioral: Negative for agitation, behavioral problems, confusion, decreased concentration, hallucinations, sleep disturbance and suicidal ideas. The patient is not nervous/anxious and is not hyperactive.     Medications: I have reviewed the patient's current medications.  Current Outpatient Medications  Medication Sig Dispense Refill  . acidophilus (RISAQUAD) CAPS capsule Take 1 capsule by mouth daily. 30 capsule 0  . cetirizine (ZYRTEC) 10 MG tablet Take 10 mg by mouth daily.    Marland Kitchen levothyroxine (SYNTHROID) 25 MCG tablet 50 mcg.     . liothyronine (CYTOMEL) 5 MCG tablet Take 10 mcg by mouth daily. Take 1 in the morning & 1/2 at bedtime daily    . lithium carbonate 300 MG capsule TAKE 2 CAPSULES (600 MG TOTAL) BY MOUTH AT BEDTIME. 180 capsule 0  .  LORazepam (ATIVAN) 0.5 MG tablet Take 1 tablet (0.5 mg total) by mouth every 8 (eight) hours. 30 tablet 0  . Magnesium Sulfate 70 MG CAPS Take by mouth.    . mirtazapine (REMERON) 30 MG tablet Take 2 tablets (60 mg total) by mouth at bedtime. 60 tablet 1  . valACYclovir (VALTREX) 500 MG tablet valacyclovir 500 mg tablet  TAKE 1 TABLET BY MOUTH EVERY DAY    . vortioxetine HBr (TRINTELLIX) 20  MG TABS tablet Take 1 tablet (20 mg total) by mouth daily. 30 tablet 1  . progesterone (PROMETRIUM) 100 MG capsule progesterone micronized 100 mg capsule  TAKE THREE CAPSULES BY MOUTH NIGHTLY    . propranolol (INDERAL) 10 MG tablet Take 10 mg by mouth 2 (two) times daily as needed (1-2 bid as needed for anxiety).    . traZODone (DESYREL) 50 MG tablet Take 1-2 tablets (50-100 mg total) by mouth at bedtime as needed for sleep. 60 tablet 2   No current facility-administered medications for this visit.     Medication Side Effects: Sedation and Other: hungry.  She feels this unsustainable. Internal tremor from the Trintellix.  Allergies:  Allergies  Allergen Reactions  . Lexapro [Escitalopram Oxalate] Other (See Comments)    Insomnia   . Pristiq [Desvenlafaxine]     Dry heaves/dizzy  . Prozac [Fluoxetine Hcl]     HA and Strange Dreams  . Septra Ds [Sulfamethoxazole-Trimethoprim]     Jitteriness/hyper/affecting sleep  . Penicillins Rash    Childhood rash    Past Medical History:  Diagnosis Date  . Anemia   . Anxiety   . ASCUS on Pap smear   . Bacterial infection   . Bladder spasm   . Blood in stool   . Cancer ([place])    Basal Cell Skin Cancer  . Candida vaginitis   . Cervical dysplasia   . Colon polyp    tubular adenoma  . Depression   . Dizziness 04/05/2016  . Dysmenorrhea   . Endometrial polyp   . Fatigue   . Genital warts   . Hemorrhoids   . Herpes   . History of chicken pox   . HPV (human papilloma virus) infection   . Low blood pressure   . Lyme disease   . Menorrhagia   . Orthostatic hypotension 04/05/2016  . Osteopenia   . Ovarian cyst   . PMDD (premenstrual dysphoric disorder)   . POTS (postural orthostatic tachycardia syndrome) 04/05/2016  . Rectal bleed   . Urinary frequency   . Urine incontinence   . Yeast infection     Family History  Problem Relation Age of Onset  . Prostate cancer Father   . Hypertension Father   . Renal cancer Father   .  Migraines Mother   . Alcohol abuse Mother   . Suicidality Mother        deceased  . Colon cancer Paternal Grandfather   . Dementia Paternal Grandmother   . Thyroid disease Maternal Grandmother   . Rheum arthritis Maternal Grandmother   . Diabetes Maternal Grandfather   . Anxiety disorder Sister        x 2    Social History   Socioeconomic History  . Marital status: Married    Spouse name: Jaci Standard   . Number of children: 3  . Years of education: college  . Highest education level: Not on file  Occupational History  . Not on file  Social Needs  . Financial resource strain: Not hard  at all  . Food insecurity:    Worry: Never true    Inability: Never true  . Transportation needs:    Medical: No    Non-medical: No  Tobacco Use  . Smoking status: Never Smoker  . Smokeless tobacco: Never Used  Substance and Sexual Activity  . Alcohol use: Yes    Alcohol/week: 0.0 standard drinks    Comment: 1-2 drinks/week  . Drug use: No  . Sexual activity: Yes    Partners: Male    Birth control/protection: None    Comment: vasectony  Lifestyle  . Physical activity:    Days per week: 5 days    Minutes per session: 30 min  . Stress: Not at all  Relationships  . Social connections:    Talks on phone: More than three times a week    Gets together: More than three times a week    Attends religious service: More than 4 times per year    Active member of club or organization: No    Attends meetings of clubs or organizations: Never    Relationship status: Married  . Intimate partner violence:    Fear of current or ex partner: No    Emotionally abused: No    Physically abused: No    Forced sexual activity: No  Other Topics Concern  . Not on file  Social History Narrative   Drinks 1 cup of coffee a day    M hx multiple SA and died of Suicide at 75 yo.  Her aunt also. Past Medical History, Surgical history, Social history, and Family history were reviewed and updated as appropriate.    Please see review of systems for further details on the patient's review from today.   Objective:   Physical Exam:  BP 102/68   Pulse 67   Wt 162 lb (73.5 kg)   BMI 21.97 kg/m   Physical Exam Constitutional:      General: She is not in acute distress.    Appearance: Normal appearance. She is well-developed.  Musculoskeletal:        General: No deformity.  Neurological:     Mental Status: She is alert and oriented to person, place, and time.     Motor: No tremor.     Coordination: Coordination normal.     Gait: Gait normal.  Psychiatric:        Attention and Perception: Attention and perception normal. She is attentive.        Mood and Affect: Mood is not anxious or depressed. Affect is not labile, blunt, angry or inappropriate.        Speech: Speech normal.        Behavior: Behavior normal.        Thought Content: Thought content normal. Thought content is not paranoid. Thought content does not include homicidal or suicidal ideation. Thought content does not include homicidal or suicidal plan.        Cognition and Memory: Cognition normal.        Judgment: Judgment normal.     Comments: Insight intact. No auditory or visual hallucinations. No delusions.      Lab Review:     Component Value Date/Time   NA 138 06/18/2018 1821   NA 138 03/31/2016 0932   K 4.2 06/18/2018 1821   CL 104 06/18/2018 1821   CO2 24 06/18/2018 1821   GLUCOSE 134 (H) 06/18/2018 1821   BUN 17 06/18/2018 1821   BUN 13 03/31/2016 0932  CREATININE 0.68 06/18/2018 1821   CALCIUM 9.7 06/18/2018 1821   PROT 7.1 06/18/2018 1821   PROT 7.6 03/31/2016 0932   ALBUMIN 4.6 06/18/2018 1821   ALBUMIN 5.1 03/31/2016 0932   AST 20 06/18/2018 1821   ALT 16 06/18/2018 1821   ALKPHOS 40 06/18/2018 1821   BILITOT 0.6 06/18/2018 1821   BILITOT 0.4 03/31/2016 0932   GFRNONAA >60 06/18/2018 1821   GFRAA >60 06/18/2018 1821       Component Value Date/Time   WBC 7.1 06/18/2018 1821   RBC 4.33  06/18/2018 1821   HGB 13.6 06/18/2018 1821   HCT 40.6 06/18/2018 1821   PLT 309 06/18/2018 1821   MCV 93.8 06/18/2018 1821   MCH 31.4 06/18/2018 1821   MCHC 33.5 06/18/2018 1821   RDW 12.6 06/18/2018 1821   LYMPHSABS 1.2 11/07/2012 0915   MONOABS 0.4 11/07/2012 0915   EOSABS 0.0 11/07/2012 0915   BASOSABS 0.0 11/07/2012 0915    Lithium Lvl  Date Value Ref Range Status  07/14/2018 0.3 (L) 0.6 - 1.2 mmol/L Final    Comment:                                     Detection Limit = 0.1                           <0.1 indicates None Detected    on 300mg /D  No results found for: PHENYTOIN, PHENOBARB, VALPROATE, CBMZ   .res Assessment: Plan:    Recurrent major depression resistant to treatment ([place]) - Plan: Lithium level  Generalized anxiety disorder  Lithium use - Plan: TSH, Basic metabolic panel  RO some OCD   We discussed her severe treatment resistant major depression and anxiety.  We reviewed the long list of failed psychiatric medications in detail.  Many of which were more intolerances and did not receive adequate trials.  Explained to her in detail that at this point it is very unlikely that we will achieve good control of her symptoms without some degree of side effect.  She is taken all the things with the least side effect risk.  Her symptoms have been very severe and need to be controlled.  Overall she is better than before the current meds.  We reviewed her genetic testing and discussed this. Options, olanzepine, TCA, increase Trintellix, change to trazodone DT weight, increase lithium, Spravato.  Discussed each of these options in detail including the side effect risks.  We will proceed with the wants with the least side effect risks and move up as needed.  Anxiety may worsen off mirtazepine.  Disc possibility of off-label use of Trintellix 30 if needed.  Encouraged her to seek a insurance plan which may cover's Spravato in the future if needed.  Cont lithium to 600 mg  pm.  Counseled patient regarding potential benefits, risks, and side effects of lithium to include potential risk of lithium affecting thyroid and renal function.  Discussed need for periodic lab monitoring to determine drug level and to assess for potential adverse effects.  Counseled patient regarding signs and symptoms of lithium toxicity and advised that they notify office immediately or seek urgent medical attention if experiencing these signs and symptoms.  Patient advised to contact office with any questions or concerns.  Check lithium level and BMP and TSH  Counseled patient regarding the use of lithium  and low doses to prevent suicidality.  She has been provided patient with copy of journal article related to the use of lithium or the prevention of suicidal thoughts.  Discussed that side effect risk is typically minimal with very low doses used to treat chronic suicidal thoughts.  continue Trintellix to 20 mg daily.  Change mirtzapine to trazodone 50-100 hs DT weight gain SE   This appt was 30 mins.  We will see her in 4 weeks.  She agreed with the plan.  Lynder Parents, MD, DFAPA  Future Appointments  Date Time Provider [place]  11/22/2018  3:00 PM Cottle, Billey Co., MD [facility] None    Orders Placed This Encounter  Procedures  . Lithium level  . TSH  . Basic metabolic panel      -------------------------------

## 2018-10-11 NOTE — Patient Instructions (Signed)
After holidays, decrease mirtazapine to 1 tablet .  If needed add trazodone 50 mg 1 tablet as needed for sleep. After 1 week, stop mirtazapine and if needed take trazodone for sleep 1 or 2 tablets at bedtime.

## 2018-10-31 ENCOUNTER — Telehealth: Payer: Self-pay | Admitting: Psychiatry

## 2018-10-31 NOTE — Telephone Encounter (Signed)
Pt called stopped Mirtazapine. Started Trazodone.  Having a lot of anxiety. Feel better physically but not mentally. Please advise. Next appt 11/22/2018

## 2018-10-31 NOTE — Telephone Encounter (Signed)
Best not to make a decision for changes until she's seen 10/9 Lynder Parents, MD, DFAPA

## 2018-10-31 NOTE — Telephone Encounter (Signed)
She does have office visit on 11/02/2018

## 2018-10-31 NOTE — Telephone Encounter (Signed)
Pt aware and agrees, did get appt moved up to 1/09

## 2018-11-01 LAB — BASIC METABOLIC PANEL
BUN / CREAT RATIO: 24 — AB (ref 9–23)
BUN: 16 mg/dL (ref 6–24)
CHLORIDE: 98 mmol/L (ref 96–106)
CO2: 25 mmol/L (ref 20–29)
Calcium: 9.9 mg/dL (ref 8.7–10.2)
Creatinine, Ser: 0.67 mg/dL (ref 0.57–1.00)
GFR calc non Af Amer: 101 mL/min/{1.73_m2} (ref >59)
GFR, EST AFRICAN AMERICAN: 116 mL/min/{1.73_m2} (ref >59)
Glucose: 88 mg/dL (ref 65–99)
Potassium: 4.4 mmol/L (ref 3.5–5.2)
SODIUM: 137 mmol/L (ref 134–144)

## 2018-11-01 LAB — LITHIUM LEVEL: LITHIUM LVL: 0.4 mmol/L — AB (ref 0.6–1.2)

## 2018-11-01 LAB — TSH: TSH: 0.246 u[IU]/mL — AB (ref 0.450–4.500)

## 2018-11-02 ENCOUNTER — Encounter: Payer: Self-pay | Admitting: Psychiatry

## 2018-11-02 ENCOUNTER — Ambulatory Visit: Payer: BLUE CROSS/BLUE SHIELD | Admitting: Psychiatry

## 2018-11-02 VITALS — BP 103/63 | HR 77

## 2018-11-02 DIAGNOSIS — F332 Major depressive disorder, recurrent severe without psychotic features: Secondary | ICD-10-CM | POA: Diagnosis not present

## 2018-11-02 DIAGNOSIS — Z79899 Other long term (current) drug therapy: Secondary | ICD-10-CM

## 2018-11-02 DIAGNOSIS — F339 Major depressive disorder, recurrent, unspecified: Secondary | ICD-10-CM | POA: Diagnosis not present

## 2018-11-02 DIAGNOSIS — F411 Generalized anxiety disorder: Secondary | ICD-10-CM

## 2018-11-02 MED ORDER — LITHIUM CARBONATE 300 MG PO CAPS: 900.0000 mg | ORAL_CAPSULE | Freq: Every day | ORAL | 0 refills | Status: DC

## 2018-11-02 NOTE — Progress Notes (Signed)
Autumn Foster [id number] 1965/11/02 53 y.o.  Subjective:   Patient ID:  Autumn Foster is a 53 y.o. (DOB 1966/08/23) female.  Chief Complaint:  Chief Complaint  Patient presents with  . Follow-up    Medication management  . Medication Problem    CC: anxiety and depression.  Wanted to change in hopes of finding better med regimen to control her symptoms.   Autumn Foster presents to the office today for follow-up of TR severe, major depression and anxiety.  Last seen 12/8 and we changed from Remeron to trazodone bc weight gain issues and constipation, bloating.  Felt fine for awhile.  Less food cravings and less constipation, but the anxiety and rumination is worse. Depression slipping back on her but not severe.  Also feels a little loopy.  Easier to get out of bed in am with trazodone.  Didn't like weight on mirtazapine.  Still exercises and strict with carbs diet.  When on mirtazapine concerned about the weight gain.  She feels like it is inching.  Crave food all the time and needs a lot of Miralax and water to manage constipation..  Did another ketamine infusion Nov 17 without much improvement.  [employer] won't pay for Spravato.   Hosp for SI wtith plan to cut wrists, wrote a sui note and H stopped her on Aug 25 and hosp then until 8/29 and started PHP for 3 weeks. Helped some. Then [place] did IV infusion #6 from9-17 to 9-26.  Resolved SI and depression much improved.   Mirtazapine helped sleep and anxiety. 8 hours.   Overexercises then feels bad with high heart rate.  Reviewed OCD scale with some catastrophisizing and intrusive thought and compulsive checking of kids.  Previous psych med trials include sertraline, fluoxetine, lamotrigine which caused a rash twice, clonazepam which caused her to be sleepy and have a headache, Pristiq which caused her to "jump out of my skin", Lexapro, Abilify with muscle twitches, buspirone uncontrollable crying, citalopram,  Seroquel, prepped pramipexole which cause muscle twitching, Nuvigil, Viibryd which was ineffective, Latuda which was ineffective, Deplin, bupropion which increased her anxiety, gabapentin which caused her to feel tired and loopy, hydroxyzine which cause dizziness, duloxetine, mirazapine 60mg  helped anxiety but cause weight gain.  Review of Systems:  Review of Systems  Gastrointestinal: Positive for constipation. Negative for abdominal distention and nausea.  Neurological: Negative for dizziness and tremors.  Psychiatric/Behavioral: Negative for agitation, behavioral problems, confusion, decreased concentration, hallucinations, sleep disturbance and suicidal ideas. The patient is not nervous/anxious and is not hyperactive.     Medications: I have reviewed the patient's current medications.  Current Outpatient Medications  Medication Sig Dispense Refill  . acidophilus (RISAQUAD) CAPS capsule Take 1 capsule by mouth daily. 30 capsule 0  . cetirizine (ZYRTEC) 10 MG tablet Take 10 mg by mouth daily.    Marland Kitchen levothyroxine (SYNTHROID) 25 MCG tablet 50 mcg.     . liothyronine (CYTOMEL) 5 MCG tablet Take 10 mcg by mouth daily. Take 1 in the morning & 1/2 at bedtime daily    . lithium carbonate 300 MG capsule Take 3 capsules (900 mg total) by mouth at bedtime. 270 capsule 0  . LORazepam (ATIVAN) 0.5 MG tablet Take 1 tablet (0.5 mg total) by mouth every 8 (eight) hours. 30 tablet 0  . Magnesium Sulfate 70 MG CAPS Take by mouth.    . traZODone (DESYREL) 50 MG tablet Take 1-2 tablets (50-100 mg total) by mouth at bedtime as needed for  sleep. 60 tablet 2  . valACYclovir (VALTREX) 500 MG tablet valacyclovir 500 mg tablet  TAKE 1 TABLET BY MOUTH EVERY DAY    . vortioxetine HBr (TRINTELLIX) 20 MG TABS tablet Take 1 tablet (20 mg total) by mouth daily. 30 tablet 1  . mirtazapine (REMERON) 30 MG tablet Take 2 tablets (60 mg total) by mouth at bedtime. (Patient not taking: Reported on 11/02/2018) 60 tablet 1  .  progesterone (PROMETRIUM) 100 MG capsule progesterone micronized 100 mg capsule  TAKE THREE CAPSULES BY MOUTH NIGHTLY    . propranolol (INDERAL) 10 MG tablet Take 10 mg by mouth 2 (two) times daily as needed (1-2 bid as needed for anxiety).     No current facility-administered medications for this visit.     Medication Side Effects: Sedation and Other: hungry.  She feels this unsustainable. Internal tremor from the Trintellix.  Allergies:  Allergies  Allergen Reactions  . Lexapro [Escitalopram Oxalate] Other (See Comments)    Insomnia   . Pristiq [Desvenlafaxine]     Dry heaves/dizzy  . Prozac [Fluoxetine Hcl]     HA and Strange Dreams  . Septra Ds [Sulfamethoxazole-Trimethoprim]     Jitteriness/hyper/affecting sleep  . Penicillins Rash    Childhood rash    Past Medical History:  Diagnosis Date  . Anemia   . Anxiety   . ASCUS on Pap smear   . Bacterial infection   . Bladder spasm   . Blood in stool   . Cancer ([place])    Basal Cell Skin Cancer  . Candida vaginitis   . Cervical dysplasia   . Colon polyp    tubular adenoma  . Depression   . Dizziness 04/05/2016  . Dysmenorrhea   . Endometrial polyp   . Fatigue   . Genital warts   . Hemorrhoids   . Herpes   . History of chicken pox   . HPV (human papilloma virus) infection   . Low blood pressure   . Lyme disease   . Menorrhagia   . Orthostatic hypotension 04/05/2016  . Osteopenia   . Ovarian cyst   . PMDD (premenstrual dysphoric disorder)   . POTS (postural orthostatic tachycardia syndrome) 04/05/2016  . Rectal bleed   . Urinary frequency   . Urine incontinence   . Yeast infection     Family History  Problem Relation Age of Onset  . Prostate cancer Father   . Hypertension Father   . Renal cancer Father   . Migraines Mother   . Alcohol abuse Mother   . Suicidality Mother        deceased  . Colon cancer Paternal Grandfather   . Dementia Paternal Grandmother   . Thyroid disease Maternal Grandmother   .  Rheum arthritis Maternal Grandmother   . Diabetes Maternal Grandfather   . Anxiety disorder Sister        x 2    Social History   Socioeconomic History  . Marital status: Married    Spouse name: Jaci Standard   . Number of children: 3  . Years of education: college  . Highest education level: Not on file  Occupational History  . Not on file  Social Needs  . Financial resource strain: Not hard at all  . Food insecurity:    Worry: Never true    Inability: Never true  . Transportation needs:    Medical: No    Non-medical: No  Tobacco Use  . Smoking status: Never Smoker  . Smokeless tobacco: Never  Used  Substance and Sexual Activity  . Alcohol use: Yes    Alcohol/week: 0.0 standard drinks    Comment: 1-2 drinks/week  . Drug use: No  . Sexual activity: Yes    Partners: Male    Birth control/protection: None    Comment: vasectony  Lifestyle  . Physical activity:    Days per week: 5 days    Minutes per session: 30 min  . Stress: Not at all  Relationships  . Social connections:    Talks on phone: More than three times a week    Gets together: More than three times a week    Attends religious service: More than 4 times per year    Active member of club or organization: No    Attends meetings of clubs or organizations: Never    Relationship status: Married  . Intimate partner violence:    Fear of current or ex partner: No    Emotionally abused: No    Physically abused: No    Forced sexual activity: No  Other Topics Concern  . Not on file  Social History Narrative   Drinks 1 cup of coffee a day    M hx multiple SA and died of Suicide at 57 yo.  Her aunt also. Past Medical History, Surgical history, Social history, and Family history were reviewed and updated as appropriate.   Please see review of systems for further details on the patient's review from today.   Objective:   Physical Exam:  BP 103/63   Pulse 77   Physical Exam Constitutional:      General: She is  not in acute distress.    Appearance: Normal appearance. She is well-developed.  Musculoskeletal:        General: No deformity.  Neurological:     Mental Status: She is alert and oriented to person, place, and time.     Motor: No tremor.     Coordination: Coordination normal.     Gait: Gait normal.  Psychiatric:        Attention and Perception: Attention and perception normal. She is attentive.        Mood and Affect: Mood is anxious and depressed. Affect is not labile, blunt, angry or inappropriate.        Speech: Speech normal.        Behavior: Behavior normal.        Thought Content: Thought content normal. Thought content is not paranoid. Thought content does not include homicidal or suicidal ideation. Thought content does not include homicidal or suicidal plan.        Cognition and Memory: Cognition normal.        Judgment: Judgment normal.     Comments: Insight intact. No auditory or visual hallucinations. No delusions.      Lab Review:     Component Value Date/Time   NA 137 10/31/2018 1422   K 4.4 10/31/2018 1422   CL 98 10/31/2018 1422   CO2 25 10/31/2018 1422   GLUCOSE 88 10/31/2018 1422   GLUCOSE 134 (H) 06/18/2018 1821   BUN 16 10/31/2018 1422   CREATININE 0.67 10/31/2018 1422   CALCIUM 9.9 10/31/2018 1422   PROT 7.1 06/18/2018 1821   PROT 7.6 03/31/2016 0932   ALBUMIN 4.6 06/18/2018 1821   ALBUMIN 5.1 03/31/2016 0932   AST 20 06/18/2018 1821   ALT 16 06/18/2018 1821   ALKPHOS 40 06/18/2018 1821   BILITOT 0.6 06/18/2018 1821   BILITOT 0.4 03/31/2016 0932  GFRNONAA 101 10/31/2018 1422   GFRAA 116 10/31/2018 1422       Component Value Date/Time   WBC 7.1 06/18/2018 1821   RBC 4.33 06/18/2018 1821   HGB 13.6 06/18/2018 1821   HCT 40.6 06/18/2018 1821   PLT 309 06/18/2018 1821   MCV 93.8 06/18/2018 1821   MCH 31.4 06/18/2018 1821   MCHC 33.5 06/18/2018 1821   RDW 12.6 06/18/2018 1821   LYMPHSABS 1.2 11/07/2012 0915   MONOABS 0.4 11/07/2012 0915    EOSABS 0.0 11/07/2012 0915   BASOSABS 0.0 11/07/2012 0915    Lithium Lvl  Date Value Ref Range Status  10/31/2018 0.4 (L) 0.6 - 1.2 mmol/L Final    Comment:                                     Detection Limit = 0.1                           <0.1 indicates None Detected    on 600mg /D  No results found for: PHENYTOIN, PHENOBARB, VALPROATE, CBMZ   .res Assessment: Plan:    Recurrent major depression resistant to treatment ([place])  Generalized anxiety disorder  Lithium use  Severe episode of recurrent major depressive disorder, without psychotic features ([place]) - Plan: lithium carbonate 300 MG capsule  RO some OCD   We discussed her severe treatment resistant major depression and anxiety.  We reviewed the long list of failed psychiatric medications in detail.  Many of which were more intolerances and did not receive adequate trials.  Explained to her in detail that at this point it is very unlikely that we will achieve good control of her symptoms without some degree of side effect.  She is taken all the things with the least side effect risk.  Her symptoms have been very severe and need to be controlled.  Overall she is better than before the current meds.  However , she is clearly worse since discontinuing mirtazapine.  We discussed the various options.  She is very concerned about weight gain with mirtazapine.  We will try increasing the lithium in hopes of not having return to mirtazapine.  We discussed the blood level issues with lithium.  We also discussed the low TSH.  We will do without later once her depression is under good control because the Cytomel may be helping her depression though it may be suppressing the TSH.  Split trazodone to 75 mgHS.  Increase lithium to 900 mg daily.  If it doesn't work in a couple of weeks, then returning to mirtazapine 45 mg.  Can fight the weight gain if needed.  She agrees to this plan.  Her H will not want her togain weight  either.  Counseled patient regarding potential benefits, risks, and side effects of lithium to include potential risk of lithium affecting thyroid and renal function.  Discussed need for periodic lab monitoring to determine drug level and to assess for potential adverse effects.  Counseled patient regarding signs and symptoms of lithium toxicity and advised that they notify office immediately or seek urgent medical attention if experiencing these signs and symptoms.  Patient advised to contact office with any questions or concerns.  Other Options, olanzepine, TCA, increase Trintellix, change to trazodone DT weight, increase lithium, Spravato.  Discussed each of these options in detail including the side effect risks.  We will proceed with the wants with the least side effect risks and move up as needed.  Anxiety may worsen off mirtazepine.  Disc possibility of off-label use of Trintellix 30 if needed.  Encouraged her to seek a insurance plan which may cover's Spravato in the future if needed.  continue Trintellix to 20 mg daily.  This appt was 30 mins.  We will see her in 4 weeks.  She agreed with the plan.  Lynder Parents, MD, DFAPA  Future Appointments  Date Time Provider [place]  12/13/2018 10:30 AM Cottle, Billey Co., MD [facility] None    No orders of the defined types were placed in this encounter.     -------------------------------

## 2018-11-04 ENCOUNTER — Other Ambulatory Visit: Payer: Self-pay | Admitting: Psychiatry

## 2018-11-05 ENCOUNTER — Other Ambulatory Visit: Payer: Self-pay | Admitting: Psychiatry

## 2018-11-16 ENCOUNTER — Ambulatory Visit: Payer: BLUE CROSS/BLUE SHIELD | Admitting: Psychiatry

## 2018-11-22 ENCOUNTER — Ambulatory Visit: Payer: BLUE CROSS/BLUE SHIELD | Admitting: Psychiatry

## 2018-11-24 ENCOUNTER — Encounter

## 2018-12-13 ENCOUNTER — Ambulatory Visit: Payer: BLUE CROSS/BLUE SHIELD | Admitting: Psychiatry

## 2018-12-13 ENCOUNTER — Encounter: Payer: Self-pay | Admitting: Psychiatry

## 2018-12-13 VITALS — BP 113/69 | HR 91

## 2018-12-13 DIAGNOSIS — F339 Major depressive disorder, recurrent, unspecified: Secondary | ICD-10-CM

## 2018-12-13 DIAGNOSIS — F411 Generalized anxiety disorder: Secondary | ICD-10-CM | POA: Diagnosis not present

## 2018-12-13 DIAGNOSIS — G251 Drug-induced tremor: Secondary | ICD-10-CM | POA: Diagnosis not present

## 2018-12-13 NOTE — Progress Notes (Signed)
Autumn Foster [id number] [birth year] 53 y.o.  Subjective:   Patient ID:  Autumn Foster is a 53 y.o. (DOB [birth year]) female.  Chief Complaint:  Chief Complaint  Patient presents with  . Follow-up    Medication Mangement  . Medication Problem    tremors from lithium   Last seen January 9  CC: anxiety and depression.  Wanted to change in hopes of finding better med regimen to control her symptoms.   Autumn Foster presents to the office today for follow-up of TR severe, major depression and anxiety.  Last visit increased lithium to 900 mg and had tremor after 4 days and reduced the dosage. Mood OK.  Better at Christmas.  Autumn Foster struggling with negative thoughts.  No SI.  Able to function.  Having to measure food to keep from gaining.  Exercise daily.  Autumn Foster about food.  Much better than back in the summer.  Always  Really tired and axiou s about food.  Job interview today and fears can't get up to work by 8 AM for work.  Hangover from mirtazapine after 8 hours.  Enjoys family but kids out of house usually.  Prior changed from Remeron to trazodone bc hangover, weight gain issues and constipation, bloating.  Felt fine for awhile.  Less food cravings and less constipation, but the anxiety and rumination is worse. Depression was slipping back on her but not severe.  Also feels a little loopy.  Easier to get out of bed in am with trazodone.  Didn't like weight on mirtazapine.  Still exercises and strict with carbs diet.  Joints hurt on trazadone  When on mirtazapine concerned about the weight gain.  She feels like it is inching.  Crave food all the time and needs a lot of Miralax and water to manage constipation..  Did another ketamine infusion Nov 17 without much improvement.  [employer] won't pay for Spravato.   Hosp for SI wtith plan to cut wrists, wrote a sui note and H stopped her on Aug 25 and hosp then until 8/29 and started PHP for 3 weeks. Helped some. Then [facility]  did IV infusion #6 from9-17 to 9-26.  Resolved SI and depression much improved.   Mirtazapine helped sleep and anxiety. 8 hours.  Overexercises then feels bad with high heart rate.  Reviewed OCD scale with some catastrophisizing and intrusive thought and compulsive checking of kids.  Previous psych med trials include sertraline, fluoxetine, lamotrigine which caused a rash twice, clonazepam which caused her to be sleepy and have a headache, Pristiq which caused her to "jump out of my skin", Lexapro, Abilify with muscle twitches, buspirone uncontrollable crying, citalopram, Seroquel, prepped pramipexole which cause muscle twitching, Nuvigil, Viibryd which was ineffective, Latuda which was ineffective, Deplin, bupropion which increased her anxiety, gabapentin which caused her to feel tired and loopy, hydroxyzine which cause dizziness, duloxetine, mirazapine 60mg  helped anxiety but cause weight gain, trazodone joint pain, lithium.  Review of Systems:  Review of Systems  Gastrointestinal: Positive for constipation. Negative for abdominal distention and nausea.  Neurological: Negative for dizziness and tremors.  Psychiatric/Behavioral: Negative for agitation, behavioral problems, confusion, decreased concentration, hallucinations, sleep disturbance and suicidal ideas. The patient is not nervous/anxious and is not hyperactive.     Medications: I have reviewed the patient's current medications.  Current Outpatient Medications  Medication Sig Dispense Refill  . cetirizine (ZYRTEC) 10 MG tablet Take 10 mg by mouth daily.    Marland Kitchen levothyroxine (SYNTHROID) 25 MCG tablet  50 mcg.     . liothyronine (CYTOMEL) 5 MCG tablet Take 5 mcg by mouth 2 (two) times daily.     Marland Kitchen lithium carbonate 300 MG capsule Take 3 capsules (900 mg total) by mouth at bedtime. (Patient taking differently: Take 600 mg by mouth at bedtime. ) 270 capsule 0  . LORazepam (ATIVAN) 0.5 MG tablet Take 1 tablet (0.5 mg total) by mouth every 8  (eight) hours. 30 tablet 0  . Magnesium Sulfate 70 MG CAPS Take by mouth.    . mirtazapine (REMERON) 30 MG tablet Take 2 tablets (60 mg total) by mouth at bedtime. 60 tablet 1  . TRINTELLIX 20 MG TABS tablet TAKE 1 TABLET BY MOUTH EVERY DAY 30 tablet 2  . valACYclovir (VALTREX) 500 MG tablet valacyclovir 500 mg tablet  TAKE 1 TABLET BY MOUTH EVERY DAY    . propranolol (INDERAL) 10 MG tablet Take 10 mg by mouth 2 (two) times daily as needed (1-2 bid as needed for anxiety).     No current facility-administered medications for this visit.     Medication Side Effects: Sedation and Other: hungry.  She feels this unsustainable. Internal tremor from the Trintellix.  Allergies:  Allergies  Allergen Reactions  . Lexapro [Escitalopram Oxalate] Other (See Comments)    Insomnia   . Pristiq [Desvenlafaxine]     Dry heaves/dizzy  . Prozac [Fluoxetine Hcl]     HA and Strange Dreams  . Septra Ds [Sulfamethoxazole-Trimethoprim]     Jitteriness/hyper/affecting sleep  . Penicillins Rash    Childhood rash    Past Medical History:  Diagnosis Date  . Anemia   . Anxiety   . ASCUS on Pap smear   . Bacterial infection   . Bladder spasm   . Blood in stool   . Cancer ([place])    Basal Cell Skin Cancer  . Candida vaginitis   . Cervical dysplasia   . Colon polyp    tubular adenoma  . Depression   . Dizziness 04/05/2016  . Dysmenorrhea   . Endometrial polyp   . Fatigue   . Genital warts   . Hemorrhoids   . Herpes   . History of chicken pox   . HPV (human papilloma virus) infection   . Low blood pressure   . Lyme disease   . Menorrhagia   . Orthostatic hypotension 04/05/2016  . Osteopenia   . Ovarian cyst   . PMDD (premenstrual dysphoric disorder)   . POTS (postural orthostatic tachycardia syndrome) 04/05/2016  . Rectal bleed   . Urinary frequency   . Urine incontinence   . Yeast infection     Family History  Problem Relation Age of Onset  . Prostate cancer Father   . Hypertension  Father   . Renal cancer Father   . Migraines Mother   . Alcohol abuse Mother   . Suicidality Mother        deceased  . Colon cancer Paternal Grandfather   . Dementia Paternal Grandmother   . Thyroid disease Maternal Grandmother   . Rheum arthritis Maternal Grandmother   . Diabetes Maternal Grandfather   . Anxiety disorder Sister        x 2    Social History   Socioeconomic History  . Marital status: Married    Spouse name: Autumn Foster   . Number of children: 3  . Years of education: college  . Highest education level: Not on file  Occupational History  . Not on file  Social  Needs  . Financial resource strain: Not hard at all  . Food insecurity:    Worry: Never true    Inability: Never true  . Transportation needs:    Medical: No    Non-medical: No  Tobacco Use  . Smoking status: Never Smoker  . Smokeless tobacco: Never Used  Substance and Sexual Activity  . Alcohol use: Yes    Alcohol/week: 0.0 Foster drinks    Comment: 1-2 drinks/week  . Drug use: No  . Sexual activity: Yes    Partners: Male    Birth control/protection: None    Comment: vasectony  Lifestyle  . Physical activity:    Days per week: 5 days    Minutes per session: 30 min  . Stress: Not at all  Relationships  . Social connections:    Talks on phone: More than three times a week    Gets together: More than three times a week    Attends religious service: More than 4 times per year    Active member of club or organization: No    Attends meetings of clubs or organizations: Never    Relationship status: Married  . Intimate partner violence:    Fear of current or ex partner: No    Emotionally abused: No    Physically abused: No    Forced sexual activity: No  Other Topics Concern  . Not on file  Social History Narrative   Drinks 1 cup of coffee a day    M hx multiple SA and died of Suicide at 81 yo.  Her aunt also. Past Medical History, Surgical history, Social history, and Family history were  reviewed and updated as appropriate.   Please see review of systems for further details on the patient's review from today.   Objective:   Physical Exam:  BP 113/69 (BP Location: Left Arm)   Pulse 91   Physical Exam Constitutional:      General: She is not in acute distress.    Appearance: Normal appearance. She is well-developed.  Musculoskeletal:        General: No deformity.  Neurological:     Mental Status: She is alert and oriented to person, place, and time.     Motor: No tremor.     Coordination: Coordination normal.     Gait: Gait normal.  Psychiatric:        Attention and Perception: Attention and perception normal. She is attentive.        Mood and Affect: Mood is anxious and depressed. Affect is not labile, blunt, angry or inappropriate.        Speech: Speech normal.        Behavior: Behavior normal.        Thought Content: Thought content normal. Thought content is not paranoid. Thought content does not include homicidal or suicidal ideation. Thought content does not include homicidal or suicidal plan.        Cognition and Memory: Cognition normal.        Judgment: Judgment normal.     Comments: Insight intact. No auditory or visual hallucinations. No delusions.      Lab Review:     Component Value Date/Time   NA 137 10/31/2018 1422   K 4.4 10/31/2018 1422   CL 98 10/31/2018 1422   CO2 25 10/31/2018 1422   GLUCOSE 88 10/31/2018 1422   GLUCOSE 134 (H) 06/18/2018 1821   BUN 16 10/31/2018 1422   CREATININE 0.67 10/31/2018 1422   CALCIUM  9.9 10/31/2018 1422   PROT 7.1 06/18/2018 1821   PROT 7.6 03/31/2016 0932   ALBUMIN 4.6 06/18/2018 1821   ALBUMIN 5.1 03/31/2016 0932   AST 20 06/18/2018 1821   ALT 16 06/18/2018 1821   ALKPHOS 40 06/18/2018 1821   BILITOT 0.6 06/18/2018 1821   BILITOT 0.4 03/31/2016 0932   GFRNONAA 101 10/31/2018 1422   GFRAA 116 10/31/2018 1422       Component Value Date/Time   WBC 7.1 06/18/2018 1821   RBC 4.33 06/18/2018 1821    HGB 13.6 06/18/2018 1821   HCT 40.6 06/18/2018 1821   PLT 309 06/18/2018 1821   MCV 93.8 06/18/2018 1821   MCH 31.4 06/18/2018 1821   MCHC 33.5 06/18/2018 1821   RDW 12.6 06/18/2018 1821   LYMPHSABS 1.2 11/07/2012 0915   MONOABS 0.4 11/07/2012 0915   EOSABS 0.0 11/07/2012 0915   BASOSABS 0.0 11/07/2012 0915    Lithium Lvl  Date Value Ref Range Status  10/31/2018 0.4 (L) 0.6 - 1.2 mmol/L Final    Comment:                                     Detection Limit = 0.1                           <0.1 indicates None Detected    on 600mg /D  No results found for: PHENYTOIN, PHENOBARB, VALPROATE, CBMZ   .res Assessment: Plan:    Recurrent major depression resistant to treatment ([place])  Generalized anxiety disorder  Lithium-induced tremor  RO some OCD   We discussed her severe treatment resistant major depression and anxiety.  We reviewed the Autumn list of failed psychiatric medications in detail.  Many of which were more intolerances and did not receive adequate trials.  Explained to her in detail that at this point it is very unlikely that we will achieve good control of her symptoms without some degree of side effect.  She is taken all the things with the least side effect risk.  Her symptoms have been very severe and need to be controlled.  Overall she is better than before the current meds.  However , she is clearly worsened off mirtazapine.  She doubts benefit from lithium.  Mirtazapine is helping.  Somewhat obsessive about weight complicates options.  Option lithium 900 with propranolol to manage tremor.  We discussed the various options.  She is very concerned about weight gain with mirtazapine.  We will try increasing the lithium in hopes of not having return to mirtazapine.  We discussed the blood level issues with lithium.  We also discussed the low TSH.  We will do without later once her depression is under good control because the Cytomel may be helping her depression though  it may be suppressing the TSH.  Continue DBT  Can fight the weight gain if needed.  She agrees to this plan.  Her H will not want her togain weight either.  Counseled patient regarding potential benefits, risks, and side effects of lithium to include potential risk of lithium affecting thyroid and renal function.  Discussed need for periodic lab monitoring to determine drug level and to assess for potential adverse effects.  Counseled patient regarding signs and symptoms of lithium toxicity and advised that they notify office immediately or seek urgent medical attention if experiencing these signs and symptoms.  Patient advised to contact office with any questions or concerns.  Other Options, olanzepine, TCA, MAOI in detail, increase Trintellix, change to trazodone DT weight, increase lithium, Spravato, Vraylar.  Discussed each of these options in detail including the side effect risks.  We will proceed with the wants with the least side effect risks and move up as needed.  Anxiety may worsen off mirtazepine.  Disc possibility of off-label use of Trintellix 30 if needed.  Encouraged her to seek a insurance plan which may cover's Spravato in the future if needed.  Increase Trintellix to 30 mg daily above the usual max dosage for TRD.  This appt was 30 mins.  We will see her in 6 weeks.  She agreed with the plan.  Lynder Parents, MD, DFAPA  No future appointments.  No orders of the defined types were placed in this encounter.     -------------------------------  HPI

## 2018-12-25 ENCOUNTER — Other Ambulatory Visit: Payer: Self-pay | Admitting: Psychiatry

## 2018-12-25 DIAGNOSIS — F332 Major depressive disorder, recurrent severe without psychotic features: Secondary | ICD-10-CM

## 2018-12-29 ENCOUNTER — Telehealth: Payer: Self-pay | Admitting: Psychiatry

## 2018-12-29 ENCOUNTER — Other Ambulatory Visit: Payer: Self-pay | Admitting: Psychiatry

## 2018-12-29 MED ORDER — PHENTERMINE HCL 15 MG PO CAPS: 15.0000 mg | ORAL_CAPSULE | ORAL | 1 refills | Status: DC

## 2018-12-29 NOTE — Progress Notes (Signed)
We have discussed that mirtazapine seems to be the only thing that keeps her depression under control yet causes weight gain.  Switching to alternatives has been tried and failed.  Therefore it is reasonable to offer low-dose phentermine to help counteract the weight gain at least for now.  We have discussed that this could cause some anxiety.  So we will start with a low dosage.  15 mg every morning

## 2018-12-29 NOTE — Telephone Encounter (Signed)
Patient left vm @3 :14 stated that she discussed with Dr. Clovis Pu about getting a appetite suppressant, patient would like one called into the pharmacy on file

## 2019-01-04 ENCOUNTER — Other Ambulatory Visit: Payer: Self-pay

## 2019-01-04 DIAGNOSIS — F332 Major depressive disorder, recurrent severe without psychotic features: Secondary | ICD-10-CM

## 2019-01-04 MED ORDER — LITHIUM CARBONATE 300 MG PO CAPS: 600.0000 mg | ORAL_CAPSULE | Freq: Every day | ORAL | 0 refills | Status: DC

## 2019-01-21 ENCOUNTER — Other Ambulatory Visit: Payer: Self-pay | Admitting: Psychiatry

## 2019-01-21 DIAGNOSIS — F332 Major depressive disorder, recurrent severe without psychotic features: Secondary | ICD-10-CM

## 2019-01-24 ENCOUNTER — Ambulatory Visit: Payer: BLUE CROSS/BLUE SHIELD | Admitting: Psychiatry

## 2019-01-24 ENCOUNTER — Encounter: Payer: Self-pay | Admitting: Psychiatry

## 2019-01-24 DIAGNOSIS — G251 Drug-induced tremor: Secondary | ICD-10-CM

## 2019-01-24 DIAGNOSIS — Z79899 Other long term (current) drug therapy: Secondary | ICD-10-CM | POA: Diagnosis not present

## 2019-01-24 DIAGNOSIS — F339 Major depressive disorder, recurrent, unspecified: Secondary | ICD-10-CM

## 2019-01-24 DIAGNOSIS — F411 Generalized anxiety disorder: Secondary | ICD-10-CM

## 2019-01-24 MED ORDER — VORTIOXETINE HBR 10 MG PO TABS: 10.0000 mg | ORAL_TABLET | Freq: Every day | ORAL | 1 refills | Status: DC

## 2019-01-24 NOTE — Progress Notes (Signed)
Autumn Foster [id number] 1965-11-24 53 y.o.  Subjective:   Patient ID:  Autumn Foster is a 53 y.o. (DOB August 17, 1966) female.  Chief Complaint:  Chief Complaint  Patient presents with  . Depression  . Medication Problem    weight gain  . Follow-up    med change    Autumn Foster presents to the office today for follow-up of TR severe, major depression and anxiety.  Last seen December 13, 2018.  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  She is trying to be able to wean off mirtazapine because of weight gain but this always resulted in depression when she stops it.  After that visit she called back and asked for an appetite suppressant because of the difficulty managing the hunger associated with mirtazapine.  I did agree to add phentermine 15 mg every morning.  Not depressed.  Working helps.  Patient reports stable mood and denies depressed or irritable moods.  Patient denies any recent difficulty with anxiety.  Patient denies difficulty with sleep initiation or maintenance. Denies appetite disturbance.  Patient reports that energy and motivation have been good.  Patient denies any difficulty with concentration.  Patient denies any suicidal ideation.  She reduced mirtazapine to 45 mg daily 2 weeks ago.  Last visit increased lithium to 900 mg and had tremor after 4 days and reduced the dosage. Mood OK.  Better at Christmas.  Autumn Foster struggling with negative thoughts.  No SI.  Able to function.  Having to measure food to keep from gaining.  Exercise daily.  Autumn Foster about food.  Much better than back in the summer.  Always  Really tired and axiou s about food.  Job interview today and fears can't get up to work by 8 AM for work.  Hangover from mirtazapine after 8 hours.  Enjoys family but kids out of house usually.  Prior changed from Remeron to trazodone bc hangover, weight gain issues and constipation, bloating.  Felt fine for awhile.  Less food  cravings and less constipation, but the anxiety and rumination is worse. Depression was slipping back on her but not severe.  Also feels a little loopy.  Easier to get out of bed in am with trazodone.  Didn't like weight on mirtazapine.  Still exercises and strict with carbs diet.  Joints hurt on trazadone  When on mirtazapine concerned about the weight gain.  She feels like it is inching.  Crave food all the time and needs a lot of Miralax and water to manage constipation..  Did another ketamine infusion Nov 17 without much improvement.  [employer] won't pay for Spravato.   Hosp for SI wtith plan to cut wrists, wrote a sui note and H stopped her on Aug 25 and hosp then until 8/29 and started PHP for 3 weeks. Helped some. Then Autumn Foster did IV infusion #6 from9-17 to 9-26.  Resolved SI and depression much improved.   Mirtazapine helped sleep and anxiety. 8 hours.  Overexercises then feels bad with high heart rate.  Reviewed OCD scale with some catastrophisizing and intrusive thought and compulsive checking of kids.  Previous psych med trials include sertraline, fluoxetine, lamotrigine which caused a rash twice, clonazepam which caused her to be sleepy and have a headache, Pristiq which caused her to "jump out of my skin", Lexapro, Abilify with muscle twitches, buspirone uncontrollable crying, citalopram, Seroquel, prepped pramipexole which cause muscle twitching, Nuvigil, Viibryd which was ineffective, Latuda which was  ineffective, Deplin, bupropion which increased her anxiety, gabapentin which caused her to feel tired and loopy, hydroxyzine which cause dizziness, duloxetine, mirazapine 60mg  helped anxiety but cause weight gain, trazodone joint pain, lithium 900 tremor. Buspirone with celexa.  Poor response to SSRI.  Review of Systems:  Review of Systems  Constitutional: Positive for appetite change.  Gastrointestinal: Positive for constipation. Negative for abdominal distention and  nausea.  Neurological: Negative for dizziness and tremors.  Psychiatric/Behavioral: Negative for agitation, behavioral problems, confusion, decreased concentration, hallucinations, sleep disturbance and suicidal ideas. The patient is not nervous/anxious and is not hyperactive.     Medications: I have reviewed the patient's current medications.  Current Outpatient Medications  Medication Sig Dispense Refill  . cetirizine (ZYRTEC) 10 MG tablet Take 10 mg by mouth daily.    Marland Kitchen levothyroxine (SYNTHROID) 25 MCG tablet 50 mcg.     . liothyronine (CYTOMEL) 5 MCG tablet Take 5 mcg by mouth daily.     Marland Kitchen lithium carbonate 300 MG capsule Take 2 capsules (600 mg total) by mouth at bedtime. 180 capsule 0  . Magnesium Sulfate 70 MG CAPS Take by mouth.    . mirtazapine (REMERON) 30 MG tablet TAKE 2 TABLETS (60 MG TOTAL) BY MOUTH AT BEDTIME. (Patient taking differently: Take 45 mg by mouth at bedtime. ) 180 tablet 0  . phentermine 15 MG capsule Take 1 capsule (15 mg total) by mouth every morning. 30 capsule 1  . propranolol (INDERAL) 10 MG tablet Take 10 mg by mouth 2 (two) times daily as needed (1-2 bid as needed for anxiety).    . TRINTELLIX 20 MG TABS tablet TAKE 1 TABLET BY MOUTH EVERY DAY 30 tablet 2  . valACYclovir (VALTREX) 500 MG tablet valacyclovir 500 mg tablet  TAKE 1 TABLET BY MOUTH EVERY DAY    . vortioxetine HBr (TRINTELLIX) 10 MG TABS tablet Take 10 mg by mouth daily.    Marland Kitchen LORazepam (ATIVAN) 0.5 MG tablet Take 1 tablet (0.5 mg total) by mouth every 8 (eight) hours. (Patient not taking: Reported on 01/24/2019) 30 tablet 0   No current facility-administered medications for this visit.     Medication Side Effects: Sedation and Other: hungry.  She feels this unsustainable. Internal tremor from the Trintellix. Dry mouth, worse constipation.  Allergies:  Allergies  Allergen Reactions  . Lexapro [Escitalopram Oxalate] Other (See Comments)    Insomnia   . Pristiq [Desvenlafaxine]     Dry  heaves/dizzy  . Prozac [Fluoxetine Hcl]     HA and Strange Dreams  . Septra Ds [Sulfamethoxazole-Trimethoprim]     Jitteriness/hyper/affecting sleep  . Penicillins Rash    Childhood rash    Past Medical History:  Diagnosis Date  . Anemia   . Anxiety   . ASCUS on Pap smear   . Bacterial infection   . Bladder spasm   . Blood in stool   . Cancer ([place])    Basal Cell Skin Cancer  . Candida vaginitis   . Cervical dysplasia   . Colon polyp    tubular adenoma  . Depression   . Dizziness 04/05/2016  . Dysmenorrhea   . Endometrial polyp   . Fatigue   . Genital warts   . Hemorrhoids   . Herpes   . History of chicken pox   . HPV (human papilloma virus) infection   . Low blood pressure   . Lyme disease   . Menorrhagia   . Orthostatic hypotension 04/05/2016  . Osteopenia   . Ovarian  cyst   . PMDD (premenstrual dysphoric disorder)   . POTS (postural orthostatic tachycardia syndrome) 04/05/2016  . Rectal bleed   . Urinary frequency   . Urine incontinence   . Yeast infection     Family History  Problem Relation Age of Onset  . Prostate cancer Father   . Hypertension Father   . Renal cancer Father   . Migraines Mother   . Alcohol abuse Mother   . Suicidality Mother        deceased  . Colon cancer Paternal Grandfather   . Dementia Paternal Grandmother   . Thyroid disease Maternal Grandmother   . Rheum arthritis Maternal Grandmother   . Diabetes Maternal Grandfather   . Anxiety disorder Sister        x 2    Social History   Socioeconomic History  . Marital status: Married    Spouse name: Jaci Standard   . Number of children: 3  . Years of education: college  . Highest education level: Not on file  Occupational History  . Not on file  Social Needs  . Financial resource strain: Not hard at all  . Food insecurity:    Worry: Never true    Inability: Never true  . Transportation needs:    Medical: No    Non-medical: No  Tobacco Use  . Smoking status: Never Smoker  .  Smokeless tobacco: Never Used  Substance and Sexual Activity  . Alcohol use: Yes    Alcohol/week: 0.0 standard drinks    Comment: 1-2 drinks/week  . Drug use: No  . Sexual activity: Yes    Partners: Male    Birth control/protection: None    Comment: vasectony  Lifestyle  . Physical activity:    Days per week: 5 days    Minutes per session: 30 min  . Stress: Not at all  Relationships  . Social connections:    Talks on phone: More than three times a week    Gets together: More than three times a week    Attends religious service: More than 4 times per year    Active member of club or organization: No    Attends meetings of clubs or organizations: Never    Relationship status: Married  . Intimate partner violence:    Fear of current or ex partner: No    Emotionally abused: No    Physically abused: No    Forced sexual activity: No  Other Topics Concern  . Not on file  Social History Narrative   Drinks 1 cup of coffee a day    M hx multiple SA and died of Suicide at 21 yo.  Her aunt also. Past Medical History, Surgical history, Social history, and Family history were reviewed and updated as appropriate.   Please see review of systems for further details on the patient's review from today.   Objective:   Physical Exam:  There were no vitals taken for this visit.  Physical Exam Neurological:     Mental Status: She is alert and oriented to person, place, and time.     Cranial Nerves: No dysarthria.  Psychiatric:        Attention and Perception: Attention normal.        Mood and Affect: Mood is anxious. Mood is not depressed.        Speech: Speech normal.        Behavior: Behavior is cooperative.        Thought Content: Thought content normal.  Thought content is not paranoid or delusional. Thought content does not include homicidal or suicidal ideation. Thought content does not include homicidal or suicidal plan.        Cognition and Memory: Cognition and memory normal.         Judgment: Judgment normal.     Comments: Insight is fairly good.  It is noteworthy that her depression is in remission at this time.     Lab Review:     Component Value Date/Time   NA 137 10/31/2018 1422   K 4.4 10/31/2018 1422   CL 98 10/31/2018 1422   CO2 25 10/31/2018 1422   GLUCOSE 88 10/31/2018 1422   GLUCOSE 134 (H) 06/18/2018 1821   BUN 16 10/31/2018 1422   CREATININE 0.67 10/31/2018 1422   CALCIUM 9.9 10/31/2018 1422   PROT 7.1 06/18/2018 1821   PROT 7.6 03/31/2016 0932   ALBUMIN 4.6 06/18/2018 1821   ALBUMIN 5.1 03/31/2016 0932   AST 20 06/18/2018 1821   ALT 16 06/18/2018 1821   ALKPHOS 40 06/18/2018 1821   BILITOT 0.6 06/18/2018 1821   BILITOT 0.4 03/31/2016 0932   GFRNONAA 101 10/31/2018 1422   GFRAA 116 10/31/2018 1422       Component Value Date/Time   WBC 7.1 06/18/2018 1821   RBC 4.33 06/18/2018 1821   HGB 13.6 06/18/2018 1821   HCT 40.6 06/18/2018 1821   PLT 309 06/18/2018 1821   MCV 93.8 06/18/2018 1821   MCH 31.4 06/18/2018 1821   MCHC 33.5 06/18/2018 1821   RDW 12.6 06/18/2018 1821   LYMPHSABS 1.2 11/07/2012 0915   MONOABS 0.4 11/07/2012 0915   EOSABS 0.0 11/07/2012 0915   BASOSABS 0.0 11/07/2012 0915    Lithium Lvl  Date Value Ref Range Status  10/31/2018 0.4 (L) 0.6 - 1.2 mmol/L Final    Comment:                                     Detection Limit = 0.1                           <0.1 indicates None Detected    on 600mg /D  No results found for: PHENYTOIN, PHENOBARB, VALPROATE, CBMZ   .res Assessment: Plan:    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  We reviewed the long list of failed psychiatric medications in detail.  Many of which were more intolerances and did not receive adequate trials.  Explained to her in detail that at this point it is very  unlikely that we will achieve good control of her symptoms without some degree of side effect.  She is taken all the things with the least side effect risk.  Her symptoms have been very severe and need to be controlled.  Overall she is better than before the current meds.  However , she is clearly worsened off mirtazapine.  She doubts benefit from lithium.  Mirtazapine is helping.  As the [facility] of it mechanism of action of mirtazapine and its mechanism on causing increased appetite as it relates to dosing.  Somewhat obsessive about weight complicates options.  However, we have increased the Trintellix to 30 mg daily in hopes of being able to wean the  mirtazapine successfully.  Discussed the risk of relapse in doing so.  Wean mirtazapine by 15 mg every month.  Call if the depression worsens or anxiety becomes unmanageable.  She has relapsed with reduction in mirtazapine before but hopefully the higher dose of Trintellix in addition to the lithium will reduce that risk.  Option lithium 900 with propranolol to manage tremor.  Continue DBT  Can fight the weight gain if needed.  She agrees to this plan.  Her H will not want her togain weight either.  We started phentermine 15 mg daily and it is helping so we will continue the phentermine 15 mg daily  Counseled patient regarding potential benefits, risks, and side effects of lithium to include potential risk of lithium affecting thyroid and renal function.  Discussed need for periodic lab monitoring to determine drug level and to assess for potential adverse effects.  Counseled patient regarding signs and symptoms of lithium toxicity and advised that they notify office immediately or seek urgent medical attention if experiencing these signs and symptoms.  Patient advised to contact office with any questions or concerns.  Other Options, olanzepine, TCA, MAOI in detail, change to trazodone DT weight, increase lithium, Spravato, Vraylar.  Discussed each of  these options in detail including the side effect risks.  We will proceed with the wants with the least side effect risks and move up as needed.  Anxiety may worsen off mirtazepine.   Encouraged her to seek a insurance plan which may cover's Spravato in the future if needed.  Continue Trintellix to 30 mg daily above the usual max dosage for TRD.  No other med changes other than reducing mirtazapine as noted above.  This appt was 30 mins.  We will see her in 6 weeks.  She agreed with the plan.  I connected with patient by a video enabled telemedicine application or telephone, with their informed consent, and verified patient privacy and that I am speaking with the correct person using two identifiers.  I was located at office and patient at home.    Lynder Parents, MD, DFAPA  No future appointments.  No orders of the defined types were placed in this encounter.     -------------------------------  HPI

## 2019-01-29 ENCOUNTER — Other Ambulatory Visit: Payer: Self-pay | Admitting: Psychiatry

## 2019-02-12 ENCOUNTER — Telehealth: Payer: Self-pay | Admitting: Psychiatry

## 2019-02-12 NOTE — Telephone Encounter (Signed)
Return telephone call  Patient sent in a fax dated February 07, 2019.  It read as follows we will last talked I was actually down to 30 mg of mirtazapine not 45 mg as I had thought so I moved down to 15 mg.  Took this for about 4 days but may be very groggy.  I will have been off of it for 3 days now and cannot sleep very well.  I wake up 2 or 3 times in night starting at 2 AM and just toss and turn the rest of the night with unsettling dreams and wake up tired and anxious.  I feel much more anxious already.  Should he go back on the 15 mg and keep taking the phentermine for a few weeks?  Or if I stay off of it, wondering if this is just a phase to get through, or if we need to look at something else to help with anxiety and her sleep?  In the phone call today she states she is actually sleeping a little better and a little less anxious than when she wrote the note.  She is very interested in being off mirtazapine because of the weight gain effects.  She is less hungry off the mirtazapine and is pleased with that.  Discussed the option of buspirone.  Discussed the option of ultra low doses of mirtazapine just to aid with sleep knowing that it would not be sufficient to help anxiety or depression.  However she would like to defer that.  Follow-up appointment May 10  No med changes today  Lynder Parents MD, DFAPA

## 2019-03-07 ENCOUNTER — Encounter: Payer: Self-pay | Admitting: Psychiatry

## 2019-03-07 ENCOUNTER — Ambulatory Visit: Payer: BLUE CROSS/BLUE SHIELD | Admitting: Psychiatry

## 2019-03-07 DIAGNOSIS — F900 Attention-deficit hyperactivity disorder, predominantly inattentive type: Secondary | ICD-10-CM

## 2019-03-07 DIAGNOSIS — G251 Drug-induced tremor: Secondary | ICD-10-CM | POA: Diagnosis not present

## 2019-03-07 DIAGNOSIS — F339 Major depressive disorder, recurrent, unspecified: Secondary | ICD-10-CM | POA: Diagnosis not present

## 2019-03-07 DIAGNOSIS — F411 Generalized anxiety disorder: Secondary | ICD-10-CM | POA: Diagnosis not present

## 2019-03-07 MED ORDER — LISDEXAMFETAMINE DIMESYLATE 20 MG PO CAPS: 20.0000 mg | ORAL_CAPSULE | Freq: Every day | ORAL | 0 refills | Status: DC

## 2019-03-07 NOTE — Progress Notes (Signed)
Autumn Foster [id number] [birth year] 53 y.o.   Virtual Visit via Telephone Note  I connected with pt by telephone and verified that I am speaking with the correct person using two identifiers.   I discussed the limitations, risks, security and privacy concerns of performing an evaluation and management service by telephone and the availability of in person appointments. I also discussed with the patient that there may be a patient responsible charge related to this service. The patient expressed understanding and agreed to proceed.  I discussed the assessment and treatment plan with the patient. The patient was provided an opportunity to ask questions and all were answered. The patient agreed with the plan and demonstrated an understanding of the instructions.   The patient was advised to call back or seek an in-person evaluation if the symptoms worsen or if the condition fails to improve as anticipated.  I provided 30 minutes of non-face-to-face time during this encounter. The call started at 2:00 and ended at 230. The patient was located at home and the provider was located office.   Subjective:   Patient ID:  Autumn Foster is a 53 y.o. (DOB [birth year]) female.  Chief Complaint:  Chief Complaint  Patient presents with  . Depression  . Anxiety  . Follow-up    med management  . Medication Problem    SE    Autumn Foster presents to the office today for follow-up of TR severe, major depression and anxiety.  Last visit was January 24, 2019.  The patient wanted to wean off mirtazapine because of weight gain though she has had relapse when she is done this before.  Awaken a couple of times nightly since off mirtazapine.  Almost 8 hours and seems less deep with brief awakenings.  Alert during the day.  Energy a little low.  A little more anxious overall.  Likes her job which helps.  A little less happy but overall has done well since here.  Phentermine gave her a little boost.  Asks about  stimulant augmentations for depression to take the place of the mirtazapine.Autumn Foster with eating now.  Centration, motivation, distractibility, mood are a little bit worse off the mirtazapine but not severe.  Walks 3-4 miles daily but no longer going to gym.  Feels better off the mirtazapine.  on December 13, 2018.  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   [employer] won't pay for Spravato.   Hosp for SI wtith plan to cut wrists, wrote a sui note and H stopped her on Aug 25 and hosp then until 8/29 and started PHP for 3 weeks. Helped some. Then [facility] did IV infusion #6 from9-17 to 9-26.  Resolved SI and depression much improved.    Reviewed OCD scale with some catastrophisizing and intrusive thought and compulsive checking of kids.  Previous psych med trials include sertraline, fluoxetine, lamotrigine which caused a rash twice, clonazepam which caused her to be sleepy and have a headache, Pristiq which caused her to "jump out of my skin", Lexapro, Abilify with muscle twitches, buspirone uncontrollable crying, citalopram, Seroquel, prepped pramipexole which cause muscle twitching, Nuvigil, Viibryd which was ineffective, Latuda which was ineffective, Deplin, bupropion which increased her anxiety, gabapentin which caused her to feel tired and loopy, hydroxyzine which cause dizziness, duloxetine, mirazapine 60mg  helped anxiety but cause weight gain, trazodone joint pain, lithium 900 tremor. Buspirone with celexa.  Poor response to SSRI. Did another ketamine infusion Nov  17 without much improvement.  Review of Systems:  Review of Systems  Constitutional: Negative for appetite change.  Gastrointestinal: Negative for abdominal distention, constipation and nausea.  Neurological: Negative for dizziness and tremors.  Psychiatric/Behavioral: Positive for depression. Negative for agitation, behavioral problems, confusion, decreased concentration,  hallucinations, sleep disturbance and suicidal ideas. The patient is not nervous/anxious and is not hyperactive.     Medications: I have reviewed the patient's current medications.  Current Outpatient Medications  Medication Sig Dispense Refill  . cetirizine (ZYRTEC) 10 MG tablet Take 10 mg by mouth daily.    Marland Kitchen levothyroxine (SYNTHROID) 25 MCG tablet 50 mcg.     . liothyronine (CYTOMEL) 5 MCG tablet Take 5 mcg by mouth daily.     Marland Kitchen lithium carbonate 300 MG capsule Take 2 capsules (600 mg total) by mouth at bedtime. 180 capsule 0  . Magnesium Sulfate 70 MG CAPS Take by mouth.    . TRINTELLIX 20 MG TABS tablet TAKE 1 TABLET BY MOUTH EVERY DAY 30 tablet 2  . valACYclovir (VALTREX) 500 MG tablet valacyclovir 500 mg tablet  TAKE 1 TABLET BY MOUTH EVERY DAY    . vortioxetine HBr (TRINTELLIX) 10 MG TABS tablet Take 1 tablet (10 mg total) by mouth daily. 30 tablet 1  . LORazepam (ATIVAN) 0.5 MG tablet Take 1 tablet (0.5 mg total) by mouth every 8 (eight) hours. (Patient not taking: Reported on 01/24/2019) 30 tablet 0  . mirtazapine (REMERON) 30 MG tablet TAKE 2 TABLETS (60 MG TOTAL) BY MOUTH AT BEDTIME. (Patient not taking: Reported on 03/07/2019) 180 tablet 0  . phentermine 15 MG capsule Take 1 capsule (15 mg total) by mouth every morning. (Patient not taking: Reported on 03/07/2019) 30 capsule 1  . propranolol (INDERAL) 10 MG tablet Take 10 mg by mouth 2 (two) times daily as needed (1-2 bid as needed for anxiety).     No current facility-administered medications for this visit.     Medication Side Effects: Sedation and Other: hungry.  She feels this unsustainable. Internal tremor from the Trintellix. Dry mouth, worse constipation.  Allergies:  Allergies  Allergen Reactions  . Lexapro [Escitalopram Oxalate] Other (See Comments)    Insomnia   . Pristiq [Desvenlafaxine]     Dry heaves/dizzy  . Prozac [Fluoxetine Hcl]     HA and Strange Dreams  . Septra Ds [Sulfamethoxazole-Trimethoprim]      Jitteriness/hyper/affecting sleep  . Penicillins Rash    Childhood rash    Past Medical History:  Diagnosis Date  . Anemia   . Anxiety   . ASCUS on Pap smear   . Bacterial infection   . Bladder spasm   . Blood in stool   . Cancer ([place])    Basal Cell Skin Cancer  . Candida vaginitis   . Cervical dysplasia   . Colon polyp    tubular adenoma  . Depression   . Dizziness 04/05/2016  . Dysmenorrhea   . Endometrial polyp   . Fatigue   . Genital warts   . Hemorrhoids   . Herpes   . History of chicken pox   . HPV (human papilloma virus) infection   . Low blood pressure   . Lyme disease   . Menorrhagia   . Orthostatic hypotension 04/05/2016  . Osteopenia   . Ovarian cyst   . PMDD (premenstrual dysphoric disorder)   . POTS (postural orthostatic tachycardia syndrome) 04/05/2016  . Rectal bleed   . Urinary frequency   . Urine incontinence   .  Yeast infection     Family History  Problem Relation Age of Onset  . Prostate cancer Father   . Hypertension Father   . Renal cancer Father   . Migraines Mother   . Alcohol abuse Mother   . Suicidality Mother        deceased  . Colon cancer Paternal Grandfather   . Dementia Paternal Grandmother   . Thyroid disease Maternal Grandmother   . Rheum arthritis Maternal Grandmother   . Diabetes Maternal Grandfather   . Anxiety disorder Sister        x 2    Social History   Socioeconomic History  . Marital status: Married    Spouse name: Jaci Standard   . Number of children: 3  . Years of education: college  . Highest education level: Not on file  Occupational History  . Not on file  Social Needs  . Financial resource strain: Not hard at all  . Food insecurity:    Worry: Never true    Inability: Never true  . Transportation needs:    Medical: No    Non-medical: No  Tobacco Use  . Smoking status: Never Smoker  . Smokeless tobacco: Never Used  Substance and Sexual Activity  . Alcohol use: Yes    Alcohol/week: 0.0 standard  drinks    Comment: 1-2 drinks/week  . Drug use: No  . Sexual activity: Yes    Partners: Male    Birth control/protection: None    Comment: vasectony  Lifestyle  . Physical activity:    Days per week: 5 days    Minutes per session: 30 min  . Stress: Not at all  Relationships  . Social connections:    Talks on phone: More than three times a week    Gets together: More than three times a week    Attends religious service: More than 4 times per year    Active member of club or organization: No    Attends meetings of clubs or organizations: Never    Relationship status: Married  . Intimate partner violence:    Fear of current or ex partner: No    Emotionally abused: No    Physically abused: No    Forced sexual activity: No  Other Topics Concern  . Not on file  Social History Narrative   Drinks 1 cup of coffee a day    M hx multiple SA and died of Suicide at 47 yo.  Her aunt also. 2 of 3 kids on Vyvanse and done well. Past Medical History, Surgical history, Social history, and Family history were reviewed and updated as appropriate.   Please see review of systems for further details on the patient's review from today.   Objective:   Physical Exam:  There were no vitals taken for this visit.  Physical Exam Neurological:     Mental Status: She is alert and oriented to person, place, and time.     Cranial Nerves: No dysarthria.  Psychiatric:        Attention and Perception: Attention normal.        Mood and Affect: Mood is anxious and depressed.        Speech: Speech normal.        Behavior: Behavior is cooperative.        Thought Content: Thought content normal. Thought content is not paranoid or delusional. Thought content does not include homicidal or suicidal ideation. Thought content does not include homicidal or suicidal plan.  Cognition and Memory: Cognition and memory normal.        Judgment: Judgment normal.     Comments: She has had a slight worsening of  mood since off the mirtazapine but not full-blown relapse as has happened in the past.Insight is fairly good.      Lab Review:     Component Value Date/Time   NA 137 10/31/2018 1422   K 4.4 10/31/2018 1422   CL 98 10/31/2018 1422   CO2 25 10/31/2018 1422   GLUCOSE 88 10/31/2018 1422   GLUCOSE 134 (H) 06/18/2018 1821   BUN 16 10/31/2018 1422   CREATININE 0.67 10/31/2018 1422   CALCIUM 9.9 10/31/2018 1422   PROT 7.1 06/18/2018 1821   PROT 7.6 03/31/2016 0932   ALBUMIN 4.6 06/18/2018 1821   ALBUMIN 5.1 03/31/2016 0932   AST 20 06/18/2018 1821   ALT 16 06/18/2018 1821   ALKPHOS 40 06/18/2018 1821   BILITOT 0.6 06/18/2018 1821   BILITOT 0.4 03/31/2016 0932   GFRNONAA 101 10/31/2018 1422   GFRAA 116 10/31/2018 1422       Component Value Date/Time   WBC 7.1 06/18/2018 1821   RBC 4.33 06/18/2018 1821   HGB 13.6 06/18/2018 1821   HCT 40.6 06/18/2018 1821   PLT 309 06/18/2018 1821   MCV 93.8 06/18/2018 1821   MCH 31.4 06/18/2018 1821   MCHC 33.5 06/18/2018 1821   RDW 12.6 06/18/2018 1821   LYMPHSABS 1.2 11/07/2012 0915   MONOABS 0.4 11/07/2012 0915   EOSABS 0.0 11/07/2012 0915   BASOSABS 0.0 11/07/2012 0915    Lithium Lvl  Date Value Ref Range Status  10/31/2018 0.4 (L) 0.6 - 1.2 mmol/L Final    Comment:                                     Detection Limit = 0.1                           <0.1 indicates None Detected    on 600mg /D  No results found for: PHENYTOIN, PHENOBARB, VALPROATE, CBMZ   .res Assessment: Plan:    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  We reviewed the long list of failed psychiatric medications in detail.  Many of which were more intolerances and did not receive adequate trials.  Explained to her in  detail that at this point it is very unlikely that we will achieve good control of her symptoms without some degree of side effect.  She is taken all the things with the least side effect risk.  Her symptoms have been very severe and need to be controlled.  Overall she is better than before the current meds.  She doubts benefit from lithium.  Mirtazapine clearly was helpful for her depression but caused significant weight gain and an good deal of distress from that.  She was desperate to get off that medication.  She is now been off of it about 4 weeks and feels better about herself.  Her appetite is more normal.  She feels a little more down but it is not severe.  I agree with her request about stimulant augmentation given her history of treatment resistant  depression that is very reasonable approach.  Vyvanse augmentation for depression and attention and focus and productivity 20 mg daily.  Wants alittle more benefit for mood, energy, productivity.  Feels droopy a lot of the time. Lost a little regarding depression off the mirtazapine.  He has a strong family history of ADD and to her for her 3 children. Discussed potential benefits, risks, and side effects of stimulants with patient to include increased heart rate, palpitations, insomnia, increased anxiety, increased irritability, or decreased appetite.  Instructed patient to contact office if experiencing any significant tolerability issues.   Continue DBT  Counseled patient regarding potential benefits, risks, and side effects of lithium to include potential risk of lithium affecting thyroid and renal function.  Discussed need for periodic lab monitoring to determine drug level and to assess for potential adverse effects.  Counseled patient regarding signs and symptoms of lithium toxicity and advised that they notify office immediately or seek urgent medical attention if experiencing these signs and symptoms.  Patient advised to contact office with  any questions or concerns.  Other Options, olanzepine, TCA, MAOI in detail, change to trazodone DT weight, increase lithium, Spravato, Vraylar.  Discussed each of these options in detail including the side effect risks.  We will proceed with the wants with the least side effect risks and move up as needed.  Anxiety may worsen off mirtazepine.   Encouraged her to seek a insurance plan which may cover's Spravato in the future if needed.  Continue Trintellix to 30 mg (20mg  + 10mg ) daily above the usual max dosage for TRD.  This appt was 30 mins.  We will see her in 6 weeks.  She agreed with the plan.  Lynder Parents, MD, DFAPA  Future Appointments  Date Time Provider [place]  04/16/2019  1:30 PM Cottle, Billey Co., MD [facility] None    No orders of the defined types were placed in this encounter.

## 2019-03-30 ENCOUNTER — Other Ambulatory Visit: Payer: Self-pay | Admitting: Psychiatry

## 2019-03-30 DIAGNOSIS — F332 Major depressive disorder, recurrent severe without psychotic features: Secondary | ICD-10-CM

## 2019-04-09 ENCOUNTER — Telehealth: Payer: Self-pay | Admitting: Psychiatry

## 2019-04-09 ENCOUNTER — Other Ambulatory Visit: Payer: Self-pay

## 2019-04-09 DIAGNOSIS — F900 Attention-deficit hyperactivity disorder, predominantly inattentive type: Secondary | ICD-10-CM

## 2019-04-09 DIAGNOSIS — F339 Major depressive disorder, recurrent, unspecified: Secondary | ICD-10-CM

## 2019-04-09 MED ORDER — LISDEXAMFETAMINE DIMESYLATE 20 MG PO CAPS: 20.0000 mg | ORAL_CAPSULE | Freq: Every day | ORAL | 0 refills | Status: DC

## 2019-04-09 NOTE — Telephone Encounter (Signed)
Pended for approval.

## 2019-04-09 NOTE — Telephone Encounter (Signed)
Patient called and said that the vyvanse 20 mg is workig great and she needs a refill on it sent to [facility] [place] on [address]. Next appt is 6/22

## 2019-04-14 ENCOUNTER — Other Ambulatory Visit: Payer: Self-pay | Admitting: Psychiatry

## 2019-04-14 DIAGNOSIS — F332 Major depressive disorder, recurrent severe without psychotic features: Secondary | ICD-10-CM

## 2019-04-16 ENCOUNTER — Other Ambulatory Visit: Payer: Self-pay

## 2019-04-16 ENCOUNTER — Ambulatory Visit: Payer: BC Managed Care – PPO | Admitting: Psychiatry

## 2019-04-16 ENCOUNTER — Encounter: Payer: Self-pay | Admitting: Psychiatry

## 2019-04-16 DIAGNOSIS — F900 Attention-deficit hyperactivity disorder, predominantly inattentive type: Secondary | ICD-10-CM

## 2019-04-16 DIAGNOSIS — F411 Generalized anxiety disorder: Secondary | ICD-10-CM

## 2019-04-16 DIAGNOSIS — F339 Major depressive disorder, recurrent, unspecified: Secondary | ICD-10-CM

## 2019-04-16 MED ORDER — LISDEXAMFETAMINE DIMESYLATE 20 MG PO CAPS: 20.0000 mg | ORAL_CAPSULE | Freq: Every day | ORAL | 0 refills | Status: DC

## 2019-04-16 NOTE — Progress Notes (Signed)
Autumn Foster [id number] [birth year] 53 y.o.     Subjective:   Patient ID:  Autumn Foster is a 53 y.o. (DOB 10/18/1966) female.  Chief Complaint:  Chief Complaint  Patient presents with  . Depression    med management  . Sleeping Problem    Autumn Foster presents to the office today for follow-up of TR severe, major depression and anxiety.  Last visit May Vyvanse 20 added Vyvanse augmentation for depression and attention and focus and productivity 20 mg daily. Got alittle more benefit for mood, energy, productivity. More normal.   Lost most of the mirtazapine weight.  AT visit was April 1, 53  The patient wanted to wean off mirtazapine because of weight gain though she has had relapse when she is done this before.  Sleep pretty good with brief awakenings. Almost 8 hours and seems less deep with brief awakenings.  Alert during the day.  Energy a little low.  A little more anxious overall.  Likes her job which helps.  A little less happy but overall has done well since here.  Autumn Foster with eating now.  Concentration, motivation, distractibility, mood are a little bit worse off the mirtazapine but not severe.  Walks 3-4 miles daily but no longer going to gym.  Feels better off the mirtazapine.  on December 13, 2018.  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   [employer] won't pay for Spravato.  Adult kids living at home with Covid.   Reviewed OCD scale with some catastrophisizing and intrusive thought and compulsive checking of kids.  Previous psych med trials include sertraline, fluoxetine, lamotrigine which caused a rash twice, clonazepam which caused her to be sleepy and have a headache, Pristiq which caused her to "jump out of my skin", Lexapro, Abilify with muscle twitches, buspirone uncontrollable crying, citalopram, Seroquel, prepped pramipexole which cause muscle twitching, Nuvigil, Viibryd which was ineffective, Latuda which was  ineffective, Deplin, bupropion which increased her anxiety, gabapentin which caused her to feel tired and loopy, hydroxyzine which cause dizziness, duloxetine, mirazapine 60mg  helped anxiety but cause weight gain, trazodone joint pain, lithium 900 tremor. Buspirone with celexa.  Poor response to SSRI. Did another ketamine infusion Nov 17 without much improvement. Hosp for SI wtith plan to cut wrists, wrote a sui note and H stopped her on Aug 25 and hosp then until 8/29 and started PHP for 3 weeks. Helped some. Then [place] did IV infusion #6 from9-17 to 9-26.  Resolved SI and depression much improved.    Review of Systems:  Review of Systems  Constitutional: Negative for appetite change.  Gastrointestinal: Negative for abdominal distention, constipation and nausea.  Neurological: Negative for dizziness and tremors.  Psychiatric/Behavioral: Negative for agitation, behavioral problems, confusion, decreased concentration, hallucinations, sleep disturbance and suicidal ideas. The patient is not nervous/anxious and is not hyperactive.     Medications: I have reviewed the patient's current medications.  Current Outpatient Medications  Medication Sig Dispense Refill  . cetirizine (ZYRTEC) 10 MG tablet Take 10 mg by mouth daily.    Marland Kitchen levothyroxine (SYNTHROID) 25 MCG tablet 50 mcg.     . liothyronine (CYTOMEL) 5 MCG tablet Take 5 mcg by mouth daily.     Marland Kitchen lisdexamfetamine (VYVANSE) 20 MG capsule Take 1 capsule (20 mg total) by mouth daily. 30 capsule 0  . lithium carbonate 300 MG capsule TAKE 2 CAPSULES (600 MG TOTAL) BY MOUTH AT BEDTIME. 180 capsule 0  .  Magnesium Sulfate 70 MG CAPS Take by mouth.    . TRINTELLIX 20 MG TABS tablet TAKE 1 TABLET BY MOUTH EVERY DAY 30 tablet 2  . valACYclovir (VALTREX) 500 MG tablet valacyclovir 500 mg tablet  TAKE 1 TABLET BY MOUTH EVERY DAY    . vortioxetine HBr (TRINTELLIX) 10 MG TABS tablet Take 1 tablet (10 mg total) by mouth daily. 30 tablet  1  . LORazepam (ATIVAN) 0.5 MG tablet Take 1 tablet (0.5 mg total) by mouth every 8 (eight) hours. (Patient not taking: Reported on 01/24/2019) 30 tablet 0  . mirtazapine (REMERON) 30 MG tablet TAKE 2 TABLETS (60 MG TOTAL) BY MOUTH AT BEDTIME. (Patient not taking: Reported on 03/07/2019) 180 tablet 0  . phentermine 15 MG capsule Take 1 capsule (15 mg total) by mouth every morning. (Patient not taking: Reported on 03/07/2019) 30 capsule 1  . propranolol (INDERAL) 10 MG tablet Take 10 mg by mouth 2 (two) times daily as needed (1-2 bid as needed for anxiety).     No current facility-administered medications for this visit.     Medication Side Effects:   Dry mouth, worse constipation.  Allergies:  Allergies  Allergen Reactions  . Lexapro [Escitalopram Oxalate] Other (See Comments)    Insomnia   . Pristiq [Desvenlafaxine]     Dry heaves/dizzy  . Prozac [Fluoxetine Hcl]     HA and Strange Dreams  . Septra Ds [Sulfamethoxazole-Trimethoprim]     Jitteriness/hyper/affecting sleep  . Penicillins Rash    Childhood rash    Past Medical History:  Diagnosis Date  . Anemia   . Anxiety   . ASCUS on Pap smear   . Bacterial infection   . Bladder spasm   . Blood in stool   . Cancer ([place])    Basal Cell Skin Cancer  . Candida vaginitis   . Cervical dysplasia   . Colon polyp    tubular adenoma  . Depression   . Dizziness 04/05/2016  . Dysmenorrhea   . Endometrial polyp   . Fatigue   . Genital warts   . Hemorrhoids   . Herpes   . History of chicken pox   . HPV (human papilloma virus) infection   . Low blood pressure   . Lyme disease   . Menorrhagia   . Orthostatic hypotension 04/05/2016  . Osteopenia   . Ovarian cyst   . PMDD (premenstrual dysphoric disorder)   . POTS (postural orthostatic tachycardia syndrome) 04/05/2016  . Rectal bleed   . Urinary frequency   . Urine incontinence   . Yeast infection     Family History  Problem Relation Age of Onset  . Prostate cancer Father    . Hypertension Father   . Renal cancer Father   . Migraines Mother   . Alcohol abuse Mother   . Suicidality Mother        deceased  . Colon cancer Paternal Grandfather   . Dementia Paternal Grandmother   . Thyroid disease Maternal Grandmother   . Rheum arthritis Maternal Grandmother   . Diabetes Maternal Grandfather   . Anxiety disorder Sister        x 2    Social History   Socioeconomic History  . Marital status: Married    Spouse name: Jaci Standard   . Number of children: 3  . Years of education: college  . Highest education level: Not on file  Occupational History  . Not on file  Social Needs  . Financial resource strain: Not  hard at all  . Food insecurity    Worry: Never true    Inability: Never true  . Transportation needs    Medical: No    Non-medical: No  Tobacco Use  . Smoking status: Never Smoker  . Smokeless tobacco: Never Used  Substance and Sexual Activity  . Alcohol use: Yes    Alcohol/week: 0.0 standard drinks    Comment: 1-2 drinks/week  . Drug use: No  . Sexual activity: Yes    Partners: Male    Birth control/protection: None    Comment: vasectony  Lifestyle  . Physical activity    Days per week: 5 days    Minutes per session: 30 min  . Stress: Not at all  Relationships  . Social connections    Talks on phone: More than three times a week    Gets together: More than three times a week    Attends religious service: More than 4 times per year    Active member of club or organization: No    Attends meetings of clubs or organizations: Never    Relationship status: Married  . Intimate partner violence    Fear of current or ex partner: No    Emotionally abused: No    Physically abused: No    Forced sexual activity: No  Other Topics Concern  . Not on file  Social History Narrative   Drinks 1 cup of coffee a day    M hx multiple SA and died of Suicide at 60 yo.  Her aunt also. 2 of 3 kids on Vyvanse and done well. Past Medical History,  Surgical history, Social history, and Family history were reviewed and updated as appropriate.   Please see review of systems for further details on the patient's review from today.   Objective:   Physical Exam:  There were no vitals taken for this visit.  Physical Exam Neurological:     Mental Status: She is alert and oriented to person, place, and time.     Cranial Nerves: No dysarthria.  Psychiatric:        Attention and Perception: Attention normal.        Mood and Affect: Mood is anxious and depressed.        Speech: Speech normal.        Behavior: Behavior is cooperative.        Thought Content: Thought content normal. Thought content is not paranoid or delusional. Thought content does not include homicidal or suicidal ideation. Thought content does not include homicidal or suicidal plan.        Cognition and Memory: Cognition and memory normal.        Judgment: Judgment normal.     Comments: She has had a slight worsening of mood since off the mirtazapine but not full-blown relapse as has happened in the past.Insight is fairly good.      Lab Review:     Component Value Date/Time   NA 137 10/31/2018 1422   K 4.4 10/31/2018 1422   CL 98 10/31/2018 1422   CO2 25 10/31/2018 1422   GLUCOSE 88 10/31/2018 1422   GLUCOSE 134 (H) 06/18/2018 1821   BUN 16 10/31/2018 1422   CREATININE 0.67 10/31/2018 1422   CALCIUM 9.9 10/31/2018 1422   PROT 7.1 06/18/2018 1821   PROT 7.6 03/31/2016 0932   ALBUMIN 4.6 06/18/2018 1821   ALBUMIN 5.1 03/31/2016 0932   AST 20 06/18/2018 1821   ALT 16 06/18/2018 1821  ALKPHOS 40 06/18/2018 1821   BILITOT 0.6 06/18/2018 1821   BILITOT 0.4 03/31/2016 0932   GFRNONAA 101 10/31/2018 1422   GFRAA 116 10/31/2018 1422       Component Value Date/Time   WBC 7.1 06/18/2018 1821   RBC 4.33 06/18/2018 1821   HGB 13.6 06/18/2018 1821   HCT 40.6 06/18/2018 1821   PLT 309 06/18/2018 1821   MCV 93.8 06/18/2018 1821   MCH 31.4 06/18/2018 1821   MCHC  33.5 06/18/2018 1821   RDW 12.6 06/18/2018 1821   LYMPHSABS 1.2 11/07/2012 0915   MONOABS 0.4 11/07/2012 0915   EOSABS 0.0 11/07/2012 0915   BASOSABS 0.0 11/07/2012 0915    Lithium Lvl  Date Value Ref Range Status  10/31/2018 0.4 (L) 0.6 - 1.2 mmol/L Final    Comment:                                     Detection Limit = 0.1                           <0.1 indicates None Detected    on 600mg /D  No results found for: PHENYTOIN, PHENOBARB, VALPROATE, CBMZ   .res Assessment: Plan:    Matisyn was seen today for depression and sleeping problem.  Diagnoses and all orders for this visit:  Recurrent major depression resistant to treatment ([place])  Generalized anxiety disorder  Attention deficit hyperactivity disorder (ADHD), predominantly inattentive type  RO some OCD   We discussed her severe treatment resistant major depression and anxiety.  We reviewed the long list of failed psychiatric medications in detail.  Many of which were more intolerances and did not receive adequate trials.  Explained to her in detail that at this point it is very unlikely that we will achieve good control of her symptoms without some degree of side effect.  She is taken all the things with the least side effect risk.  Her symptoms have been very severe and need to be controlled.  Overall she is better than before the current meds.  She doubts benefit from lithium.  Mirtazapine clearly was helpful for her depression but caused significant weight gain and an good deal of distress from that.  She was desperate to get off that medication.  She is now been off of it about 3 months and feels better about herself.  Her appetite is more normal. I agreed with her request about stimulant augmentation given her history of treatment resistant depression that is very reasonable approach.  And it was successful.  Continue Vyvanse augmentation for depression and attention and focus and productivity 20 mg daily.  Received  alittle more benefit for mood, energy, productivity.    He has a strong family history of ADD and to her for her 3 children. Discussed potential benefits, risks, and side effects of stimulants with patient to include increased heart rate, palpitations, insomnia, increased anxiety, increased irritability, or decreased appetite.  Instructed patient to contact office if experiencing any significant tolerability issues.  Continue DBT  Counseled patient regarding potential benefits, risks, and side effects of lithium to include potential risk of lithium affecting thyroid and renal function.  Discussed need for periodic lab monitoring to determine drug level and to assess for potential adverse effects.  Counseled patient regarding signs and symptoms of lithium toxicity and advised that they notify office immediately or  seek urgent medical attention if experiencing these signs and symptoms.  Patient advised to contact office with any questions or concerns.  Other Options, olanzepine, TCA, MAOI in detail, change to trazodone DT weight, increase lithium, Spravato, Vraylar.  Discussed each of these options in detail including the side effect risks.  We will proceed with the wants with the least side effect risks and move up as needed.  Anxiety has been ok off mirtazepine.    Continue Trintellix to 30 mg (20mg  + 10mg ) daily above the usual max dosage for TRD.  This appt was 30 mins.  3 mos.  She agreed with the plan.  Lynder Parents, MD, DFAPA  No future appointments.  No orders of the defined types were placed in this encounter.

## 2019-04-20 ENCOUNTER — Other Ambulatory Visit: Payer: Self-pay | Admitting: Psychiatry

## 2019-05-11 ENCOUNTER — Other Ambulatory Visit: Payer: Self-pay | Admitting: Psychiatry

## 2019-06-22 ENCOUNTER — Other Ambulatory Visit: Payer: Self-pay | Admitting: Psychiatry

## 2019-06-22 DIAGNOSIS — F332 Major depressive disorder, recurrent severe without psychotic features: Secondary | ICD-10-CM

## 2019-06-29 ENCOUNTER — Other Ambulatory Visit: Payer: Self-pay

## 2019-06-29 MED ORDER — VORTIOXETINE HBR 10 MG PO TABS: 10.0000 mg | ORAL_TABLET | Freq: Every day | ORAL | 1 refills | Status: DC

## 2019-07-11 ENCOUNTER — Other Ambulatory Visit (HOSPITAL_COMMUNITY): Payer: Self-pay | Admitting: Family

## 2019-07-12 ENCOUNTER — Telehealth: Payer: Self-pay | Admitting: Psychiatry

## 2019-07-12 ENCOUNTER — Other Ambulatory Visit: Payer: Self-pay

## 2019-07-12 DIAGNOSIS — F339 Major depressive disorder, recurrent, unspecified: Secondary | ICD-10-CM

## 2019-07-12 DIAGNOSIS — F900 Attention-deficit hyperactivity disorder, predominantly inattentive type: Secondary | ICD-10-CM

## 2019-07-12 MED ORDER — LISDEXAMFETAMINE DIMESYLATE 20 MG PO CAPS: 20.0000 mg | ORAL_CAPSULE | Freq: Every day | ORAL | 0 refills | Status: DC

## 2019-07-12 NOTE — Telephone Encounter (Signed)
Pt called to request refill for Vyvanse 20 mg 1/d @ [facility] [employer] on file.

## 2019-07-12 NOTE — Telephone Encounter (Signed)
Last refill 06/12/2019 Next appt 08/09/2019 Pended for approval

## 2019-07-17 ENCOUNTER — Ambulatory Visit: Payer: BC Managed Care – PPO | Admitting: Psychiatry

## 2019-08-09 ENCOUNTER — Ambulatory Visit (INDEPENDENT_AMBULATORY_CARE_PROVIDER_SITE_OTHER): Payer: BC Managed Care – PPO | Admitting: Psychiatry

## 2019-08-09 ENCOUNTER — Other Ambulatory Visit: Payer: Self-pay

## 2019-08-09 ENCOUNTER — Encounter: Payer: Self-pay | Admitting: Psychiatry

## 2019-08-09 DIAGNOSIS — Z79899 Other long term (current) drug therapy: Secondary | ICD-10-CM | POA: Diagnosis not present

## 2019-08-09 DIAGNOSIS — F411 Generalized anxiety disorder: Secondary | ICD-10-CM

## 2019-08-09 DIAGNOSIS — F339 Major depressive disorder, recurrent, unspecified: Secondary | ICD-10-CM | POA: Diagnosis not present

## 2019-08-09 DIAGNOSIS — F900 Attention-deficit hyperactivity disorder, predominantly inattentive type: Secondary | ICD-10-CM | POA: Diagnosis not present

## 2019-08-09 DIAGNOSIS — G251 Drug-induced tremor: Secondary | ICD-10-CM

## 2019-08-09 MED ORDER — LITHIUM CARBONATE 150 MG PO CAPS: 150.0000 mg | ORAL_CAPSULE | Freq: Three times a day (TID) | ORAL | 0 refills | Status: DC

## 2019-08-09 NOTE — Patient Instructions (Signed)
Reduce llthium to 300 + 150 daily for 1 week, Then get the blood test

## 2019-08-09 NOTE — Progress Notes (Signed)
[name] [id number] 07-07-1966 53 y.o.     Subjective:   Patient ID:  Autumn Foster is a 53 y.o. (DOB Jan 02, 1966) female.  Chief Complaint:  Chief Complaint  Patient presents with  . Follow-up    Medication Management  . Depression    Medication Management  . Medication Problem    weight gain    [name] presents to the office today for follow-up of TR severe, major depression and anxiety.  on December 13, 2018.  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  At visit May Vyvanse 20 added Vyvanse augmentation for depression and attention and focus and productivity 20 mg daily. Got alittle more benefit for mood, energy, productivity. More normal.   Lost most off the mirtazapine weight.  AT visit was January 24, 2019.  The patient wanted to wean off mirtazapine because of weight gain though she has had relapse when she is done this before.  At last visit in June no meds were changed.  She remained on Trintellix 30 mg daily and Vyvanse 20 mg daily  Mental health is really good considering.  Able to go to work.  H not travelling.  Son is employed and kids are around good.  Concern about weight.  Still always hungry and thinks about food all the time.  Wonders if it is lithium.  Eats healthy.  Exercises.    Stopped Vyvanse bc urinary urgency and ravenous when it wore off at the end of the day.  It is a little better off the Vyvanse 20.  Urinary problem not resolved off the Vyvanse.    Sleep pretty good with brief awakenings. Almost 8 hours and seems less deep with brief awakenings.  Alert during the day.  Energy a little low.  A little more anxious overall.  Likes her job which helps.  A little less happy but overall has done well since here.  [name] with eating now.  Concentration, motivation, distractibility,  Walks 3-4 miles daily but no longer going to gym.  Feels better off the mirtazapine.  [employer] won't pay for Spravato.  Reviewed  OCD scale with some catastrophisizing and intrusive thought and compulsive checking of kids.  Previous psych med trials include sertraline, fluoxetine, lamotrigine which caused a rash twice, clonazepam which caused her to be sleepy and have a headache, Pristiq which caused her to "jump out of my skin", Lexapro, Abilify with muscle twitches, buspirone uncontrollable crying, citalopram, Seroquel, prepped pramipexole which cause muscle twitching, Nuvigil, Viibryd which was ineffective, Latuda which was ineffective, Deplin, bupropion which increased her anxiety, gabapentin which caused her to feel tired and loopy, hydroxyzine which cause dizziness, duloxetine, mirazapine 60mg  helped anxiety but cause weight gain, trazodone joint pain, lithium 900 tremor. Buspirone with celexa.  Poor response to SSRI. Did another ketamine infusion Nov 17 without much improvement. Hosp for SI wtith plan to cut wrists, wrote a sui note and H stopped her on Aug 25 and hosp then until 8/29 and started PHP for 3 weeks. Helped some. Then Talty did IV infusion #6 from9-17 to 9-26.  Resolved SI and depression much improved.    Review of Systems:  Review of Systems  Constitutional: Negative for appetite change.  Gastrointestinal: Negative for abdominal distention, constipation and nausea.  Genitourinary: Positive for frequency and urgency.  Neurological: Negative for dizziness and tremors.  Psychiatric/Behavioral: Negative for agitation, behavioral problems, confusion, decreased concentration, hallucinations, sleep disturbance and suicidal  ideas. The patient is not nervous/anxious and is not hyperactive.     Medications: I have reviewed the patient's current medications.  Current Outpatient Medications  Medication Sig Dispense Refill  . cetirizine (ZYRTEC) 10 MG tablet Take 10 mg by mouth daily.    Marland Kitchen estradiol (VIVELLE-DOT) 0.075 MG/24HR Place 1 patch onto the skin 2 (two) times a week.     .  levothyroxine (SYNTHROID) 25 MCG tablet 50 mcg.     . liothyronine (CYTOMEL) 5 MCG tablet Take 5 mcg by mouth 2 (two) times daily.     Marland Kitchen lithium carbonate 300 MG capsule TAKE 2 CAPSULES (600 MG TOTAL) BY MOUTH AT BEDTIME. 180 capsule 0  . Magnesium Sulfate 70 MG CAPS Take by mouth.    . TRINTELLIX 20 MG TABS tablet TAKE 1 TABLET BY MOUTH EVERY DAY (Patient taking differently: Take 20 mg by mouth daily. ) 30 tablet 2  . valACYclovir (VALTREX) 500 MG tablet valacyclovir 500 mg tablet  TAKE 1 TABLET BY MOUTH EVERY DAY    . vortioxetine HBr (TRINTELLIX) 10 MG TABS tablet Take 1 tablet (10 mg total) by mouth daily. 30 tablet 1  . vortioxetine HBr (TRINTELLIX) 10 MG TABS tablet Take 10 mg by mouth daily.    Marland Kitchen lisdexamfetamine (VYVANSE) 20 MG capsule Take 1 capsule (20 mg total) by mouth daily. (Patient not taking: Reported on 08/09/2019) 30 capsule 0  . lisdexamfetamine (VYVANSE) 20 MG capsule Take 1 capsule (20 mg total) by mouth daily. (Patient not taking: Reported on 08/09/2019) 30 capsule 0  . lisdexamfetamine (VYVANSE) 20 MG capsule Take 1 capsule (20 mg total) by mouth daily. (Patient not taking: Reported on 08/09/2019) 30 capsule 0  . lithium carbonate 150 MG capsule Take 1 capsule (150 mg total) by mouth 3 (three) times daily with meals. 90 capsule 0   No current facility-administered medications for this visit.     Medication Side Effects:   Dry mouth, worse constipation.  Allergies:  Allergies  Allergen Reactions  . Lexapro [Escitalopram Oxalate] Other (See Comments)    Insomnia   . Pristiq [Desvenlafaxine]     Dry heaves/dizzy  . Prozac [Fluoxetine Hcl]     HA and Strange Dreams  . Septra Ds [Sulfamethoxazole-Trimethoprim]     Jitteriness/hyper/affecting sleep  . Penicillins Rash    Childhood rash    Past Medical History:  Diagnosis Date  . Anemia   . Anxiety   . ASCUS on Pap smear   . Bacterial infection   . Bladder spasm   . Blood in stool   . Cancer ([place])    Basal  Cell Skin Cancer  . Candida vaginitis   . Cervical dysplasia   . Colon polyp    tubular adenoma  . Depression   . Dizziness 04/05/2016  . Dysmenorrhea   . Endometrial polyp   . Fatigue   . Genital warts   . Hemorrhoids   . Herpes   . History of chicken pox   . HPV (human papilloma virus) infection   . Low blood pressure   . Lyme disease   . Menorrhagia   . Orthostatic hypotension 04/05/2016  . Osteopenia   . Ovarian cyst   . PMDD (premenstrual dysphoric disorder)   . POTS (postural orthostatic tachycardia syndrome) 04/05/2016  . Rectal bleed   . Urinary frequency   . Urine incontinence   . Yeast infection     Family History  Problem Relation Age of Onset  . Prostate cancer Father   .  Hypertension Father   . Renal cancer Father   . Migraines Mother   . Alcohol abuse Mother   . Suicidality Mother        deceased  . Colon cancer Paternal Grandfather   . Dementia Paternal Grandmother   . Thyroid disease Maternal Grandmother   . Rheum arthritis Maternal Grandmother   . Diabetes Maternal Grandfather   . Anxiety disorder Sister        x 2    Social History   Socioeconomic History  . Marital status: Married    Spouse name: Jaci Standard   . Number of children: 3  . Years of education: college  . Highest education level: Not on file  Occupational History  . Not on file  Social Needs  . Financial resource strain: Not hard at all  . Food insecurity    Worry: Never true    Inability: Never true  . Transportation needs    Medical: No    Non-medical: No  Tobacco Use  . Smoking status: Never Smoker  . Smokeless tobacco: Never Used  Substance and Sexual Activity  . Alcohol use: Yes    Alcohol/week: 0.0 standard drinks    Comment: 1-2 drinks/week  . Drug use: No  . Sexual activity: Yes    Partners: Male    Birth control/protection: None    Comment: vasectony  Lifestyle  . Physical activity    Days per week: 5 days    Minutes per session: 30 min  . Stress: Not at  all  Relationships  . Social connections    Talks on phone: More than three times a week    Gets together: More than three times a week    Attends religious service: More than 4 times per year    Active member of club or organization: No    Attends meetings of clubs or organizations: Never    Relationship status: Married  . Intimate partner violence    Fear of current or ex partner: No    Emotionally abused: No    Physically abused: No    Forced sexual activity: No  Other Topics Concern  . Not on file  Social History Narrative   Drinks 1 cup of coffee a day    M hx multiple SA and died of Suicide at 13 yo.  Her aunt also. 2 of 3 kids on Vyvanse and done well. Past Medical History, Surgical history, Social history, and Family history were reviewed and updated as appropriate.   Please see review of systems for further details on the patient's review from today.   Objective:   Physical Exam:  There were no vitals taken for this visit.  Physical Exam Constitutional:      General: She is not in acute distress.    Appearance: She is well-developed.  Musculoskeletal:        General: No deformity.  Neurological:     Mental Status: She is alert and oriented to person, place, and time.     Cranial Nerves: No dysarthria.     Coordination: Coordination normal.  Psychiatric:        Attention and Perception: Attention and perception normal. She does not perceive auditory or visual hallucinations.        Mood and Affect: Mood is not depressed. Affect is not labile, blunt, angry or inappropriate.        Speech: Speech normal.        Behavior: Behavior normal. Behavior is cooperative.  Thought Content: Thought content normal. Thought content is not paranoid or delusional. Thought content does not include homicidal or suicidal ideation. Thought content does not include homicidal or suicidal plan.        Cognition and Memory: Cognition and memory normal.        Judgment: Judgment  normal.     Comments: Overall doing very well with regard to mood.  Dramatically better than last year.     Lab Review:     Component Value Date/Time   NA 137 10/31/2018 1422   K 4.4 10/31/2018 1422   CL 98 10/31/2018 1422   CO2 25 10/31/2018 1422   GLUCOSE 88 10/31/2018 1422   GLUCOSE 134 (H) 06/18/2018 1821   BUN 16 10/31/2018 1422   CREATININE 0.67 10/31/2018 1422   CALCIUM 9.9 10/31/2018 1422   PROT 7.1 06/18/2018 1821   PROT 7.6 03/31/2016 0932   ALBUMIN 4.6 06/18/2018 1821   ALBUMIN 5.1 03/31/2016 0932   AST 20 06/18/2018 1821   ALT 16 06/18/2018 1821   ALKPHOS 40 06/18/2018 1821   BILITOT 0.6 06/18/2018 1821   BILITOT 0.4 03/31/2016 0932   GFRNONAA 101 10/31/2018 1422   GFRAA 116 10/31/2018 1422       Component Value Date/Time   WBC 7.1 06/18/2018 1821   RBC 4.33 06/18/2018 1821   HGB 13.6 06/18/2018 1821   HCT 40.6 06/18/2018 1821   PLT 309 06/18/2018 1821   MCV 93.8 06/18/2018 1821   MCH 31.4 06/18/2018 1821   MCHC 33.5 06/18/2018 1821   RDW 12.6 06/18/2018 1821   LYMPHSABS 1.2 11/07/2012 0915   MONOABS 0.4 11/07/2012 0915   EOSABS 0.0 11/07/2012 0915   BASOSABS 0.0 11/07/2012 0915    Lithium Lvl  Date Value Ref Range Status  10/31/2018 0.4 (L) 0.6 - 1.2 mmol/L Final    Comment:                                     Detection Limit = 0.1                           <0.1 indicates None Detected    on 600mg /D  No results found for: PHENYTOIN, PHENOBARB, VALPROATE, CBMZ   .res Assessment: Plan:    Marsena was seen today for follow-up, depression and medication problem.  Diagnoses and all orders for this visit:  Recurrent major depression resistant to treatment ([place]) -     Lithium level -     TSH -     lithium carbonate 150 MG capsule; Take 1 capsule (150 mg total) by mouth 3 (three) times daily with meals.  Lithium use -     Lithium level -     TSH  Generalized anxiety disorder  Attention deficit hyperactivity disorder (ADHD), predominantly  inattentive type  Lithium-induced tremor  RO some OCD   We discussed her severe treatment resistant major depression and anxiety.  We reviewed the long list of failed psychiatric medications in detail.  Many of which were more intolerances and did not receive adequate trials.  Explained to her in detail that at this point it is very unlikely that we will achieve good control of her symptoms without some degree of side effect.  She is taken all the things with the least side effect risk.  Her symptoms have been very severe and need to  be controlled.  Overall she is better than before the current meds.  She doubts benefit from lithium.  DT urinary SE and weight gain, wants to try reducing lithium.  Reduce to 450 mg daily. Disc risk relapse.  Optional Vyvanse augmentation for depression and attention and focus and productivity 20 mg daily.  Received alittle more benefit for mood, energy, productivity.    He has a strong family history of ADD and to her for her 3 children. Discussed potential benefits, risks, and side effects of stimulants with patient to include increased heart rate, palpitations, insomnia, increased anxiety, increased irritability, or decreased appetite.  Instructed patient to contact office if experiencing any significant tolerability issues. Alternative Mydayis due to evening rebound hunger.  Continue DBT  Counseled patient regarding potential benefits, risks, and side effects of lithium to include potential risk of lithium affecting thyroid and renal function.  Discussed need for periodic lab monitoring to determine drug level and to assess for potential adverse effects.  Counseled patient regarding signs and symptoms of lithium toxicity and advised that they notify office immediately or seek urgent medical attention if experiencing these signs and symptoms.  Patient advised to contact office with any questions or concerns.  Other Options, olanzepine, TCA, MAOI in detail, change  to trazodone DT weight, increase lithium, Spravato, Vraylar.  Discussed each of these options in detail including the side effect risks.  We will proceed with the wants with the least side effect risks and move up as needed.  Anxiety has been ok off mirtazepine.    Continue Trintellix to 30 mg (20mg  + 10mg ) daily above the usual max dosage for TRD.  Option naltrexone for craving if needed.,  This appt was 30 mins.  3 mos.  She agreed with the plan.  Lynder Parents, MD, DFAPA  Future Appointments  Date Time Provider [place]  10/10/2019  3:30 PM Cottle, Billey Co., MD [facility] None    Orders Placed This Encounter  Procedures  . Lithium level  . TSH

## 2019-08-11 ENCOUNTER — Other Ambulatory Visit: Payer: Self-pay | Admitting: Psychiatry

## 2019-08-21 ENCOUNTER — Other Ambulatory Visit: Payer: Self-pay | Admitting: Psychiatry

## 2019-08-28 LAB — LITHIUM LEVEL: Lithium Lvl: 0.3 mmol/L — ABNORMAL LOW (ref 0.6–1.2)

## 2019-08-28 LAB — TSH: TSH: 0.2 u[IU]/mL — ABNORMAL LOW (ref 0.450–4.500)

## 2019-08-29 ENCOUNTER — Other Ambulatory Visit: Payer: Self-pay

## 2019-08-30 ENCOUNTER — Other Ambulatory Visit: Payer: Self-pay

## 2019-08-31 ENCOUNTER — Other Ambulatory Visit: Payer: Self-pay

## 2019-08-31 MED ORDER — VORTIOXETINE HBR 10 MG PO TABS: 10.0000 mg | ORAL_TABLET | Freq: Every day | ORAL | 1 refills | Status: DC

## 2019-09-01 ENCOUNTER — Other Ambulatory Visit: Payer: Self-pay | Admitting: Psychiatry

## 2019-09-01 DIAGNOSIS — F339 Major depressive disorder, recurrent, unspecified: Secondary | ICD-10-CM

## 2019-09-18 ENCOUNTER — Other Ambulatory Visit: Payer: Self-pay

## 2019-09-18 DIAGNOSIS — Z20822 Contact with and (suspected) exposure to covid-19: Secondary | ICD-10-CM

## 2019-09-20 LAB — NOVEL CORONAVIRUS, NAA: SARS-CoV-2, NAA: NOT DETECTED

## 2019-09-24 ENCOUNTER — Other Ambulatory Visit: Payer: Self-pay

## 2019-09-24 DIAGNOSIS — Z20822 Contact with and (suspected) exposure to covid-19: Secondary | ICD-10-CM

## 2019-09-26 ENCOUNTER — Ambulatory Visit: Payer: BLUE CROSS/BLUE SHIELD | Admitting: Gastroenterology

## 2019-09-26 LAB — NOVEL CORONAVIRUS, NAA: SARS-CoV-2, NAA: DETECTED — AB

## 2019-09-27 ENCOUNTER — Ambulatory Visit: Payer: Self-pay

## 2019-09-27 NOTE — Telephone Encounter (Signed)
Incoming call from Patient returning call Provided Care advice Patient voiced understanding.  Patient states tha she had a cough achy body  and headache.

## 2019-10-10 ENCOUNTER — Other Ambulatory Visit: Payer: Self-pay

## 2019-10-10 ENCOUNTER — Encounter: Payer: Self-pay | Admitting: Psychiatry

## 2019-10-10 ENCOUNTER — Ambulatory Visit (INDEPENDENT_AMBULATORY_CARE_PROVIDER_SITE_OTHER): Payer: BC Managed Care – PPO | Admitting: Psychiatry

## 2019-10-10 VITALS — Wt 171.0 lb

## 2019-10-10 DIAGNOSIS — F339 Major depressive disorder, recurrent, unspecified: Secondary | ICD-10-CM | POA: Diagnosis not present

## 2019-10-10 DIAGNOSIS — Z79899 Other long term (current) drug therapy: Secondary | ICD-10-CM

## 2019-10-10 DIAGNOSIS — F411 Generalized anxiety disorder: Secondary | ICD-10-CM

## 2019-10-10 DIAGNOSIS — F900 Attention-deficit hyperactivity disorder, predominantly inattentive type: Secondary | ICD-10-CM

## 2019-10-10 DIAGNOSIS — G251 Drug-induced tremor: Secondary | ICD-10-CM

## 2019-10-10 NOTE — Progress Notes (Signed)
Autumn Foster [id number] [birth year] 53 y.o.     Subjective:   Patient ID:  Autumn Foster is a 53 y.o. (DOB 09/03/1966) female.  Chief Complaint:  Chief Complaint  Patient presents with  . Depression    Medication Management  . Anxiety    Medication Management  . Follow-up    Medication Management    Autumn Foster presents to the office today for follow-up of TR severe, major depression and anxiety.  on December 13, 2018.  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  At visit May Vyvanse 20 added Vyvanse augmentation for depression and attention and focus and productivity 20 mg daily. Got alittle more benefit for mood, energy, productivity. More normal.   Lost most off the mirtazapine weight.  AT visit was January 24, 2019.  The patient wanted to wean off mirtazapine because of weight gain though she has had relapse when she is done this before.  At last visit in August 09, 2019.  Lithium was reduced to 450 mg daily due to side effect complaints.   She remained on Trintellix 30 mg daily and remained off Vyvanse 20 mg daily.   Repeat lithium level at that dose was 0.3.  The prior 2 levels had been 0.4 and 0.3. TSH remains stable and reduced at 0.200 on levothyroxine and Cytomel 10 mg as an augmentation strategy.  She reduced the lithium again to 300 mg daily and urination issues.  Still hungry and active  But can't lose weight and it's frustrating.  Logs her food.    Mental health is really good considering. Not depressed nor anxious.   Able to go to work.  H not travelling.  Son is employed and kids are around good.  Concern about weight.  Still always hungry and thinks about food all the time.  Wonders if it is lithium.  Eats healthy.  Exercises.  No longer gaining weight but not losing.  Plans to try keto diet.    Stopped Vyvanse bc urinary urgency and ravenous when it wore off at the end of the day.  It is a little better off the Vyvanse  20.  Urinary problem not resolved off the Vyvanse.    Sleep pretty good with brief awakenings. Almost 8 hours and seems less deep with brief awakenings. Would prefer 9 hours.  Alert during the day.  Energy a little low.  A little more anxious overall.  Likes her job which helps.  [name] with eating now.  Concentration, motivation, distractibility,  Walks 3-4 miles daily but no longer going to gym.  Feels better off the mirtazapine.  Sees integrative doctor  Also.   [employer] won't pay for Spravato.  Reviewed OCD scale with some catastrophisizing and intrusive thought and compulsive checking of kids.  Previous psych med trials include sertraline, fluoxetine, lamotrigine which caused a rash twice, clonazepam which caused her to be sleepy and have a headache, Pristiq which caused her to "jump out of my skin", Lexapro, Abilify with muscle twitches, buspirone uncontrollable crying, citalopram, Seroquel, prepped pramipexole which cause muscle twitching, Nuvigil, Viibryd which was ineffective, Latuda which was ineffective, Deplin, bupropion which increased her anxiety, gabapentin which caused her to feel tired and loopy, hydroxyzine which cause dizziness, duloxetine, mirazapine 60mg  helped anxiety but cause weight gain, trazodone joint pain, lithium 900 tremor. Buspirone with celexa.  Poor response to SSRI. Did another ketamine infusion Nov 17 without much improvement. Hosp for  SI wtith plan to cut wrists, wrote a sui note and H stopped her on Aug 25 and hosp then until 8/29 and started PHP for 3 weeks. Helped some. Then [place] did IV infusion #6 from9-17 to 9-26.  Resolved SI and depression much improved.    Review of Systems:  Review of Systems  Constitutional: Negative for appetite change.  Gastrointestinal: Negative for abdominal distention, constipation and nausea.  Genitourinary: Positive for frequency and urgency.  Neurological: Negative for dizziness and tremors.   Psychiatric/Behavioral: Negative for agitation, behavioral problems, confusion, decreased concentration, hallucinations, sleep disturbance and suicidal ideas. The patient is not nervous/anxious and is not hyperactive.     Medications: I have reviewed the patient's current medications.  Current Outpatient Medications  Medication Sig Dispense Refill  . cetirizine (ZYRTEC) 10 MG tablet Take 10 mg by mouth daily.    Marland Kitchen estradiol (VIVELLE-DOT) 0.075 MG/24HR Place 1 patch onto the skin 2 (two) times a week.     . levothyroxine (SYNTHROID) 25 MCG tablet 50 mcg.     . liothyronine (CYTOMEL) 5 MCG tablet Take 5 mcg by mouth 2 (two) times daily.     Marland Kitchen lisdexamfetamine (VYVANSE) 20 MG capsule Take 1 capsule (20 mg total) by mouth daily. (Patient not taking: Reported on 08/09/2019) 30 capsule 0  . lisdexamfetamine (VYVANSE) 20 MG capsule Take 1 capsule (20 mg total) by mouth daily. (Patient not taking: Reported on 08/09/2019) 30 capsule 0  . lisdexamfetamine (VYVANSE) 20 MG capsule Take 1 capsule (20 mg total) by mouth daily. (Patient not taking: Reported on 08/09/2019) 30 capsule 0  . lithium carbonate 150 MG capsule TAKE 1 CAPSULE (150 MG TOTAL) BY MOUTH 3 (THREE) TIMES DAILY WITH MEALS. 270 capsule 1  . lithium carbonate 300 MG capsule TAKE 2 CAPSULES (600 MG TOTAL) BY MOUTH AT BEDTIME. 180 capsule 0  . Magnesium Sulfate 70 MG CAPS Take by mouth.    . TRINTELLIX 20 MG TABS tablet TAKE 1 TABLET BY MOUTH EVERY DAY 30 tablet 2  . valACYclovir (VALTREX) 500 MG tablet valacyclovir 500 mg tablet  TAKE 1 TABLET BY MOUTH EVERY DAY    . vortioxetine HBr (TRINTELLIX) 10 MG TABS tablet Take 1 tablet (10 mg total) by mouth daily. 30 tablet 1   No current facility-administered medications for this visit.    Medication Side Effects:   Dry mouth, worse constipation.  Allergies:  Allergies  Allergen Reactions  . Lexapro [Escitalopram Oxalate] Other (See Comments)    Insomnia   . Pristiq [Desvenlafaxine]      Dry heaves/dizzy  . Prozac [Fluoxetine Hcl]     HA and Strange Dreams  . Septra Ds [Sulfamethoxazole-Trimethoprim]     Jitteriness/hyper/affecting sleep  . Penicillins Rash    Childhood rash    Past Medical History:  Diagnosis Date  . Anemia   . Anxiety   . ASCUS on Pap smear   . Bacterial infection   . Bladder spasm   . Blood in stool   . Cancer ([place])    Basal Cell Skin Cancer  . Candida vaginitis   . Cervical dysplasia   . Colon polyp    tubular adenoma  . Depression   . Dizziness 04/05/2016  . Dysmenorrhea   . Endometrial polyp   . Fatigue   . Genital warts   . Hemorrhoids   . Herpes   . History of chicken pox   . HPV (human papilloma virus) infection   . Low blood pressure   .  Lyme disease   . Menorrhagia   . Orthostatic hypotension 04/05/2016  . Osteopenia   . Ovarian cyst   . PMDD (premenstrual dysphoric disorder)   . POTS (postural orthostatic tachycardia syndrome) 04/05/2016  . Rectal bleed   . Urinary frequency   . Urine incontinence   . Yeast infection     Family History  Problem Relation Age of Onset  . Prostate cancer Father   . Hypertension Father   . Renal cancer Father   . Migraines Mother   . Alcohol abuse Mother   . Suicidality Mother        deceased  . Colon cancer Paternal Grandfather   . Dementia Paternal Grandmother   . Thyroid disease Maternal Grandmother   . Rheum arthritis Maternal Grandmother   . Diabetes Maternal Grandfather   . Anxiety disorder Sister        x 2    Social History   Socioeconomic History  . Marital status: Married    Spouse name: Jaci Standard   . Number of children: 3  . Years of education: college  . Highest education level: Not on file  Occupational History  . Not on file  Tobacco Use  . Smoking status: Never Smoker  . Smokeless tobacco: Never Used  Substance and Sexual Activity  . Alcohol use: Yes    Alcohol/week: 0.0 standard drinks    Comment: 1-2 drinks/week  . Drug use: No  . Sexual activity:  Yes    Partners: Male    Birth control/protection: None    Comment: vasectony  Other Topics Concern  . Not on file  Social History Narrative   Drinks 1 cup of coffee a day    Social Determinants of Health   Financial Resource Strain:   . Difficulty of Paying Living Expenses: Not on file  Food Insecurity:   . Worried About Charity fundraiser in the Last Year: Not on file  . Ran Out of Food in the Last Year: Not on file  Transportation Needs:   . Lack of Transportation (Medical): Not on file  . Lack of Transportation (Non-Medical): Not on file  Physical Activity:   . Days of Exercise per Week: Not on file  . Minutes of Exercise per Session: Not on file  Stress:   . Feeling of Stress : Not on file  Social Connections:   . Frequency of Communication with Friends and Family: Not on file  . Frequency of Social Gatherings with Friends and Family: Not on file  . Attends Religious Services: Not on file  . Active Member of Clubs or Organizations: Not on file  . Attends Archivist Meetings: Not on file  . Marital Status: Not on file  Intimate Partner Violence:   . Fear of Current or Ex-Partner: Not on file  . Emotionally Abused: Not on file  . Physically Abused: Not on file  . Sexually Abused: Not on file   M hx multiple SA and died of Suicide at 51 yo.  Her aunt also. 2 of 3 kids on Vyvanse and done well. Past Medical History, Surgical history, Social history, and Family history were reviewed and updated as appropriate.   Please see review of systems for further details on the patient's review from today.   Objective:   Physical Exam:  There were no vitals taken for this visit.  Physical Exam Constitutional:      General: She is not in acute distress.    Appearance: Normal appearance.  She is well-developed and normal weight.  Musculoskeletal:        General: No deformity.  Neurological:     Mental Status: She is alert and oriented to person, place, and time.      Cranial Nerves: No dysarthria.     Coordination: Coordination normal.  Psychiatric:        Attention and Perception: Attention and perception normal. She does not perceive auditory or visual hallucinations.        Mood and Affect: Mood is not depressed. Affect is not labile, blunt, angry or inappropriate.        Speech: Speech normal.        Behavior: Behavior normal. Behavior is cooperative.        Thought Content: Thought content normal. Thought content is not paranoid or delusional. Thought content does not include homicidal or suicidal ideation. Thought content does not include homicidal or suicidal plan.        Cognition and Memory: Cognition and memory normal.        Judgment: Judgment normal.     Comments: Overall doing very well with regard to mood.  Dramatically better than last year.     Lab Review:     Component Value Date/Time   NA 137 10/31/2018 1422   K 4.4 10/31/2018 1422   CL 98 10/31/2018 1422   CO2 25 10/31/2018 1422   GLUCOSE 88 10/31/2018 1422   GLUCOSE 134 (H) 06/18/2018 1821   BUN 16 10/31/2018 1422   CREATININE 0.67 10/31/2018 1422   CALCIUM 9.9 10/31/2018 1422   PROT 7.1 06/18/2018 1821   PROT 7.6 03/31/2016 0932   ALBUMIN 4.6 06/18/2018 1821   ALBUMIN 5.1 03/31/2016 0932   AST 20 06/18/2018 1821   ALT 16 06/18/2018 1821   ALKPHOS 40 06/18/2018 1821   BILITOT 0.6 06/18/2018 1821   BILITOT 0.4 03/31/2016 0932   GFRNONAA 101 10/31/2018 1422   GFRAA 116 10/31/2018 1422       Component Value Date/Time   WBC 7.1 06/18/2018 1821   RBC 4.33 06/18/2018 1821   HGB 13.6 06/18/2018 1821   HCT 40.6 06/18/2018 1821   PLT 309 06/18/2018 1821   MCV 93.8 06/18/2018 1821   MCH 31.4 06/18/2018 1821   MCHC 33.5 06/18/2018 1821   RDW 12.6 06/18/2018 1821   LYMPHSABS 1.2 11/07/2012 0915   MONOABS 0.4 11/07/2012 0915   EOSABS 0.0 11/07/2012 0915   BASOSABS 0.0 11/07/2012 0915    Lithium Lvl  Date Value Ref Range Status  08/27/2019 0.3 (L) 0.6 - 1.2 mmol/L  Final    Comment:                                     Detection Limit = 0.1                           <0.1 indicates None Detected    on 600mg /D  No results found for: PHENYTOIN, PHENOBARB, VALPROATE, CBMZ   .res Assessment: Plan:    There are no diagnoses linked to this encounter.   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  We reviewed the long list of failed psychiatric medications in detail.  Many of which were more intolerances and did not receive adequate trials.  Explained to her in detail that at this point it  is very unlikely that we will achieve good control of her symptoms without some degree of side effect.  She is taken all the things with the least side effect risk.  Her symptoms have been very severe and need to be controlled.  Overall she is better than before the current meds.  She doubts benefit from lithium.  No benefit with reduction of lithium in  urinary SE and weight gain,  Therefore, Increase back 450 mg daily. Disc risk relapse.  Stoppped Optional Vyvanse augmentation for depression and attention and focus and productivity 20 mg daily.  Received alittle more benefit for mood, energy, productivity.    but not really worth it.  She has a strong family history of ADD and to her for her 3 children. Discussed potential benefits, risks, and side effects of stimulants with patient to include increased heart rate, palpitations, insomnia, increased anxiety, increased irritability, or decreased appetite.  Instructed patient to contact office if experiencing any significant tolerability issues. Alternative Mydayis due to evening rebound hunger.  Continue DBT  Counseled patient regarding potential benefits, risks, and side effects of lithium to include potential risk of lithium affecting thyroid and renal function.  Discussed need for periodic lab monitoring to determine drug level and to assess for potential adverse effects.   Counseled patient regarding signs and symptoms of lithium toxicity and advised that they notify office immediately or seek urgent medical attention if experiencing these signs and symptoms.  Patient advised to contact office with any questions or concerns.  Other Options, olanzepine, TCA, MAOI in detail, change to trazodone DT weight, increase lithium, Spravato, Vraylar.  Discussed each of these options in detail including the side effect risks.  We will proceed with the wants with the least side effect risks and move up as needed.  Anxiety has been ok off mirtazepine.    Continue Trintellix to 30 mg (20mg  + 10mg ) daily above the usual max dosage for TRD.  Reviewed labs.    Option naltrexone for craving if needed., She's not interested.  This appt was 30 mins.  3-4 mos.  She agreed with the plan.  Lynder Parents, MD, DFAPA  Future Appointments  Date Time Provider [place]  11/05/2019  1:30 PM Ladene Artist, MD [facility]    No orders of the defined types were placed in this encounter.

## 2019-10-12 ENCOUNTER — Other Ambulatory Visit: Payer: Self-pay | Admitting: Psychiatry

## 2019-10-23 ENCOUNTER — Other Ambulatory Visit: Payer: Self-pay | Admitting: Psychiatry

## 2019-10-23 DIAGNOSIS — F332 Major depressive disorder, recurrent severe without psychotic features: Secondary | ICD-10-CM

## 2019-10-23 NOTE — Telephone Encounter (Signed)
Looks like Emmalyne should be on 450 mg but I only see the 300 mg capsules

## 2019-11-03 ENCOUNTER — Other Ambulatory Visit: Payer: Self-pay | Admitting: Psychiatry

## 2019-11-05 ENCOUNTER — Ambulatory Visit: Payer: Self-pay | Admitting: Gastroenterology

## 2020-01-26 ENCOUNTER — Other Ambulatory Visit: Payer: Self-pay | Admitting: Psychiatry

## 2020-02-13 ENCOUNTER — Encounter: Payer: Self-pay | Admitting: Psychiatry

## 2020-02-13 ENCOUNTER — Ambulatory Visit (INDEPENDENT_AMBULATORY_CARE_PROVIDER_SITE_OTHER): Payer: BC Managed Care – PPO | Admitting: Psychiatry

## 2020-02-13 ENCOUNTER — Other Ambulatory Visit: Payer: Self-pay

## 2020-02-13 DIAGNOSIS — F339 Major depressive disorder, recurrent, unspecified: Secondary | ICD-10-CM | POA: Diagnosis not present

## 2020-02-13 DIAGNOSIS — F411 Generalized anxiety disorder: Secondary | ICD-10-CM | POA: Diagnosis not present

## 2020-02-13 DIAGNOSIS — F900 Attention-deficit hyperactivity disorder, predominantly inattentive type: Secondary | ICD-10-CM | POA: Diagnosis not present

## 2020-02-13 MED ORDER — LORAZEPAM 0.5 MG PO TABS: 0.5000 mg | ORAL_TABLET | Freq: Three times a day (TID) | ORAL | 0 refills | Status: DC

## 2020-02-13 NOTE — Progress Notes (Signed)
Autumn Foster:[id number] June 18, 1966 54 y.o.     Subjective:   Patient ID:  Autumn Foster is a 54 y.o. (DOB 09/16/1966) female.  Chief Complaint:  No chief complaint on file.   Autumn Foster presents to the office today for follow-up of TR severe, major depression and anxiety.  on December 13, 2018.  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  At visit May Vyvanse 20 added Vyvanse augmentation for depression and attention and focus and productivity 20 mg daily. Got alittle more benefit for mood, energy, productivity. More normal.   Lost most off the mirtazapine weight.  AT visit January 24, 2019.  The patient wanted to wean off mirtazapine because of weight gain though she has had relapse when she is done this before.  At visit in August 09, 2019.  Lithium was reduced to 450 mg daily due to side effect complaints.   She remained on Trintellix 30 mg daily and remained off Vyvanse 20 mg daily.   Repeat lithium level at that dose was 0.3.  The prior 2 levels had been 0.4 and 0.3. TSH remains stable and reduced at 0.200 on levothyroxine and Cytomel 10 mg as an augmentation strategy.  Last visit December 2020.  The following was noted: She reduced the lithium again to 300 mg daily and urination issues.  Still hungry and active  But can't lose weight and it's frustrating.  Logs her food.   Mental health is really good considering. Not depressed nor anxious.   Able to go to work.  Stopped Vyvanse bc urinary urgency and ravenous when it wore off at the end of the day.  It is a little better off the Vyvanse 20.  Urinary problem not resolved off the Vyvanse.   Plan:No benefit with reduction of lithium in  urinary SE and weight gain,  Therefore, Increase back 450 mg daily.  February 13, 2020 appointment, the following is noted: She increased lithium to 450 mg for a couple of mos and didn't notice any change so reduced the lithium to 300 mg daily again.  Able to  lose to 166# but wants to be at 162#.  Keto diet helped.  Gotten my self control back. No depression.  Always a little anxiety and still doing DBT.  Feels a little racy.  Sleep well.  Tired most of the time. No coffee bc it wires her.  Grateful to be better.  Sees integrative doctor  Also.   [employer] won't pay for [place].  Reviewed OCD scale with some catastrophisizing and intrusive thought and compulsive checking of kids.  Previous psych med trials include Poor response to SSRI. sertraline, fluoxetine,  Lexapro, citalopram,duloxetine, mirazapine 60mg  helped anxiety but cause weight gain, Viibryd which was ineffective, Deplin, bupropion which increased her anxiety, Buspirone with celexa.   lithium 900 tremor. lamotrigine which caused a rash twice,  clonazepam which caused her to be sleepy and have a headache, Pristiq which caused her to "jump out of my skin",  Abilify with muscle twitches, buspirone uncontrollable crying,  Seroquel, pramipexole which cause muscle twitching, Latuda which was ineffective, Nuvigil, gabapentin which caused her to feel tired and loopy, hydroxyzine which cause dizziness,  trazodone joint pain,    Hosp for SI wtith plan to cut wrists, wrote a sui note and H stopped her on Aug 25 and hosp then until 8/29 and started PHP for 3 weeks. Helped some. Then [facility] did IV infusion #6  from9-17 to 9-26.  Resolved SI and depression much improved.  Did another ketamine infusion Sep 10, 2018 without much improvement.  Review of Systems:  Review of Systems  Constitutional: Negative for appetite change.  Gastrointestinal: Negative for abdominal distention, constipation and nausea.  Genitourinary: Positive for frequency and urgency.  Neurological: Negative for dizziness and tremors.  Psychiatric/Behavioral: Negative for agitation, behavioral problems, confusion, decreased concentration, hallucinations, sleep disturbance and suicidal ideas. The patient is not  nervous/anxious and is not hyperactive.     Medications: I have reviewed the patient's current medications.  Current Outpatient Medications  Medication Sig Dispense Refill  . cetirizine (ZYRTEC) 10 MG tablet Take 10 mg by mouth daily.    Marland Kitchen estradiol (VIVELLE-DOT) 0.075 MG/24HR Place 1 patch onto the skin 2 (two) times a week.     . levothyroxine (SYNTHROID) 88 MCG tablet 88 mcg.     . liothyronine (CYTOMEL) 5 MCG tablet Take 5 mcg by mouth 2 (two) times daily.     Marland Kitchen lithium carbonate 150 MG capsule Take 3 capsules (450 mg total) by mouth at bedtime. 270 capsule 0  . Magnesium Sulfate 70 MG CAPS Take by mouth.    . TRINTELLIX 10 MG TABS tablet TAKE 1 TABLET BY MOUTH EVERY DAY 30 tablet 5  . TRINTELLIX 20 MG TABS tablet TAKE 1 TABLET BY MOUTH EVERY DAY 30 tablet 2  . valACYclovir (VALTREX) 500 MG tablet valacyclovir 500 mg tablet  TAKE 1 TABLET BY MOUTH EVERY DAY     No current facility-administered medications for this visit.    Medication Side Effects:   Dry mouth, worse constipation.  Allergies:  Allergies  Allergen Reactions  . Lexapro [Escitalopram Oxalate] Other (See Comments)    Insomnia   . Pristiq [Desvenlafaxine]     Dry heaves/dizzy  . Prozac [Fluoxetine Hcl]     HA and Strange Dreams  . Septra Ds [Sulfamethoxazole-Trimethoprim]     Jitteriness/hyper/affecting sleep  . Penicillins Rash    Childhood rash    Past Medical History:  Diagnosis Date  . Anemia   . Anxiety   . ASCUS on Pap smear   . Bacterial infection   . Bladder spasm   . Blood in stool   . Cancer ([place])    Basal Cell Skin Cancer  . Candida vaginitis   . Cervical dysplasia   . Colon polyp    tubular adenoma  . Depression   . Dizziness 04/05/2016  . Dysmenorrhea   . Endometrial polyp   . Fatigue   . Genital warts   . Hemorrhoids   . Herpes   . History of chicken pox   . HPV (human papilloma virus) infection   . Low blood pressure   . Lyme disease   . Menorrhagia   . Orthostatic  hypotension 04/05/2016  . Osteopenia   . Ovarian cyst   . PMDD (premenstrual dysphoric disorder)   . POTS (postural orthostatic tachycardia syndrome) 04/05/2016  . Rectal bleed   . Urinary frequency   . Urine incontinence   . Yeast infection     Family History  Problem Relation Age of Onset  . Prostate cancer Father   . Hypertension Father   . Renal cancer Father   . Migraines Mother   . Alcohol abuse Mother   . Suicidality Mother        deceased  . Colon cancer Paternal Grandfather   . Dementia Paternal Grandmother   . Thyroid disease Maternal Grandmother   . Rheum  arthritis Maternal Grandmother   . Diabetes Maternal Grandfather   . Anxiety disorder Sister        x 2    Social History   Socioeconomic History  . Marital status: Married    Spouse name: Jaci Standard   . Number of children: 3  . Years of education: college  . Highest education level: Not on file  Occupational History  . Not on file  Tobacco Use  . Smoking status: Never Smoker  . Smokeless tobacco: Never Used  Substance and Sexual Activity  . Alcohol use: Yes    Alcohol/week: 0.0 standard drinks    Comment: 1-2 drinks/week  . Drug use: No  . Sexual activity: Yes    Partners: Male    Birth control/protection: None    Comment: vasectony  Other Topics Concern  . Not on file  Social History Narrative   Drinks 1 cup of coffee a day    Social Determinants of Health   Financial Resource Strain:   . Difficulty of Paying Living Expenses:   Food Insecurity:   . Worried About Charity fundraiser in the Last Year:   . Arboriculturist in the Last Year:   Transportation Needs:   . Film/video editor (Medical):   Marland Kitchen Lack of Transportation (Non-Medical):   Physical Activity:   . Days of Exercise per Week:   . Minutes of Exercise per Session:   Stress:   . Feeling of Stress :   Social Connections:   . Frequency of Communication with Friends and Family:   . Frequency of Social Gatherings with Friends  and Family:   . Attends Religious Services:   . Active Member of Clubs or Organizations:   . Attends Archivist Meetings:   Marland Kitchen Marital Status:   Intimate Partner Violence:   . Fear of Current or Ex-Partner:   . Emotionally Abused:   Marland Kitchen Physically Abused:   . Sexually Abused:    M hx multiple SA and died of Suicide at 30 yo.  Her aunt also. 2 of 3 kids on Vyvanse and done well. Past Medical History, Surgical history, Social history, and Family history were reviewed and updated as appropriate.   Please see review of systems for further details on the patient's review from today.   Objective:   Physical Exam:  There were no vitals taken for this visit.  Physical Exam Constitutional:      General: She is not in acute distress.    Appearance: Normal appearance. She is well-developed and normal weight.  Musculoskeletal:        General: No deformity.  Neurological:     Mental Status: She is alert and oriented to person, place, and time.     Cranial Nerves: No dysarthria.     Coordination: Coordination normal.  Psychiatric:        Attention and Perception: Attention and perception normal. She does not perceive auditory or visual hallucinations.        Mood and Affect: Mood is anxious. Mood is not depressed. Affect is not labile, blunt, angry or inappropriate.        Speech: Speech normal.        Behavior: Behavior normal. Behavior is cooperative.        Thought Content: Thought content normal. Thought content is not paranoid or delusional. Thought content does not include homicidal or suicidal ideation. Thought content does not include homicidal or suicidal plan.  Cognition and Memory: Cognition and memory normal.        Judgment: Judgment normal.     Comments: Overall doing very well with regard to mood.  Dramatically better than 2019.     Lab Review:     Component Value Date/Time   NA 137 10/31/2018 1422   K 4.4 10/31/2018 1422   CL 98 10/31/2018 1422   CO2  25 10/31/2018 1422   GLUCOSE 88 10/31/2018 1422   GLUCOSE 134 (H) 06/18/2018 1821   BUN 16 10/31/2018 1422   CREATININE 0.67 10/31/2018 1422   CALCIUM 9.9 10/31/2018 1422   PROT 7.1 06/18/2018 1821   PROT 7.6 03/31/2016 0932   ALBUMIN 4.6 06/18/2018 1821   ALBUMIN 5.1 03/31/2016 0932   AST 20 06/18/2018 1821   ALT 16 06/18/2018 1821   ALKPHOS 40 06/18/2018 1821   BILITOT 0.6 06/18/2018 1821   BILITOT 0.4 03/31/2016 0932   GFRNONAA 101 10/31/2018 1422   GFRAA 116 10/31/2018 1422       Component Value Date/Time   WBC 7.1 06/18/2018 1821   RBC 4.33 06/18/2018 1821   HGB 13.6 06/18/2018 1821   HCT 40.6 06/18/2018 1821   PLT 309 06/18/2018 1821   MCV 93.8 06/18/2018 1821   MCH 31.4 06/18/2018 1821   MCHC 33.5 06/18/2018 1821   RDW 12.6 06/18/2018 1821   LYMPHSABS 1.2 11/07/2012 0915   MONOABS 0.4 11/07/2012 0915   EOSABS 0.0 11/07/2012 0915   BASOSABS 0.0 11/07/2012 0915    Lithium Lvl  Date Value Ref Range Status  08/27/2019 0.3 (L) 0.6 - 1.2 mmol/L Final    Comment:                                     Detection Limit = 0.1                           <0.1 indicates None Detected    on 600mg /D  No results found for: PHENYTOIN, PHENOBARB, VALPROATE, CBMZ   .res Assessment: Plan:    Diagnoses and all orders for this visit:  Recurrent major depression resistant to treatment ([place])  Generalized anxiety disorder  Attention deficit hyperactivity disorder (ADHD), predominantly inattentive type     We discussed her severe treatment resistant major depression and anxiety.  We reviewed the long list of failed psychiatric medications in detail.  Many of which were more intolerances and did not receive adequate trials.   She is taken all the things with the least side effect risk.  Her symptoms have been very severe and fortunately have now been controlled for many months.  Overall she is better than before the current meds.  She historically has doubted the benefit of lithium  but in the opinion of the physician this low-dose lithium is likely helping significantly.  No benefit with reduction of lithium in  urinary SE and weight gain,  Therefore, Increase back 450 mg daily. Disc risk relapse. She wants to stay at 300 mg lithium daily.  If she sees any increase in depressive symptoms she was strongly encouraged to increase it back to 450 mg daily.  Continue DBT  Counseled patient regarding potential benefits, risks, and side effects of lithium to include potential risk of lithium affecting thyroid and renal function.  Discussed need for periodic lab monitoring to determine drug level and to assess for potential  adverse effects.  Counseled patient regarding signs and symptoms of lithium toxicity and advised that they notify office immediately or seek urgent medical attention if experiencing these signs and symptoms.  Patient advised to contact office with any questions or concerns. She is taking a very tiny dose of lithium and labs are unnecessary at this time  Continue Trintellix to 30 mg (20mg  + 10mg ) daily above the usual max dosage for TRD.   As for refill of lorazepam 0.5 mg every 8 hours as needed anxiety.  She uses it rarely.  4-6 mos.  She agreed with the plan.  Lynder Parents, MD, DFAPA  No future appointments.  No orders of the defined types were placed in this encounter.

## 2020-04-14 ENCOUNTER — Other Ambulatory Visit: Payer: Self-pay | Admitting: Psychiatry

## 2020-04-22 ENCOUNTER — Other Ambulatory Visit: Payer: Self-pay | Admitting: Psychiatry

## 2020-07-05 ENCOUNTER — Other Ambulatory Visit: Payer: Self-pay | Admitting: Psychiatry

## 2020-07-17 ENCOUNTER — Ambulatory Visit: Payer: BC Managed Care – PPO | Admitting: Psychiatry

## 2020-08-19 ENCOUNTER — Other Ambulatory Visit (INDEPENDENT_AMBULATORY_CARE_PROVIDER_SITE_OTHER): Payer: BC Managed Care – PPO

## 2020-08-19 ENCOUNTER — Encounter: Payer: Self-pay | Admitting: Gastroenterology

## 2020-08-19 ENCOUNTER — Ambulatory Visit: Payer: BC Managed Care – PPO | Admitting: Gastroenterology

## 2020-08-19 VITALS — BP 80/60 | HR 78 | Ht 72.0 in | Wt 167.0 lb

## 2020-08-19 DIAGNOSIS — R194 Change in bowel habit: Secondary | ICD-10-CM

## 2020-08-19 DIAGNOSIS — R1011 Right upper quadrant pain: Secondary | ICD-10-CM

## 2020-08-19 LAB — LIPASE: Lipase: 39 U/L (ref 11.0–59.0)

## 2020-08-19 LAB — CBC WITH DIFFERENTIAL/PLATELET
Basophils Absolute: 0 10*3/uL (ref 0.0–0.1)
Basophils Relative: 0.3 % (ref 0.0–3.0)
Eosinophils Absolute: 0 10*3/uL (ref 0.0–0.7)
Eosinophils Relative: 0.8 % (ref 0.0–5.0)
HCT: 40.3 % (ref 36.0–46.0)
Hemoglobin: 13.3 g/dL (ref 12.0–15.0)
Lymphocytes Relative: 28.2 % (ref 12.0–46.0)
Lymphs Abs: 1.7 10*3/uL (ref 0.7–4.0)
MCHC: 33 g/dL (ref 30.0–36.0)
MCV: 93.2 fl (ref 78.0–100.0)
Monocytes Absolute: 0.5 10*3/uL (ref 0.1–1.0)
Monocytes Relative: 7.9 % (ref 3.0–12.0)
Neutro Abs: 3.7 10*3/uL (ref 1.4–7.7)
Neutrophils Relative %: 62.8 % (ref 43.0–77.0)
Platelets: 290 10*3/uL (ref 150.0–400.0)
RBC: 4.33 Mil/uL (ref 3.87–5.11)
RDW: 12.9 % (ref 11.5–15.5)
WBC: 5.9 10*3/uL (ref 4.0–10.5)

## 2020-08-19 MED ORDER — GLYCOPYRROLATE 1 MG PO TABS: 1.0000 mg | ORAL_TABLET | Freq: Two times a day (BID) | ORAL | 11 refills | Status: DC

## 2020-08-19 NOTE — Patient Instructions (Signed)
Your provider has requested that you go to the basement level for lab work before leaving today. Press "B" on the elevator. The lab is located at the first door on the left as you exit the elevator.  We have sent the following medications to your pharmacy for you to pick up at your convenience: glycopyrrolate.  You have been scheduled for an abdominal ultrasound at [facility] (1st floor of hospital) on 08/25/20 at 8:15am. Please arrive 15 minutes prior to your appointment for registration. Make certain not to have anything to eat or drink 6 hours prior to your appointment. Should you need to reschedule your appointment, please contact radiology at [phone]. This test typically takes about 30 minutes to perform.  Due to recent changes in healthcare laws, you may see the results of your imaging and laboratory studies on MyChart before your provider has had a chance to review them.  We understand that in some cases there may be results that are confusing or concerning to you. Not all laboratory results come back in the same time frame and the provider may be waiting for multiple results in order to interpret others.  Please give [place] 48 hours in order for your provider to thoroughly review all the results before contacting the office for clarification of your results.    Thank you for choosing me and [facility].  Pricilla Riffle. Dagoberto Ligas., MD., Marval Regal

## 2020-08-19 NOTE — Progress Notes (Addendum)
History of Present Illness: This is a 54 year old female referred by Zane Herald, MD for the evaluation of RUQ abdominal pain, bloating, lower abdominal cramping, more frequent BMs.  She relates a 29-month history of right upper quadrant pain that radiates around her upper right flank to her mid back.  She has frequent abdominal bloating, lower abdominal cramping and a slight change in bowel movements with more frequent bowel movements.  She notes her stools are often hard initially and then later in the day they will be looser.  She notes she is under more stress recently as her husband may be moving to [place].  EGD with a small hiatal hernia, normal duodenal biopsies in August 2018. Colonoscopy with one small adenoma, otherwise normal in March 2018.  Glycopyrrolate was recommended following her EGD August 2018 however she does not recall taking it. CMP from 08/14/2020 showed a sodium of 132, BUN 26 otherwise normal (LFTs normal).  Denies weight loss, change in stool caliber, melena, hematochezia, nausea, vomiting, dysphagia, reflux symptoms, chest pain.    Allergies  Allergen Reactions  . Lexapro [Escitalopram Oxalate] Other (See Comments)    Insomnia   . Pristiq [Desvenlafaxine]     Dry heaves/dizzy  . Prozac [Fluoxetine Hcl]     HA and Strange Dreams  . Septra Ds [Sulfamethoxazole-Trimethoprim]     Jitteriness/hyper/affecting sleep  . Penicillins Rash    Childhood rash   Outpatient Medications Prior to Visit  Medication Sig Dispense Refill  . Cholecalciferol (VITAMIN D3) 1.25 MG (50000 UT) CAPS Take 1 capsule by mouth daily.    Marland Kitchen desmopressin (DDAVP) 0.1 MG tablet desmopressin 0.1 mg tablet  TAKE 1 TABLET BY MOUTH TWICE A DAY WITH 1 TABLET IN THE AFTERNOON AS NEEDED    . fexofenadine (ALLEGRA) 60 MG tablet Take 60 mg by mouth daily.    Marland Kitchen levothyroxine (SYNTHROID) 88 MCG tablet 88 mcg.     . liothyronine (CYTOMEL) 5 MCG tablet Take 5 mcg by mouth 2 (two) times daily. Take 3  tabs in the morning and one in the afternoon    . lithium carbonate 150 MG capsule Take 3 capsules (450 mg total) by mouth at bedtime. 270 capsule 0  . Magnesium Sulfate 70 MG CAPS Take by mouth.    . Omega-3 Fatty Acids (FISH OIL PO) Take 1 capsule by mouth daily.    . progesterone (PROMETRIUM) 100 MG capsule progesterone micronized 100 mg capsule  TAKE 2 CAPSULES BY MOUTH AT BEDTIME    . TRINTELLIX 10 MG TABS tablet TAKE 1 TABLET BY MOUTH EVERY DAY 30 tablet 5  . TRINTELLIX 20 MG TABS tablet TAKE 1 TABLET BY MOUTH EVERY DAY 30 tablet 2  . valACYclovir (VALTREX) 500 MG tablet valacyclovir 500 mg tablet  TAKE 1 TABLET BY MOUTH EVERY DAY    . zinc gluconate 50 MG tablet Take 50 mg by mouth daily.    . cetirizine (ZYRTEC) 10 MG tablet Take 10 mg by mouth daily.    Marland Kitchen estradiol (VIVELLE-DOT) 0.075 MG/24HR Place 1 patch onto the skin 2 (two) times a week.     Marland Kitchen LORazepam (ATIVAN) 0.5 MG tablet Take 1 tablet (0.5 mg total) by mouth every 8 (eight) hours. 30 tablet 0   No facility-administered medications prior to visit.   Past Medical History:  Diagnosis Date  . Anemia   . Anxiety   . ASCUS on Pap smear   . Bacterial infection   . Bladder spasm   .  Blood in stool   . Cancer ([place])    Basal Cell Skin Cancer  . Candida vaginitis   . Cervical dysplasia   . Colon polyp    tubular adenoma  . Depression   . Dizziness 04/05/2016  . Dysmenorrhea   . Endometrial polyp   . Fatigue   . Genital warts   . Hemorrhoids   . Herpes   . History of chicken pox   . HPV (human papilloma virus) infection   . Low blood pressure   . Lyme disease   . Menorrhagia   . Orthostatic hypotension 04/05/2016  . Osteopenia   . Ovarian cyst   . PMDD (premenstrual dysphoric disorder)   . POTS (postural orthostatic tachycardia syndrome) 04/05/2016  . Rectal bleed   . Urinary frequency   . Urine incontinence   . Yeast infection    Past Surgical History:  Procedure Laterality Date  . COLONOSCOPY  12/29/2010    2 sessile polyps Dr. Fuller Plan  . DILATION AND CURETTAGE OF UTERUS    . HYSTEROSCOPY    . [place]     Social History   Socioeconomic History  . Marital status: Married    Spouse name: Jaci Standard   . Number of children: 3  . Years of education: college  . Highest education level: Not on file  Occupational History  . Not on file  Tobacco Use  . Smoking status: Never Smoker  . Smokeless tobacco: Never Used  Vaping Use  . Vaping Use: Never used  Substance and Sexual Activity  . Alcohol use: Yes    Alcohol/week: 0.0 standard drinks    Comment: 1-2 drinks/week  . Drug use: No  . Sexual activity: Yes    Partners: Male    Birth control/protection: None    Comment: vasectony  Other Topics Concern  . Not on file  Social History Narrative   Drinks 1 cup of coffee a day    Social Determinants of Health   Financial Resource Strain:   . Difficulty of Paying Living Expenses: Not on file  Food Insecurity:   . Worried About Charity fundraiser in the Last Year: Not on file  . Ran Out of Food in the Last Year: Not on file  Transportation Needs:   . Lack of Transportation (Medical): Not on file  . Lack of Transportation (Non-Medical): Not on file  Physical Activity:   . Days of Exercise per Week: Not on file  . Minutes of Exercise per Session: Not on file  Stress:   . Feeling of Stress : Not on file  Social Connections:   . Frequency of Communication with Friends and Family: Not on file  . Frequency of Social Gatherings with Friends and Family: Not on file  . Attends Religious Services: Not on file  . Active Member of Clubs or Organizations: Not on file  . Attends Archivist Meetings: Not on file  . Marital Status: Not on file   Family History  Problem Relation Age of Onset  . Prostate cancer Father   . Hypertension Father   . Renal cancer Father   . Migraines Mother   . Alcohol abuse Mother   . Suicidality Mother        deceased  . Colon cancer Paternal  Grandfather   . Dementia Paternal Grandmother   . Thyroid disease Maternal Grandmother   . Rheum arthritis Maternal Grandmother   . Diabetes Maternal Grandfather   . Anxiety disorder Sister  x 2      Review of Systems: Pertinent positive and negative review of systems were noted in the above HPI section. All other review of systems were otherwise negative.   Physical Exam: General: Well developed, well nourished, no acute distress Head: Normocephalic and atraumatic Eyes:  sclerae anicteric, EOMI Ears: Normal auditory acuity Mouth: Not examined, mask on during Covid-19 pandemic Neck: Supple, no masses or thyromegaly Lungs: Clear throughout to auscultation Heart: Regular rate and rhythm; no murmurs, rubs or bruits Abdomen: Soft, mild RUQ tenderness and non distended. No masses, hepatosplenomegaly or hernias noted. Normal Bowel sounds Rectal: Not done Musculoskeletal: Symmetrical with no gross deformities  Skin: No lesions on visible extremities Pulses:  Normal pulses noted Extremities: No clubbing, cyanosis, edema or deformities noted Neurological: Alert oriented x 4, grossly nonfocal Cervical Nodes:  No significant cervical adenopathy Inguinal Nodes: No significant inguinal adenopathy Psychological:  Alert and cooperative. Normal mood and affect   Assessment and Recommendations:  1.  RUQ pain, bloating, change in bowel habits, alternating diarrhea and with mild constipation.  Suspected IBS.  Rule out cholelithiasis.  Obtain CBC and lipase.  Schedule RUQ [place].  Begin glycopyrrolate 1 mg p.o. twice daily.  Avoid foods that exacerbate symptoms.  She is advised to call if her symptoms do not significantly improve.  If symptoms are not significantly improved consider increasing glycopyrrolate to 2 mg twice daily or trying another antispasmodic. REV in 6 weeks.   2.  Personal history of adenomatous colon polyps.  A 5-year interval surveillance colonoscopy is recommended in March  2023.  3.  Intermittent low and mid back pain. Follow up with PCP.    4.  Mild hyponatremia.  Advised her to contact her nephrologist.   cc: Zane Herald, MD [address] [place],  [place] [phone]

## 2020-08-25 ENCOUNTER — Ambulatory Visit (HOSPITAL_COMMUNITY)
Admission: RE | Admit: 2020-08-25 | Discharge: 2020-08-25 | Disposition: A | Discharge status: Home / Self Care | Payer: BC Managed Care – PPO | Source: Ambulatory Visit | Attending: Gastroenterology | Admitting: Gastroenterology

## 2020-08-25 ENCOUNTER — Other Ambulatory Visit: Payer: Self-pay

## 2020-08-25 DIAGNOSIS — R194 Change in bowel habit: Secondary | ICD-10-CM

## 2020-08-25 DIAGNOSIS — R1011 Right upper quadrant pain: Secondary | ICD-10-CM | POA: Insufficient documentation

## 2020-09-02 ENCOUNTER — Other Ambulatory Visit: Payer: Self-pay

## 2020-09-02 ENCOUNTER — Encounter: Payer: Self-pay | Admitting: Psychiatry

## 2020-09-02 ENCOUNTER — Ambulatory Visit (INDEPENDENT_AMBULATORY_CARE_PROVIDER_SITE_OTHER): Payer: BC Managed Care – PPO | Admitting: Psychiatry

## 2020-09-02 DIAGNOSIS — F411 Generalized anxiety disorder: Secondary | ICD-10-CM | POA: Diagnosis not present

## 2020-09-02 DIAGNOSIS — F900 Attention-deficit hyperactivity disorder, predominantly inattentive type: Secondary | ICD-10-CM | POA: Diagnosis not present

## 2020-09-02 DIAGNOSIS — Z79899 Other long term (current) drug therapy: Secondary | ICD-10-CM | POA: Diagnosis not present

## 2020-09-02 DIAGNOSIS — F339 Major depressive disorder, recurrent, unspecified: Secondary | ICD-10-CM

## 2020-09-02 DIAGNOSIS — F332 Major depressive disorder, recurrent severe without psychotic features: Secondary | ICD-10-CM

## 2020-09-02 MED ORDER — VORTIOXETINE HBR 20 MG PO TABS: 20.0000 mg | ORAL_TABLET | Freq: Every day | ORAL | 5 refills | Status: DC

## 2020-09-02 MED ORDER — LITHIUM CARBONATE 150 MG PO CAPS: 450.0000 mg | ORAL_CAPSULE | Freq: Every day | ORAL | 0 refills | Status: DC

## 2020-09-02 MED ORDER — VORTIOXETINE HBR 10 MG PO TABS: 10.0000 mg | ORAL_TABLET | Freq: Every day | ORAL | 5 refills | Status: DC

## 2020-09-02 NOTE — Progress Notes (Signed)
Autumn Foster [id number] 09/03/1966 54 y.o.     Subjective:   Patient ID:  Autumn Foster is a 54 y.o. (DOB 06/28/1966) female.  Chief Complaint:  Chief Complaint  Patient presents with  . Follow-up    Autumn Foster presents to the office today for follow-up of TR severe, major depression and anxiety.  on December 13, 2018.  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  At visit May Vyvanse 20 added Vyvanse augmentation for depression and attention and focus and productivity 20 mg daily. Got alittle more benefit for mood, energy, productivity. More normal.   Lost most off the mirtazapine weight.  AT visit January 24, 2019.  The patient wanted to wean off mirtazapine because of weight gain though she has had relapse when she is done this before.  At visit in August 09, 2019.  Lithium was reduced to 450 mg daily due to side effect complaints.   She remained on Trintellix 30 mg daily and remained off Vyvanse 20 mg daily.   Repeat lithium level at that dose was 0.3.  The prior 2 levels had been 0.4 and 0.3. TSH remains stable and reduced at 0.200 on levothyroxine and Cytomel 10 mg as an augmentation strategy.  Last visit December 2020.  The following was noted: She reduced the lithium again to 300 mg daily and urination issues.  Still hungry and active  But can't lose weight and it's frustrating.  Logs her food.   Mental health is really good considering. Not depressed nor anxious.   Able to go to work.  Stopped Vyvanse bc urinary urgency and ravenous when it wore off at the end of the day.  It is a little better off the Vyvanse 20.  Urinary problem not resolved off the Vyvanse.   Plan:No benefit with reduction of lithium in  urinary SE and weight gain,  Therefore, Increase back 450 mg daily.  February 13, 2020 appointment, the following is noted: She increased lithium to 450 mg for a couple of mos and didn't notice any change so reduced the lithium to  300 mg daily again.  Able to lose to 166# but wants to be at 162#.  Keto diet helped.  Gotten my self control back. No depression.  Always a little anxiety and still doing DBT.  Feels a little racy.  Sleep well.  Tired most of the time. No coffee bc it wires her.  Grateful to be better. Plan: No benefit with reduction of lithium in  urinary SE and weight gain,  Therefore, Increase back 450 mg daily. Disc risk relapse. She wants to stay at 300 mg lithium daily.  If she sees any increase in depressive symptoms she was strongly encouraged to increase it back to 450 mg daily. Continue Trintellix 30 mg daily  2020-09-02 appointment with the following noted: Great.  Some challenging life changes and working in therapy.  H new job in [place]. Youngest D graduating.  Will have to quit her job next summer and that's been good for her mental health.  Concerns about so many things happening at once.  Work 25 hours per week.  Needs to still come back to [place] to help out father. Overall depression better since summer 2020 and thinks better off the mirtazapine.  Sees integrative doctor  Also.   [employer] won't pay for [place].  Reviewed OCD scale with some catastrophisizing and intrusive thought and compulsive checking of kids.  Previous psych  med trials include Poor response to SSRI. sertraline, fluoxetine,  Lexapro, citalopram,duloxetine, mirazapine 60mg  helped anxiety but cause weight gain, Viibryd which was ineffective, Deplin, bupropion which increased her anxiety, Buspirone with celexa.   lithium 900 tremor. lamotrigine which caused a rash twice,  clonazepam which caused her to be sleepy and have a headache, Pristiq which caused her to "jump out of my skin",  Abilify with muscle twitches, buspirone uncontrollable crying,  Seroquel, pramipexole which cause muscle twitching, Latuda which was ineffective, Nuvigil, gabapentin which caused her to feel tired and loopy, hydroxyzine which cause dizziness,   trazodone joint pain,    Hosp for SI wtith plan to cut wrists, wrote a sui note and H stopped her on Aug 25 and hosp then until 8/29 and started PHP for 3 weeks. Helped some. Then [facility] did IV infusion #6 from9-17 to 9-26.  Resolved SI and depression much improved.  Did another ketamine infusion Sep 10, 2018 without much improvement.  Review of Systems:  Review of Systems  Constitutional: Negative for appetite change.  Gastrointestinal: Negative for abdominal distention, constipation and nausea.  Genitourinary: Positive for frequency and urgency.  Neurological: Negative for dizziness and tremors.  Psychiatric/Behavioral: Negative for agitation, behavioral problems, confusion, decreased concentration, hallucinations, sleep disturbance and suicidal ideas. The patient is not nervous/anxious and is not hyperactive.     Medications: I have reviewed the patient's current medications.  Current Outpatient Medications  Medication Sig Dispense Refill  . Cholecalciferol (VITAMIN D3) 1.25 MG (50000 UT) CAPS Take 1 capsule by mouth daily.    Marland Kitchen desmopressin (DDAVP) 0.1 MG tablet desmopressin 0.1 mg tablet  TAKE 1 TABLET BY MOUTH TWICE A DAY WITH 1 TABLET IN THE AFTERNOON AS NEEDED    . fexofenadine (ALLEGRA) 60 MG tablet Take 60 mg by mouth daily.    Marland Kitchen glycopyrrolate (ROBINUL) 1 MG tablet Take 1 tablet (1 mg total) by mouth 2 (two) times daily. 60 tablet 11  . levothyroxine (SYNTHROID) 88 MCG tablet 88 mcg.     . liothyronine (CYTOMEL) 5 MCG tablet Take 5 mcg by mouth 2 (two) times daily. Take 3 tabs in the morning and one in the afternoon    . Magnesium Sulfate 70 MG CAPS Take by mouth.    . Omega-3 Fatty Acids (FISH OIL PO) Take 1 capsule by mouth daily.    . progesterone (PROMETRIUM) 100 MG capsule progesterone micronized 100 mg capsule  TAKE 2 CAPSULES BY MOUTH AT BEDTIME    . valACYclovir (VALTREX) 500 MG tablet valacyclovir 500 mg tablet  TAKE 1 TABLET BY MOUTH EVERY DAY     . zinc gluconate 50 MG tablet Take 50 mg by mouth daily.    Marland Kitchen lithium carbonate 150 MG capsule Take 3 capsules (450 mg total) by mouth at bedtime. 270 capsule 0  . vortioxetine HBr (TRINTELLIX) 10 MG TABS tablet Take 1 tablet (10 mg total) by mouth daily. 30 tablet 5  . vortioxetine HBr (TRINTELLIX) 20 MG TABS tablet Take 1 tablet (20 mg total) by mouth daily. 30 tablet 5   No current facility-administered medications for this visit.    Medication Side Effects:   Dry mouth, worse constipation.  Allergies:  Allergies  Allergen Reactions  . Lexapro [Escitalopram Oxalate] Other (See Comments)    Insomnia   . Pristiq [Desvenlafaxine]     Dry heaves/dizzy  . Prozac [Fluoxetine Hcl]     HA and Strange Dreams  . Septra Ds [Sulfamethoxazole-Trimethoprim]     Jitteriness/hyper/affecting  sleep  . Penicillins Rash    Childhood rash    Past Medical History:  Diagnosis Date  . Anemia   . Anxiety   . ASCUS on Pap smear   . Bacterial infection   . Bladder spasm   . Blood in stool   . Cancer ([facility])    Basal Cell Skin Cancer  . Candida vaginitis   . Cervical dysplasia   . Colon polyp    tubular adenoma  . Depression   . Dizziness 04/05/2016  . Dysmenorrhea   . Endometrial polyp   . Fatigue   . Genital warts   . Hemorrhoids   . Herpes   . History of chicken pox   . HPV (human papilloma virus) infection   . Low blood pressure   . Lyme disease   . Menorrhagia   . Orthostatic hypotension 04/05/2016  . Osteopenia   . Ovarian cyst   . PMDD (premenstrual dysphoric disorder)   . POTS (postural orthostatic tachycardia syndrome) 04/05/2016  . Rectal bleed   . Urinary frequency   . Urine incontinence   . Yeast infection     Family History  Problem Relation Age of Onset  . Prostate cancer Father   . Hypertension Father   . Renal cancer Father   . Migraines Mother   . Alcohol abuse Mother   . Suicidality Mother        deceased  . Colon cancer Paternal Grandfather   .  Dementia Paternal Grandmother   . Thyroid disease Maternal Grandmother   . Rheum arthritis Maternal Grandmother   . Diabetes Maternal Grandfather   . Anxiety disorder Sister        x 2    Social History   Socioeconomic History  . Marital status: Married    Spouse name: Jaci Standard   . Number of children: 3  . Years of education: college  . Highest education level: Not on file  Occupational History  . Not on file  Tobacco Use  . Smoking status: Never Smoker  . Smokeless tobacco: Never Used  Vaping Use  . Vaping Use: Never used  Substance and Sexual Activity  . Alcohol use: Yes    Alcohol/week: 0.0 standard drinks    Comment: 1-2 drinks/week  . Drug use: No  . Sexual activity: Yes    Partners: Male    Birth control/protection: None    Comment: vasectony  Other Topics Concern  . Not on file  Social History Narrative   Drinks 1 cup of coffee a day    Social Determinants of Health   Financial Resource Strain:   . Difficulty of Paying Living Expenses: Not on file  Food Insecurity:   . Worried About Charity fundraiser in the Last Year: Not on file  . Ran Out of Food in the Last Year: Not on file  Transportation Needs:   . Lack of Transportation (Medical): Not on file  . Lack of Transportation (Non-Medical): Not on file  Physical Activity:   . Days of Exercise per Week: Not on file  . Minutes of Exercise per Session: Not on file  Stress:   . Feeling of Stress : Not on file  Social Connections:   . Frequency of Communication with Friends and Family: Not on file  . Frequency of Social Gatherings with Friends and Family: Not on file  . Attends Religious Services: Not on file  . Active Member of Clubs or Organizations: Not on file  . Attends  Club or Organization Meetings: Not on file  . Marital Status: Not on file  Intimate Partner Violence:   . Fear of Current or Ex-Partner: Not on file  . Emotionally Abused: Not on file  . Physically Abused: Not on file  . Sexually  Abused: Not on file   M hx multiple SA and died of Suicide at 48 yo.  Her aunt also. 2 of 3 kids on Vyvanse and done well. Past Medical History, Surgical history, Social history, and Family history were reviewed and updated as appropriate.   Please see review of systems for further details on the patient's review from today.   Objective:   Physical Exam:  There were no vitals taken for this visit.  Physical Exam Constitutional:      General: She is not in acute distress.    Appearance: Normal appearance. She is well-developed and normal weight.  Musculoskeletal:        General: No deformity.  Neurological:     Mental Status: She is alert and oriented to person, place, and time.     Cranial Nerves: No dysarthria.     Coordination: Coordination normal.  Psychiatric:        Attention and Perception: Attention and perception normal. She does not perceive auditory or visual hallucinations.        Mood and Affect: Mood is not anxious or depressed. Affect is not labile, blunt, angry or inappropriate.        Speech: Speech normal.        Behavior: Behavior normal. Behavior is cooperative.        Thought Content: Thought content normal. Thought content is not paranoid or delusional. Thought content does not include homicidal or suicidal ideation. Thought content does not include homicidal or suicidal plan.        Cognition and Memory: Cognition and memory normal.        Judgment: Judgment normal.     Comments: Overall doing very well with regard to mood.  Dramatically better than 2019.     Lab Review:     Component Value Date/Time   NA 137 10/31/2018 1422   K 4.4 10/31/2018 1422   CL 98 10/31/2018 1422   CO2 25 10/31/2018 1422   GLUCOSE 88 10/31/2018 1422   GLUCOSE 134 (H) 06/18/2018 1821   BUN 16 10/31/2018 1422   CREATININE 0.67 10/31/2018 1422   CALCIUM 9.9 10/31/2018 1422   PROT 7.1 06/18/2018 1821   PROT 7.6 03/31/2016 0932   ALBUMIN 4.6 06/18/2018 1821   ALBUMIN 5.1  03/31/2016 0932   AST 20 06/18/2018 1821   ALT 16 06/18/2018 1821   ALKPHOS 40 06/18/2018 1821   BILITOT 0.6 06/18/2018 1821   BILITOT 0.4 03/31/2016 0932   GFRNONAA 101 10/31/2018 1422   GFRAA 116 10/31/2018 1422       Component Value Date/Time   WBC 5.9 08/19/2020 1610   RBC 4.33 08/19/2020 1610   HGB 13.3 08/19/2020 1610   HCT 40.3 08/19/2020 1610   PLT 290.0 08/19/2020 1610   MCV 93.2 08/19/2020 1610   MCH 31.4 06/18/2018 1821   MCHC 33.0 08/19/2020 1610   RDW 12.9 08/19/2020 1610   LYMPHSABS 1.7 08/19/2020 1610   MONOABS 0.5 08/19/2020 1610   EOSABS 0.0 08/19/2020 1610   BASOSABS 0.0 08/19/2020 1610    Lithium Lvl  Date Value Ref Range Status  08/27/2019 0.3 (L) 0.6 - 1.2 mmol/L Final    Comment:  Detection Limit = 0.1                           <0.1 indicates None Detected    on 600mg /D  No results found for: PHENYTOIN, PHENOBARB, VALPROATE, CBMZ   .res Assessment: Plan:    Jarelly was seen today for follow-up.  Diagnoses and all orders for this visit:  Recurrent major depression resistant to treatment ([facility]) -     vortioxetine HBr (TRINTELLIX) 10 MG TABS tablet; Take 1 tablet (10 mg total) by mouth daily. -     vortioxetine HBr (TRINTELLIX) 20 MG TABS tablet; Take 1 tablet (20 mg total) by mouth daily.  Generalized anxiety disorder  Attention deficit hyperactivity disorder (ADHD), predominantly inattentive type  Lithium use  Severe episode of recurrent major depressive disorder, without psychotic features ([facility]) -     lithium carbonate 150 MG capsule; Take 3 capsules (450 mg total) by mouth at bedtime.     We discussed her severe treatment resistant major depression and anxiety.  We reviewed the long list of failed psychiatric medications in detail.  Many of which were more intolerances and did not receive adequate trials.   She is taken all the things with the least side effect risk.  Her symptoms have been very severe  and fortunately have now been controlled for many months.  Overall she is better than before the current meds.  She historically has doubted the benefit of lithium but in the opinion of the physician this low-dose lithium is likely helping significantly.  Disc value of routine in .  No benefit with reduction of lithium in  urinary SE and weight gain,  Disc risk relapse. She wants to stay at 300 mg lithium daily.  If she sees any increase in depressive symptoms she was strongly encouraged to increase it back to 450 mg daily.  Continue DBT  Counseled patient regarding potential benefits, risks, and side effects of lithium to include potential risk of lithium affecting thyroid and renal function.  Discussed need for periodic lab monitoring to determine drug level and to assess for potential adverse effects.  Counseled patient regarding signs and symptoms of lithium toxicity and advised that they notify office immediately or seek urgent medical attention if experiencing these signs and symptoms.  Patient advised to contact office with any questions or concerns. She is taking a very tiny dose of lithium. Will request labs from PCP.  Continue Trintellix to 30 mg (20mg  + 10mg ) daily above the usual max dosage for TRD.   As for refill of lorazepam 0.5 mg every 8 hours as needed anxiety.  She uses it rarely.  4-6 mos.  She agreed with the plan.  Lynder Parents, MD, DFAPA  Future Appointments  Date Time Provider [place]  10/07/2020  2:10 PM Ladene Artist, MD [facility]    No orders of the defined types were placed in this encounter.

## 2020-10-07 ENCOUNTER — Ambulatory Visit: Payer: BC Managed Care – PPO | Admitting: Gastroenterology

## 2020-11-01 ENCOUNTER — Other Ambulatory Visit: Payer: BC Managed Care – PPO

## 2021-03-03 ENCOUNTER — Encounter: Payer: Self-pay | Admitting: Psychiatry

## 2021-03-03 ENCOUNTER — Ambulatory Visit (INDEPENDENT_AMBULATORY_CARE_PROVIDER_SITE_OTHER): Payer: BC Managed Care – PPO | Admitting: Psychiatry

## 2021-03-03 ENCOUNTER — Other Ambulatory Visit: Payer: Self-pay

## 2021-03-03 DIAGNOSIS — F411 Generalized anxiety disorder: Secondary | ICD-10-CM

## 2021-03-03 DIAGNOSIS — Z79899 Other long term (current) drug therapy: Secondary | ICD-10-CM

## 2021-03-03 DIAGNOSIS — G251 Drug-induced tremor: Secondary | ICD-10-CM

## 2021-03-03 DIAGNOSIS — F339 Major depressive disorder, recurrent, unspecified: Secondary | ICD-10-CM | POA: Diagnosis not present

## 2021-03-03 DIAGNOSIS — F900 Attention-deficit hyperactivity disorder, predominantly inattentive type: Secondary | ICD-10-CM

## 2021-03-03 NOTE — Progress Notes (Signed)
Autumn Foster [id number] 1966/03/03 55 y.o.     Subjective:   Patient ID:  Autumn Foster is a 55 y.o. (DOB [birth year]) female.  Chief Complaint:  Chief Complaint  Patient presents with  . Follow-up  . Recurrent major depression resistant to treatment [facility])    Autumn Foster presents to the office today for follow-up of TR severe, major depression and anxiety.  on December 13, 2018.  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  At visit May Vyvanse 20 added Vyvanse augmentation for depression and attention and focus and productivity 20 mg daily. Got alittle more benefit for mood, energy, productivity. More normal.   Lost most off the mirtazapine weight.  AT visit January 24, 2019.  The patient wanted to wean off mirtazapine because of weight gain though she has had relapse when she is done this before.  At visit in August 09, 2019.  Lithium was reduced to 450 mg daily due to side effect complaints.   She remained on Trintellix 30 mg daily and remained off Vyvanse 20 mg daily.   Repeat lithium level at that dose was 0.3.  The prior 2 levels had been 0.4 and 0.3. TSH remains stable and reduced at 0.200 on levothyroxine and Cytomel 10 mg as an augmentation strategy.  Last visit December 2020.  The following was noted: She reduced the lithium again to 300 mg daily and urination issues.  Still hungry and active  But can't lose weight and it's frustrating.  Logs her food.   Mental health is really good considering. Not depressed nor anxious.   Able to go to work.  Stopped Vyvanse bc urinary urgency and ravenous when it wore off at the end of the day.  It is a little better off the Vyvanse 20.  Urinary problem not resolved off the Vyvanse.   Plan:No benefit with reduction of lithium in  urinary SE and weight gain,  Therefore, Increase back 450 mg daily.  February 13, 2020 appointment, the following is noted: She increased lithium to 450 mg for a couple of  mos and didn't notice any change so reduced the lithium to 300 mg daily again.  Able to lose to 166# but wants to be at 162#.  Keto diet helped.  Gotten my self control back. No depression.  Always a little anxiety and still doing DBT.  Feels a little racy.  Sleep well.  Tired most of the time. No coffee bc it wires her.  Grateful to be better. Plan: No benefit with reduction of lithium in  urinary SE and weight gain,  Therefore, Increase back 450 mg daily. Disc risk relapse. She wants to stay at 300 mg lithium daily.  If she sees any increase in depressive symptoms she was strongly encouraged to increase it back to 450 mg daily. Continue Trintellix 30 mg daily  2020-09-02 appointment with the following noted: Great.  Some challenging life changes and working in therapy.  H new job in [place]. Youngest D graduating.  Will have to quit her job next summer and that's been good for her mental health.  Concerns about so many things happening at once.  Work 25 hours per week.  Needs to still come back to [place] to help out father. Overall depression better since summer 2020 and thinks better off the mirtazapine. Plan no med changes: Continue Trintellix 30 mg daily  03/03/2021 appointment with the following noted: Stopped Trintellix 10 and now down  to 20 mg daily for over a month and is OK so far.  Doing really well.  Major life changes ongoing.  Father to asst living and sold his house.  He's not great.  In process of moving to [place].  Had to quit her job and worries bc it was good for me.  Plans to look for PT job in fall.  I do better with schedule. Patient reports stable mood and denies depressed or irritable moods.  Patient denies any recent difficulty with anxiety.  Patient denies difficulty with sleep initiation or maintenance. Denies appetite disturbance.  Patient reports that energy and motivation have been good.  Patient denies any difficulty with concentration.  Patient denies any suicidal  ideation. Doesn't want to go back to depression bc it was so bad.   Sees integrative doctor  Also.   [employer] won't pay for [place].  Reviewed OCD scale with some catastrophisizing and intrusive thought and compulsive checking of kids.  Previous psych med trials include Poor response to SSRI. sertraline, fluoxetine,  Lexapro, citalopram,duloxetine, mirazapine 60mg  helped anxiety but cause weight gain, Viibryd which was ineffective, Deplin, bupropion which increased her anxiety, Buspirone with celexa.   Trintellix 30 good response lithium 900 tremor. lamotrigine which caused a rash twice,  clonazepam which caused her to be sleepy and have a headache, Pristiq which caused her to "jump out of my skin",  Abilify with muscle twitches, buspirone uncontrollable crying,  Seroquel, pramipexole which cause muscle twitching, Latuda which was ineffective, Nuvigil,  gabapentin which caused her to feel tired and loopy, hydroxyzine which cause dizziness,  trazodone joint pain,    Hosp for SI wtith plan to cut wrists, wrote a sui note and H stopped her on Aug 25 and hosp then until 8/29 and started PHP for 3 weeks. Helped some. Then [facility] did IV infusion #6 from9-17 to 9-26.  Resolved SI and depression much improved.  Did another ketamine infusion Sep 10, 2018 without much improvement.  Review of Systems:  Review of Systems  Constitutional: Negative for appetite change.  Cardiovascular: Negative for palpitations.  Gastrointestinal: Negative for abdominal distention, constipation and nausea.  Genitourinary: Positive for frequency and urgency.  Neurological: Negative for dizziness and tremors.  Psychiatric/Behavioral: Negative for agitation, behavioral problems, confusion, decreased concentration, hallucinations, sleep disturbance and suicidal ideas. The patient is not nervous/anxious and is not hyperactive.     Medications: I have reviewed the patient's current  medications.  Current Outpatient Medications  Medication Sig Dispense Refill  . Cholecalciferol (VITAMIN D3) 1.25 MG (50000 UT) CAPS Take 1 capsule by mouth daily.    Marland Kitchen desmopressin (DDAVP) 0.1 MG tablet desmopressin 0.1 mg tablet  TAKE 1 TABLET BY MOUTH TWICE A DAY WITH 1 TABLET IN THE AFTERNOON AS NEEDED    . levothyroxine (SYNTHROID) 88 MCG tablet 88 mcg.     . liothyronine (CYTOMEL) 5 MCG tablet Take 5 mcg by mouth daily. Take 3 tabs in the morning and one in the afternoon    . lithium carbonate 150 MG capsule Take 3 capsules (450 mg total) by mouth at bedtime. 270 capsule 0  . Omega-3 Fatty Acids (FISH OIL PO) Take 1 capsule by mouth daily.    . progesterone (PROMETRIUM) 100 MG capsule progesterone micronized 100 mg capsule  TAKE 2 CAPSULES BY MOUTH AT BEDTIME    . valACYclovir (VALTREX) 500 MG tablet valacyclovir 500 mg tablet  TAKE 1 TABLET BY MOUTH EVERY DAY    . vortioxetine HBr (TRINTELLIX)  20 MG TABS tablet Take 1 tablet (20 mg total) by mouth daily. 30 tablet 5  . zinc gluconate 50 MG tablet Take 50 mg by mouth daily.    . fexofenadine (ALLEGRA) 60 MG tablet Take 60 mg by mouth daily. (Patient not taking: Reported on 03/03/2021)    . glycopyrrolate (ROBINUL) 1 MG tablet Take 1 tablet (1 mg total) by mouth 2 (two) times daily. (Patient not taking: Reported on 03/03/2021) 60 tablet 11  . Magnesium Sulfate 70 MG CAPS Take by mouth. (Patient not taking: Reported on 03/03/2021)    . vortioxetine HBr (TRINTELLIX) 10 MG TABS tablet Take 1 tablet (10 mg total) by mouth daily. (Patient not taking: Reported on 03/03/2021) 30 tablet 5   No current facility-administered medications for this visit.    Medication Side Effects:   Dry mouth, worse constipation.  Allergies:  Allergies  Allergen Reactions  . Lexapro [Escitalopram Oxalate] Other (See Comments)    Insomnia   . Pristiq [Desvenlafaxine]     Dry heaves/dizzy  . Prozac [Fluoxetine Hcl]     HA and Strange Dreams  . Septra Ds  [Sulfamethoxazole-Trimethoprim]     Jitteriness/hyper/affecting sleep  . Penicillins Rash    Childhood rash    Past Medical History:  Diagnosis Date  . Anemia   . Anxiety   . ASCUS on Pap smear   . Bacterial infection   . Bladder spasm   . Blood in stool   . Cancer ([place])    Basal Cell Skin Cancer  . Candida vaginitis   . Cervical dysplasia   . Colon polyp    tubular adenoma  . Depression   . Dizziness 04/05/2016  . Dysmenorrhea   . Endometrial polyp   . Fatigue   . Genital warts   . Hemorrhoids   . Herpes   . History of chicken pox   . HPV (human papilloma virus) infection   . Low blood pressure   . Lyme disease   . Menorrhagia   . Orthostatic hypotension 04/05/2016  . Osteopenia   . Ovarian cyst   . PMDD (premenstrual dysphoric disorder)   . POTS (postural orthostatic tachycardia syndrome) 04/05/2016  . Rectal bleed   . Urinary frequency   . Urine incontinence   . Yeast infection     Family History  Problem Relation Age of Onset  . Prostate cancer Father   . Hypertension Father   . Renal cancer Father   . Migraines Mother   . Alcohol abuse Mother   . Suicidality Mother        deceased  . Colon cancer Paternal Grandfather   . Dementia Paternal Grandmother   . Thyroid disease Maternal Grandmother   . Rheum arthritis Maternal Grandmother   . Diabetes Maternal Grandfather   . Anxiety disorder Sister        x 2    Social History   Socioeconomic History  . Marital status: Married    Spouse name: Jaci Standard   . Number of children: 3  . Years of education: college  . Highest education level: Not on file  Occupational History  . Not on file  Tobacco Use  . Smoking status: Never Smoker  . Smokeless tobacco: Never Used  Vaping Use  . Vaping Use: Never used  Substance and Sexual Activity  . Alcohol use: Yes    Alcohol/week: 0.0 standard drinks    Comment: 1-2 drinks/week  . Drug use: No  . Sexual activity: Yes  Partners: Male    Birth  control/protection: None    Comment: vasectony  Other Topics Concern  . Not on file  Social History Narrative   Drinks 1 cup of coffee a day    Social Determinants of Health   Financial Resource Strain: Not on file  Food Insecurity: Not on file  Transportation Needs: Not on file  Physical Activity: Not on file  Stress: Not on file  Social Connections: Not on file  Intimate Partner Violence: Not on file   M hx multiple SA and died of Suicide at 55 yo.  Her aunt also. 2 of 3 kids on Vyvanse and done well. Past Medical History, Surgical history, Social history, and Family history were reviewed and updated as appropriate.   Please see review of systems for further details on the patient's review from today.   Objective:   Physical Exam:  There were no vitals taken for this visit.  Physical Exam Constitutional:      General: She is not in acute distress.    Appearance: Normal appearance. She is well-developed and normal weight.  Musculoskeletal:        General: No deformity.  Neurological:     Mental Status: She is alert and oriented to person, place, and time.     Cranial Nerves: No dysarthria.     Coordination: Coordination normal.  Psychiatric:        Attention and Perception: Attention and perception normal. She does not perceive auditory or visual hallucinations.        Mood and Affect: Mood is not anxious or depressed. Affect is not labile, blunt, angry or inappropriate.        Speech: Speech normal. Speech is not slurred.        Behavior: Behavior normal. Behavior is cooperative.        Thought Content: Thought content normal. Thought content is not paranoid or delusional. Thought content does not include homicidal or suicidal ideation. Thought content does not include homicidal or suicidal plan.        Cognition and Memory: Cognition and memory normal.        Judgment: Judgment normal.     Comments: Overall doing very well with regard to mood.  Dramatically better  than 2019.     Lab Review:     Component Value Date/Time   NA 137 10/31/2018 1422   K 4.4 10/31/2018 1422   CL 98 10/31/2018 1422   CO2 25 10/31/2018 1422   GLUCOSE 88 10/31/2018 1422   GLUCOSE 134 (H) 06/18/2018 1821   BUN 16 10/31/2018 1422   CREATININE 0.67 10/31/2018 1422   CALCIUM 9.9 10/31/2018 1422   PROT 7.1 06/18/2018 1821   PROT 7.6 03/31/2016 0932   ALBUMIN 4.6 06/18/2018 1821   ALBUMIN 5.1 03/31/2016 0932   AST 20 06/18/2018 1821   ALT 16 06/18/2018 1821   ALKPHOS 40 06/18/2018 1821   BILITOT 0.6 06/18/2018 1821   BILITOT 0.4 03/31/2016 0932   GFRNONAA 101 10/31/2018 1422   GFRAA 116 10/31/2018 1422       Component Value Date/Time   WBC 5.9 08/19/2020 1610   RBC 4.33 08/19/2020 1610   HGB 13.3 08/19/2020 1610   HCT 40.3 08/19/2020 1610   PLT 290.0 08/19/2020 1610   MCV 93.2 08/19/2020 1610   MCH 31.4 06/18/2018 1821   MCHC 33.0 08/19/2020 1610   RDW 12.9 08/19/2020 1610   LYMPHSABS 1.7 08/19/2020 1610   MONOABS 0.5 08/19/2020 1610  EOSABS 0.0 08/19/2020 1610   BASOSABS 0.0 08/19/2020 1610    Lithium Lvl  Date Value Ref Range Status  08/27/2019 0.3 (L) 0.6 - 1.2 mmol/L Final    Comment:                                     Detection Limit = 0.1                           <0.1 indicates None Detected    on 600mg /D  No results found for: PHENYTOIN, PHENOBARB, VALPROATE, CBMZ   .res Assessment: Plan:    Autumn Foster was seen today for follow-up and recurrent major depression resistant to treatment ([facility]).  Diagnoses and all orders for this visit:  Recurrent major depression resistant to treatment ([place])  Generalized anxiety disorder  Attention deficit hyperactivity disorder (ADHD), predominantly inattentive type  Lithium use  Lithium-induced tremor     We discussed her severe treatment resistant major depression and anxiety.  We reviewed the long list of failed psychiatric medications in detail.  Many of which were more intolerances and did not  receive adequate trials.   She is taken all the things with the least side effect risk.  Her symptoms have been very severe and fortunately have now been controlled for many months.  Overall she is better than before the current meds.  She historically has doubted the benefit of lithium but in the opinion of the physician this low-dose lithium is likely helping significantly.  Disc value of routine in .  No benefit with reduction of lithium in  urinary SE and weight gain,  Disc risk relapse. She wants to stay at 300 mg lithium daily.  If she sees any increase in depressive symptoms she was strongly encouraged to increase it back to 450 mg daily.  Continue DBT  Counseled patient regarding potential benefits, risks, and side effects of lithium to include potential risk of lithium affecting thyroid and renal function.  Discussed need for periodic lab monitoring to determine drug level and to assess for potential adverse effects.  Counseled patient regarding signs and symptoms of lithium toxicity and advised that they notify office immediately or seek urgent medical attention if experiencing these signs and symptoms.  Patient advised to contact office with any questions or concerns. She is taking a very tiny dose of lithium. Will request labs from PCP.  Continue Trintellix 20mg  daily. Call if depression recurs at the lower dose.  As for refill of lorazepam 0.5 mg every 8 hours as needed anxiety.  She uses it rarely.  4-6 mos.  She agreed with the plan.  Lynder Parents, MD, DFAPA  No future appointments.  No orders of the defined types were placed in this encounter.

## 2021-03-25 ENCOUNTER — Other Ambulatory Visit: Payer: Self-pay | Admitting: Psychiatry

## 2021-03-25 DIAGNOSIS — F332 Major depressive disorder, recurrent severe without psychotic features: Secondary | ICD-10-CM

## 2021-05-31 ENCOUNTER — Other Ambulatory Visit: Payer: Self-pay | Admitting: Psychiatry

## 2021-05-31 DIAGNOSIS — F339 Major depressive disorder, recurrent, unspecified: Secondary | ICD-10-CM

## 2021-08-02 ENCOUNTER — Other Ambulatory Visit: Payer: Self-pay | Admitting: Psychiatry

## 2021-08-02 DIAGNOSIS — F332 Major depressive disorder, recurrent severe without psychotic features: Secondary | ICD-10-CM

## 2021-09-03 ENCOUNTER — Encounter: Payer: Self-pay | Admitting: Psychiatry

## 2021-09-03 ENCOUNTER — Ambulatory Visit (INDEPENDENT_AMBULATORY_CARE_PROVIDER_SITE_OTHER): Payer: BC Managed Care – PPO | Admitting: Psychiatry

## 2021-09-03 DIAGNOSIS — F332 Major depressive disorder, recurrent severe without psychotic features: Secondary | ICD-10-CM

## 2021-09-03 DIAGNOSIS — F411 Generalized anxiety disorder: Secondary | ICD-10-CM

## 2021-09-03 DIAGNOSIS — F339 Major depressive disorder, recurrent, unspecified: Secondary | ICD-10-CM | POA: Diagnosis not present

## 2021-09-03 DIAGNOSIS — Z79899 Other long term (current) drug therapy: Secondary | ICD-10-CM

## 2021-09-03 MED ORDER — LITHIUM CARBONATE 150 MG PO CAPS: 450.0000 mg | ORAL_CAPSULE | Freq: Every day | ORAL | 1 refills | Status: DC

## 2021-09-03 MED ORDER — VORTIOXETINE HBR 20 MG PO TABS: 20.0000 mg | ORAL_TABLET | Freq: Every day | ORAL | 1 refills | Status: DC

## 2021-09-03 NOTE — Progress Notes (Signed)
Autumn Foster [id number] 24-Jul-1966 55 y.o.   Virtual Visit via Telephone Note  I connected with pt by telephone and verified that I am speaking with the correct person using two identifiers.   I discussed the limitations, risks, security and privacy concerns of performing an evaluation and management service by telephone and the availability of in person appointments. I also discussed with the patient that there may be a patient responsible charge related to this service. The patient expressed understanding and agreed to proceed.  I discussed the assessment and treatment plan with the patient. The patient was provided an opportunity to ask questions and all were answered. The patient agreed with the plan and demonstrated an understanding of the instructions.   The patient was advised to call back or seek an in-person evaluation if the symptoms worsen or if the condition fails to improve as anticipated.  I provided 30 minutes of non-face-to-face time during this encounter. The call started at 1130 and ended at 12:00. The patient was located at home and the provider was located office.   Subjective:   Patient ID:  Autumn Foster is a 55 y.o. (DOB December 13, 1965) female.  Chief Complaint:  Chief Complaint  Patient presents with   Follow-up   Recurrent major depression resistant to treatment    Autumn Foster presents to the office today for follow-up of TR severe, major depression and anxiety.  on December 13, 2018.  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  At visit May Vyvanse 20 added Vyvanse augmentation for depression and attention and focus and productivity 20 mg daily. Got alittle more benefit for mood, energy, productivity. More normal.   Lost most off the mirtazapine weight.  AT visit January 24, 2019.  The patient wanted to wean off mirtazapine because of weight gain though she has had relapse when she is done this before.  At visit in  August 09, 2019.  Lithium was reduced to 450 mg daily due to side effect complaints.   She remained on Trintellix 30 mg daily and remained off Vyvanse 20 mg daily.   Repeat lithium level at that dose was 0.3.  The prior 2 levels had been 0.4 and 0.3. TSH remains stable and reduced at 0.200 on levothyroxine and Cytomel 10 mg as an augmentation strategy.  Last visit December 2020.  The following was noted: She reduced the lithium again to 300 mg daily and urination issues.  Still hungry and active  But can't lose weight and it's frustrating.  Logs her food.   Mental health is really good considering. Not depressed nor anxious.   Able to go to work.  Stopped Vyvanse bc urinary urgency and ravenous when it wore off at the end of the day.  It is a little better off the Vyvanse 20.  Urinary problem not resolved off the Vyvanse.   Plan:No benefit with reduction of lithium in  urinary SE and weight gain,  Therefore, Increase back 450 mg daily.  February 13, 2020 appointment, the following is noted: She increased lithium to 450 mg for a couple of mos and didn't notice any change so reduced the lithium to 300 mg daily again.  Able to lose to 166# but wants to be at 162#.  Keto diet helped.  Gotten my self control back. No depression.  Always a little anxiety and still doing DBT.  Feels a little racy.  Sleep well.  Tired most of the time. No  coffee bc it wires her.  Grateful to be better. Plan: No benefit with reduction of lithium in  urinary SE and weight gain,  Therefore, Increase back 450 mg daily. Disc risk relapse. She wants to stay at 300 mg lithium daily.  If she sees any increase in depressive symptoms she was strongly encouraged to increase it back to 450 mg daily. Continue Trintellix 30 mg daily  2020-09-02 appointment with the following noted: Great.  Some challenging life changes and working in therapy.  H new job in [place]. Youngest D graduating.  Will have to quit her job next summer and  that's been good for her mental health.  Concerns about so many things happening at once.  Work 25 hours per week.  Needs to still come back to [place] to help out father. Overall depression better since summer 2020 and thinks better off the mirtazapine. Plan no med changes: Continue Trintellix 30 mg daily  03/03/2021 appointment with the following noted: Stopped Trintellix 10 and now down to 20 mg daily for over a month and is OK so far.  Doing really well.  Major life changes ongoing.  Father to asst living and sold his house.  He's not great.  In process of moving to [place].  Had to quit her job and worries bc it was good for me.  Plans to look for PT job in fall.  I do better with schedule. Patient reports stable mood and denies depressed or irritable moods.  Patient denies any recent difficulty with anxiety.  Patient denies difficulty with sleep initiation or maintenance. Denies appetite disturbance.  Patient reports that energy and motivation have been good.  Patient denies any difficulty with concentration.  Patient denies any suicidal ideation. Doesn't want to go back to depression bc it was so bad. Plan: Continue Trintellix 20 mg daily with lithium 300 mg daily  09/03/2021 appointment with the following noted: Doing great.  Stressful year moving to [place] and sold dad's house.  Handled it well. Continue Trintellix 20 mg daily with lithium 300 mg daily.  No SE Less super emotional than she used to be.  Evened out.  Good enjoyment and interests.  Enjoys people and [employer] football games.   Walking and weight lifting. F depressed over declining health.  Asked about a therapist for him. Patient reports stable mood and denies depressed or irritable moods.  Patient denies any recent difficulty with anxiety.  Patient denies difficulty with sleep initiation or maintenance. Denies appetite disturbance.  Patient reports that energy and motivation have been good.  Patient denies any difficulty with  concentration.  Patient denies any suicidal ideation. Extremely rare lorazepam.   Sees integrative doctor  Also.   [employer] won't pay for [place].  Reviewed OCD scale with some catastrophisizing and intrusive thought and compulsive checking of kids.  Previous psych med trials include Poor response to SSRI. sertraline, fluoxetine,  Lexapro, citalopram,duloxetine, mirazapine 60mg  helped anxiety but cause weight gain, Viibryd which was ineffective, Deplin, bupropion which increased her anxiety, Buspirone with celexa.   Trintellix 30 good response lithium 900 tremor. lamotrigine which caused a rash twice,  clonazepam which caused her to be sleepy and have a headache, Pristiq which caused her to "jump out of my skin",  Abilify with muscle twitches, buspirone uncontrollable crying,  Seroquel, pramipexole which cause muscle twitching, Latuda which was ineffective, Nuvigil,  gabapentin which caused her to feel tired and loopy, hydroxyzine which cause dizziness,  trazodone joint pain,    Hosp for  SI wtith plan to cut wrists, wrote a sui note and H stopped her on Aug 25 and hosp then until 8/29 and started PHP for 3 weeks. Helped some. Then [place] did IV infusion #6 from9-17 to 9-26.  Resolved SI and depression much improved.  Did another ketamine infusion Sep 10, 2018 without much improvement.  Review of Systems:  Review of Systems  Constitutional:  Negative for appetite change.  Cardiovascular:  Negative for palpitations.  Gastrointestinal:  Negative for abdominal distention, constipation and nausea.  Genitourinary:  Negative for frequency and urgency.  Neurological:  Negative for dizziness and tremors.  Psychiatric/Behavioral:  Negative for agitation, behavioral problems, confusion, decreased concentration, hallucinations, sleep disturbance and suicidal ideas. The patient is not nervous/anxious and is not hyperactive.    Medications: I have reviewed the patient's current  medications.  Current Outpatient Medications  Medication Sig Dispense Refill   Cholecalciferol (VITAMIN D3) 1.25 MG (50000 UT) CAPS Take 1 capsule by mouth daily.     desmopressin (DDAVP) 0.1 MG tablet desmopressin 0.1 mg tablet  TAKE 1 TABLET BY MOUTH TWICE A DAY WITH 1 TABLET IN THE AFTERNOON AS NEEDED     levocetirizine (XYZAL) 5 MG tablet 5 mg every evening.     levothyroxine (SYNTHROID) 88 MCG tablet 88 mcg.      liothyronine (CYTOMEL) 5 MCG tablet Take 5 mcg by mouth daily. Take 3 tabs in the morning and one in the afternoon     Magnesium Sulfate 70 MG CAPS Take by mouth.     Omega-3 Fatty Acids (FISH OIL PO) Take 1 g by mouth daily.     progesterone (PROMETRIUM) 100 MG capsule progesterone micronized 100 mg capsule  TAKE 2 CAPSULES BY MOUTH AT BEDTIME     valACYclovir (VALTREX) 500 MG tablet valacyclovir 500 mg tablet  TAKE 1 TABLET BY MOUTH EVERY DAY     zinc gluconate 50 MG tablet Take 50 mg by mouth daily.     lithium carbonate 150 MG capsule Take 3 capsules (450 mg total) by mouth at bedtime. 270 capsule 1   vortioxetine HBr (TRINTELLIX) 20 MG TABS tablet Take 1 tablet (20 mg total) by mouth daily. 90 tablet 1   No current facility-administered medications for this visit.    Medication Side Effects:   Dry mouth, worse constipation.  Allergies:  Allergies  Allergen Reactions   Lexapro [Escitalopram Oxalate] Other (See Comments)    Insomnia    Pristiq [Desvenlafaxine]     Dry heaves/dizzy   Prozac [Fluoxetine Hcl]     HA and Strange Dreams   Septra Ds [Sulfamethoxazole-Trimethoprim]     Jitteriness/hyper/affecting sleep   Penicillins Rash    Childhood rash    Past Medical History:  Diagnosis Date   Anemia    Anxiety    ASCUS on Pap smear    Bacterial infection    Bladder spasm    Blood in stool    Cancer (HCC)    Basal Cell Skin Cancer   Candida vaginitis    Cervical dysplasia    Colon polyp    tubular adenoma   Depression    Dizziness 04/05/2016    Dysmenorrhea    Endometrial polyp    Fatigue    Genital warts    Hemorrhoids    Herpes    History of chicken pox    HPV (human papilloma virus) infection    Low blood pressure    Lyme disease    Menorrhagia  Orthostatic hypotension 04/05/2016   Osteopenia    Ovarian cyst    PMDD (premenstrual dysphoric disorder)    POTS (postural orthostatic tachycardia syndrome) 04/05/2016   Rectal bleed    Urinary frequency    Urine incontinence    Yeast infection     Family History  Problem Relation Age of Onset   Prostate cancer Father    Hypertension Father    Renal cancer Father    Migraines Mother    Alcohol abuse Mother    Suicidality Mother        deceased   Colon cancer Paternal Grandfather    Dementia Paternal Grandmother    Thyroid disease Maternal Grandmother    Rheum arthritis Maternal Grandmother    Diabetes Maternal Grandfather    Anxiety disorder Sister        x 2    Social History   Socioeconomic History   Marital status: Married    Spouse name: Jaci Standard    Number of children: 3   Years of education: college   Highest education level: Not on file  Occupational History   Not on file  Tobacco Use   Smoking status: Never   Smokeless tobacco: Never  Vaping Use   Vaping Use: Never used  Substance and Sexual Activity   Alcohol use: Yes    Alcohol/week: 0.0 standard drinks    Comment: 1-2 drinks/week   Drug use: No   Sexual activity: Yes    Partners: Male    Birth control/protection: None    Comment: vasectony  Other Topics Concern   Not on file  Social History Narrative   Drinks 1 cup of coffee a day    Social Determinants of Radio broadcast assistant Strain: Not on file  Food Insecurity: Not on file  Transportation Needs: Not on file  Physical Activity: Not on file  Stress: Not on file  Social Connections: Not on file  Intimate Partner Violence: Not on file   M hx multiple SA and died of Suicide at 79 yo.  Her aunt also. 2 of 3 kids on  Vyvanse and done well. Past Medical History, Surgical history, Social history, and Family history were reviewed and updated as appropriate.   Please see review of systems for further details on the patient's review from today.   Objective:   Physical Exam:  There were no vitals taken for this visit.  Physical Exam Constitutional:      General: She is not in acute distress.    Appearance: Normal appearance. She is well-developed and normal weight.  Musculoskeletal:        General: No deformity.  Neurological:     Mental Status: She is alert and oriented to person, place, and time.     Cranial Nerves: No dysarthria.     Coordination: Coordination normal.  Psychiatric:        Attention and Perception: Attention and perception normal. She does not perceive auditory or visual hallucinations.        Mood and Affect: Mood is not anxious or depressed. Affect is not labile, blunt, angry or inappropriate.        Speech: Speech normal. Speech is not slurred.        Behavior: Behavior normal. Behavior is cooperative.        Thought Content: Thought content normal. Thought content is not paranoid or delusional. Thought content does not include homicidal or suicidal ideation.        Cognition and Memory: Cognition  and memory normal.        Judgment: Judgment normal.     Comments: Overall doing very well with regard to mood.  Dramatically better than 2019.    Lab Review:     Component Value Date/Time   NA 137 10/31/2018 1422   K 4.4 10/31/2018 1422   CL 98 10/31/2018 1422   CO2 25 10/31/2018 1422   GLUCOSE 88 10/31/2018 1422   GLUCOSE 134 (H) 06/18/2018 1821   BUN 16 10/31/2018 1422   CREATININE 0.67 10/31/2018 1422   CALCIUM 9.9 10/31/2018 1422   PROT 7.1 06/18/2018 1821   PROT 7.6 03/31/2016 0932   ALBUMIN 4.6 06/18/2018 1821   ALBUMIN 5.1 03/31/2016 0932   AST 20 06/18/2018 1821   ALT 16 06/18/2018 1821   ALKPHOS 40 06/18/2018 1821   BILITOT 0.6 06/18/2018 1821   BILITOT 0.4  03/31/2016 0932   GFRNONAA 101 10/31/2018 1422   GFRAA 116 10/31/2018 1422       Component Value Date/Time   WBC 5.9 08/19/2020 1610   RBC 4.33 08/19/2020 1610   HGB 13.3 08/19/2020 1610   HCT 40.3 08/19/2020 1610   PLT 290.0 08/19/2020 1610   MCV 93.2 08/19/2020 1610   MCH 31.4 06/18/2018 1821   MCHC 33.0 08/19/2020 1610   RDW 12.9 08/19/2020 1610   LYMPHSABS 1.7 08/19/2020 1610   MONOABS 0.5 08/19/2020 1610   EOSABS 0.0 08/19/2020 1610   BASOSABS 0.0 08/19/2020 1610    Lithium Lvl  Date Value Ref Range Status  08/27/2019 0.3 (L) 0.6 - 1.2 mmol/L Final    Comment:                                     Detection Limit = 0.1                           <0.1 indicates None Detected    on 600mg /D  No results found for: PHENYTOIN, PHENOBARB, VALPROATE, CBMZ   .res Assessment: Plan:    Autumn Foster was seen today for follow-up and recurrent major depression resistant to treatment.  Diagnoses and all orders for this visit:  Recurrent major depression resistant to treatment ([facility]) -     vortioxetine HBr (TRINTELLIX) 20 MG TABS tablet; Take 1 tablet (20 mg total) by mouth daily.  Generalized anxiety disorder  Lithium use  Severe episode of recurrent major depressive disorder, without psychotic features (HCC) -     lithium carbonate 150 MG capsule; Take 3 capsules (450 mg total) by mouth at bedtime.    We discussed her history of severe treatment resistant major depression and anxiety.  We reviewed the long list of failed psychiatric medications in detail.  Many of which were more intolerances and did not receive adequate trials.   She is taken all the things with the least side effect risk.  Her symptoms have been very severe and fortunately have now been controlled for many months.  Overall she is better than before the current meds.  She historically has doubted the benefit of lithium but in the opinion of the physician this low-dose lithium is likely helping significantly.  Disc  value of routine in preventing depressive relapse..  Disc risk relapse. She wants to stay at 300 mg lithium daily.    Completed DBT  Counseled patient regarding potential benefits, risks, and side effects of lithium  to include potential risk of lithium affecting thyroid and renal function.  Discussed need for periodic lab monitoring to determine drug level and to assess for potential adverse effects.  Counseled patient regarding signs and symptoms of lithium toxicity and advised that they notify office immediately or seek urgent medical attention if experiencing these signs and symptoms.  Patient advised to contact office with any questions or concerns. She is taking a very tiny dose of lithium. Discussed the need to monitor kidney function, thyroid function (she is on thyroid medication) and calcium yearly.  Her dosage of lithium is so low blood levels are unnecessary.  Continue Trintellix 20mg  daily. Call if depression recurs at the lower dose. This is still the best option at preventing a relapse.  We discussed long-term risks with antidepressants which are minimal.  No evidence than any change would result in an improvement in that risk either.  As for refill of lorazepam 0.5 mg every 8 hours as needed anxiety.  She uses it very rarely.  6 mos.  She agreed with the plan.  Lynder Parents, MD, DFAPA  No future appointments.   No orders of the defined types were placed in this encounter.

## 2021-12-31 ENCOUNTER — Telehealth: Payer: Self-pay | Admitting: Gastroenterology

## 2021-12-31 NOTE — Telephone Encounter (Signed)
Patient called stating that she has relocated to [facility].  She is requesting Dr. Fuller Plan refer her to Dr. Halina Andreas of [facility], fax# [phone] and let her know that she will be a new patient and that she is due for her next colonoscopy this month (03/23). ? ?Thank you. ?

## 2021-12-31 NOTE — Telephone Encounter (Signed)
Spoke with patient regarding referral process, and that she will need her PCP to refer her since she is just relocating. Then have the new GI office request records from [place]. Number has been provided for patient via mychart. Patient verbalized understanding, no further questions. ?

## 2022-03-08 ENCOUNTER — Ambulatory Visit (INDEPENDENT_AMBULATORY_CARE_PROVIDER_SITE_OTHER): Payer: BC Managed Care – PPO | Admitting: Psychiatry

## 2022-03-08 ENCOUNTER — Encounter: Payer: Self-pay | Admitting: Psychiatry

## 2022-03-08 DIAGNOSIS — F411 Generalized anxiety disorder: Secondary | ICD-10-CM | POA: Diagnosis not present

## 2022-03-08 DIAGNOSIS — F900 Attention-deficit hyperactivity disorder, predominantly inattentive type: Secondary | ICD-10-CM | POA: Diagnosis not present

## 2022-03-08 DIAGNOSIS — Z79899 Other long term (current) drug therapy: Secondary | ICD-10-CM

## 2022-03-08 DIAGNOSIS — F339 Major depressive disorder, recurrent, unspecified: Secondary | ICD-10-CM | POA: Diagnosis not present

## 2022-03-08 DIAGNOSIS — F332 Major depressive disorder, recurrent severe without psychotic features: Secondary | ICD-10-CM

## 2022-03-08 MED ORDER — VORTIOXETINE HBR 20 MG PO TABS: 20.0000 mg | ORAL_TABLET | Freq: Every day | ORAL | 1 refills | Status: DC

## 2022-03-08 MED ORDER — LITHIUM CARBONATE 300 MG PO CAPS: 300.0000 mg | ORAL_CAPSULE | Freq: Every day | ORAL | 1 refills | Status: DC

## 2022-03-08 NOTE — Progress Notes (Signed)
Balta ?[id number] ?Apr 07, 1966 ?56 y.o.  ? ? ? ?Subjective:  ? ?Patient ID:  Autumn Foster is a 56 y.o. (DOB 10-26-1965) female. ? ?Chief Complaint:  ?Chief Complaint  ?Patient presents with  ? Follow-up  ? Depression  ? Stress  ? ? ?[name] presents to the office today for follow-up of TR severe, major depression and anxiety. ? ?on December 13, 2018.  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  ?At visit May Vyvanse 20 added Vyvanse augmentation for depression and attention and focus and productivity 20 mg daily. Got alittle more benefit for mood, energy, productivity. More normal.   Lost most off the mirtazapine weight. ? ?AT visit January 24, 2019.  The patient wanted to wean off mirtazapine because of weight gain though she has had relapse when she is done this before. ? ?At visit in August 09, 2019.  Lithium was reduced to 450 mg daily due to side effect complaints.   She remained on Trintellix 30 mg daily and remained off Vyvanse 20 mg daily.   ?Repeat lithium level at that dose was 0.3.  The prior 2 levels had been 0.4 and 0.3. ?TSH remains stable and reduced at 0.200 on levothyroxine and Cytomel 10 mg as an augmentation strategy. ? ?visit December 2020.  The following was noted: ?She reduced the lithium again to 300 mg daily and urination issues.  Still hungry and active  But can't lose weight and it's frustrating.  Logs her food.   ?Mental health is really good considering. Not depressed nor anxious.   Able to go to work.  ?Stopped Vyvanse bc urinary urgency and ravenous when it wore off at the end of the day.  It is a little better off the Vyvanse 20.  Urinary problem not resolved off the Vyvanse.   ?Plan:No benefit with reduction of lithium in  urinary SE and weight gain,  Therefore, Increase back 450 mg daily. ? ?February 13, 2020 appointment, the following is noted: ?She increased lithium to 450 mg for a couple of mos and didn't notice any change so reduced  the lithium to 300 mg daily again.  ?Able to lose to 166# but wants to be at 162#.  Keto diet helped.  Gotten my self control back. ?No depression.  Always a little anxiety and still doing DBT.  Feels a little racy.  Sleep well.  Tired most of the time. No coffee bc it wires her.  Grateful to be better. ?Plan: No benefit with reduction of lithium in  urinary SE and weight gain,  Therefore, Increase back 450 mg daily. ?Disc risk relapse. ?She wants to stay at 300 mg lithium daily.  If she sees any increase in depressive symptoms she was strongly encouraged to increase it back to 450 mg daily. ?Continue Trintellix 30 mg daily ? ?2020-09-02 appointment with the following noted: ?Great.  Some challenging life changes and working in therapy.  H new job in [place]. ?Youngest D graduating.  Will have to quit her job next summer and that's been good for her mental health.  Concerns about so many things happening at once.  Work 25 hours per week.  Needs to still come back to [place] to help out father. ?Overall depression better since summer 2020 and thinks better off the mirtazapine. ?Plan no med changes: Continue Trintellix 30 mg daily ? ?03/03/2021 appointment with the following noted: ?Stopped Trintellix 10 and now down to 20 mg daily  for over a month and is OK so far.  ?Doing really well.  Major life changes ongoing.  Father to asst living and sold his house.  He's not great.  In process of moving to [place].  Had to quit her job and worries bc it was good for me.  Plans to look for PT job in fall.  I do better with schedule. ?Patient reports stable mood and denies depressed or irritable moods.  Patient denies any recent difficulty with anxiety.  Patient denies difficulty with sleep initiation or maintenance. Denies appetite disturbance.  Patient reports that energy and motivation have been good.  Patient denies any difficulty with concentration.  Patient denies any suicidal ideation. ?Doesn't want to go back to depression  bc it was so bad. ?Plan: Continue Trintellix 20 mg daily with lithium 300 mg daily ? ?09/03/2021 appointment with the following noted: ?Doing great.  Stressful year moving to [place] and sold dad's house.  Handled it well. ?Continue Trintellix 20 mg daily with lithium 300 mg daily.  No SE ?Less super emotional than she used to be.  Evened out.  Good enjoyment and interests.  Enjoys people and [employer] football games.   ?Walking and weight lifting. ?F depressed over declining health.  Asked about a therapist for him. ?Patient reports stable mood and denies depressed or irritable moods.  Patient denies any recent difficulty with anxiety.  Patient denies difficulty with sleep initiation or maintenance. Denies appetite disturbance.  Patient reports that energy and motivation have been good.  Patient denies any difficulty with concentration.  Patient denies any suicidal ideation. ?Extremely rare lorazepam. ?Plan no med changes ? ?03/08/2022 appointment with the following noted: ?Doing great.  A little concerned that she is not feeling expected lows. ?Son 60 dx with cardiomyopathy is serious and might die.  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 ?Heart failure. Known it 10 days.  Living with them now. ?Was easy crier.   ?Living in [place].   ?D got engaged.  Also stress of F being ill and she's caretaking. ?Christian. ?  ?Sees integrative doctor  Also.  ? ?[employer] won't pay for [place]. ? ?Reviewed OCD scale with some catastrophisizing and intrusive thought and compulsive checking of kids. ? ?Previous psych med trials include Poor response to SSRI. ?sertraline, fluoxetine,  Lexapro, citalopram, duloxetine,  ?mirazapine '60mg'$  helped anxiety but cause weight gain,  ?Viibryd which was ineffective, Deplin,  ?bupropion which increased her anxiety, Buspirone with celexa.   ?Trintellix 30 good response ?lithium 900 tremor. ?lamotrigine which caused a rash twice,  ?clonazepam which caused her to be sleepy and have a headache, Pristiq  which caused her to "jump out of my skin",  ?Abilify with muscle twitches, buspirone uncontrollable crying,  Seroquel, pramipexole which cause muscle twitching, Latuda which was ineffective, ?Nuvigil,  ?gabapentin which caused her to feel tired and loopy, hydroxyzine which cause dizziness,  trazodone joint pain,   ? ?Hosp for SI wtith plan to cut wrists, wrote a sui note and H stopped her on Aug 25 and hosp then until 8/29 and started PHP for 3 weeks. Helped some. ?Then [facility] did IV infusion #6 from9-17 to 9-26.  Resolved SI and depression much improved.  Did another ketamine infusion Sep 10, 2018 without much improvement. ? ?Review of Systems:  ?Review of Systems  ?Constitutional:  Negative for appetite change.  ?Cardiovascular:  Negative for palpitations.  ?Gastrointestinal:  Negative for constipation and nausea.  ?Genitourinary:  Negative for frequency and urgency.  ?Neurological:  Negative for dizziness and tremors.  ?Psychiatric/Behavioral:  Negative for agitation, behavioral problems, confusion, decreased concentration, hallucinations, sleep disturbance and suicidal ideas. The patient is not nervous/anxious and is not hyperactive.   ? ?Medications: I have reviewed the patient's current medications. ? ?Current Outpatient Medications  ?Medication Sig Dispense Refill  ? Cholecalciferol (VITAMIN D3) 1.25 MG (50000 UT) CAPS Take 1 capsule by mouth daily.    ? desmopressin (DDAVP) 0.1 MG tablet desmopressin 0.1 mg tablet ? TAKE 1 TABLET BY MOUTH TWICE A DAY WITH 1 TABLET IN THE AFTERNOON AS NEEDED    ? levocetirizine (XYZAL) 5 MG tablet 5 mg every evening.    ? levothyroxine (SYNTHROID) 88 MCG tablet 88 mcg.     ? liothyronine (CYTOMEL) 5 MCG tablet Take 5 mcg by mouth daily. Take 3 tabs in the morning and one in the afternoon    ? lithium carbonate 300 MG capsule Take 1 capsule (300 mg total) by mouth at bedtime. 90 capsule 1  ? Magnesium Sulfate 70 MG CAPS Take by mouth.    ? Omega-3 Fatty  Acids (FISH OIL PO) Take 1 g by mouth daily.    ? progesterone (PROMETRIUM) 100 MG capsule progesterone micronized 100 mg capsule ? TAKE 2 CAPSULES BY MOUTH AT BEDTIME    ? valACYclovir (VALTREX) 500 MG t

## 2022-05-30 ENCOUNTER — Other Ambulatory Visit: Payer: Self-pay | Admitting: Psychiatry

## 2022-05-30 DIAGNOSIS — F332 Major depressive disorder, recurrent severe without psychotic features: Secondary | ICD-10-CM

## 2022-07-19 ENCOUNTER — Telehealth: Payer: Self-pay | Admitting: Psychiatry

## 2022-07-19 NOTE — Telephone Encounter (Signed)
PT LVM @ 1:50p.  She said she and Dr. Clovis Pu talked about reducing her Trintellix down to '15mg'$ .  She would need samples to get to that dosage.  Pls call her when ready to pick up.  Next appt 11/28

## 2022-07-19 NOTE — Telephone Encounter (Signed)
Pt wants to reduce trintellix to 15 mg as discussed.Ok to give samples?

## 2022-07-19 NOTE — Telephone Encounter (Signed)
Now is actually the perfect time to try reducing the Trintellix.  If we do it now then when she has her appointment she can tell me whether it was an effective change for her.   Send in a prescription for 10 mg tablets 1 daily #90 with no refill.  Then give her samples of about 4 to 6 boxes of 5 mg tablets depending on her current supply.

## 2022-07-20 ENCOUNTER — Other Ambulatory Visit: Payer: Self-pay

## 2022-07-20 DIAGNOSIS — F339 Major depressive disorder, recurrent, unspecified: Secondary | ICD-10-CM

## 2022-07-20 MED ORDER — VORTIOXETINE HBR 10 MG PO TABS: 10.0000 mg | ORAL_TABLET | Freq: Every day | ORAL | 0 refills | Status: DC

## 2022-07-20 NOTE — Telephone Encounter (Signed)
Pt informed rx sent.

## 2022-08-31 ENCOUNTER — Other Ambulatory Visit: Payer: Self-pay | Admitting: Obstetrics and Gynecology

## 2022-08-31 DIAGNOSIS — M8580 Other specified disorders of bone density and structure, unspecified site: Secondary | ICD-10-CM

## 2022-09-06 ENCOUNTER — Telehealth: Payer: Self-pay | Admitting: Psychiatry

## 2022-09-06 NOTE — Telephone Encounter (Signed)
Pt lvm that she has an appt 11/28. She needs to have more samples of trintellix 5 mg. Please call her at [phone]

## 2022-09-06 NOTE — Telephone Encounter (Signed)
Pulled samples and notified patient. She said her husband will be working in [employer] and will come get them.

## 2022-09-21 ENCOUNTER — Ambulatory Visit (INDEPENDENT_AMBULATORY_CARE_PROVIDER_SITE_OTHER): Payer: BC Managed Care – PPO | Admitting: Psychiatry

## 2022-09-21 ENCOUNTER — Encounter: Payer: Self-pay | Admitting: Psychiatry

## 2022-09-21 DIAGNOSIS — F339 Major depressive disorder, recurrent, unspecified: Secondary | ICD-10-CM

## 2022-09-21 MED ORDER — VORTIOXETINE HBR 20 MG PO TABS: 20.0000 mg | ORAL_TABLET | Freq: Every day | ORAL | 1 refills | Status: DC

## 2022-09-21 NOTE — Progress Notes (Signed)
ADWOA AXE [id number] Nov 24, 1965 56 y.o.     Subjective:   Patient ID:  Autumn Foster is a 56 y.o. (DOB [birth year]) female.  Chief Complaint:  Chief Complaint  Patient presents with   Follow-up   Depression   Anxiety    EMAREE CHIU presents to the office today for follow-up of TR severe, major depression and anxiety.  on December 13, 2018.  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  At visit May Vyvanse 20 added Vyvanse augmentation for depression and attention and focus and productivity 20 mg daily. Got alittle more benefit for mood, energy, productivity. More normal.   Lost most off the mirtazapine weight.  AT visit January 24, 2019.  The patient wanted to wean off mirtazapine because of weight gain though she has had relapse when she is done this before.  At visit in August 09, 2019.  Lithium was reduced to 450 mg daily due to side effect complaints.   She remained on Trintellix 30 mg daily and remained off Vyvanse 20 mg daily.   Repeat lithium level at that dose was 0.3.  The prior 2 levels had been 0.4 and 0.3. TSH remains stable and reduced at 0.200 on levothyroxine and Cytomel 10 mg as an augmentation strategy.  visit December 2020.  The following was noted: She reduced the lithium again to 300 mg daily and urination issues.  Still hungry and active  But can't lose weight and it's frustrating.  Logs her food.   Mental health is really good considering. Not depressed nor anxious.   Able to go to work.  Stopped Vyvanse bc urinary urgency and ravenous when it wore off at the end of the day.  It is a little better off the Vyvanse 20.  Urinary problem not resolved off the Vyvanse.   Plan:No benefit with reduction of lithium in  urinary SE and weight gain,  Therefore, Increase back 450 mg daily.  February 13, 2020 appointment, the following is noted: She increased lithium to 450 mg for a couple of mos and didn't notice any change so  reduced the lithium to 300 mg daily again.  Able to lose to 166# but wants to be at 162#.  Keto diet helped.  Gotten my self control back. No depression.  Always a little anxiety and still doing DBT.  Feels a little racy.  Sleep well.  Tired most of the time. No coffee bc it wires her.  Grateful to be better. Plan: No benefit with reduction of lithium in  urinary SE and weight gain,  Therefore, Increase back 450 mg daily. Disc risk relapse. She wants to stay at 300 mg lithium daily.  If she sees any increase in depressive symptoms she was strongly encouraged to increase it back to 450 mg daily. Continue Trintellix 30 mg daily  2020-09-02 appointment with the following noted: Great.  Some challenging life changes and working in therapy.  H new job in [place]. Youngest D graduating.  Will have to quit her job next summer and that's been good for her mental health.  Concerns about so many things happening at once.  Work 25 hours per week.  Needs to still come back to [place] to help out father. Overall depression better since summer 2020 and thinks better off the mirtazapine. Plan no med changes: Continue Trintellix 30 mg daily  03/03/2021 appointment with the following noted: Stopped Trintellix 10 and now down to 20 mg daily  for over a month and is OK so far.  Doing really well.  Major life changes ongoing.  Father to asst living and sold his house.  He's not great.  In process of moving to [place].  Had to quit her job and worries bc it was good for me.  Plans to look for PT job in fall.  I do better with schedule. Patient reports stable mood and denies depressed or irritable moods.  Patient denies any recent difficulty with anxiety.  Patient denies difficulty with sleep initiation or maintenance. Denies appetite disturbance.  Patient reports that energy and motivation have been good.  Patient denies any difficulty with concentration.  Patient denies any suicidal ideation. Doesn't want to go back to  depression bc it was so bad. Plan: Continue Trintellix 20 mg daily with lithium 300 mg daily  09/03/2021 appointment with the following noted: Doing great.  Stressful year moving to [place] and sold dad's house.  Handled it well. Continue Trintellix 20 mg daily with lithium 300 mg daily.  No SE Less super emotional than she used to be.  Evened out.  Good enjoyment and interests.  Enjoys people and [employer] football games.   Walking and weight lifting. F depressed over declining health.  Asked about a therapist for him. Patient reports stable mood and denies depressed or irritable moods.  Patient denies any recent difficulty with anxiety.  Patient denies difficulty with sleep initiation or maintenance. Denies appetite disturbance.  Patient reports that energy and motivation have been good.  Patient denies any difficulty with concentration.  Patient denies any suicidal ideation. Extremely rare lorazepam. Plan no med changes  03/08/2022 appointment with the following noted: Doing great.  A little concerned that she is not feeling expected lows. Son 59 dx with cardiomyopathy is serious and might die.  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 Heart failure. Known it 10 days.  Living with them now. Was easy crier.   Living in [place].   D got engaged.  Also stress of F being ill and she's caretaking. Christian.  09/21/22 appt noted: Reduced Trintellix to 15 mg daily end of September 2023.  4 days took 10 mg and felt cotton headed.  Not rally depressed but less great with less meds. Some worry over longterm effects of Trintellix but quality of life is important.   Can't tell that reducing Trintellix has enabled her emotional range to be greater. Havent' been depressed.  Has felt a little less motivated than she was.  Not dramatic. Son stable with cardiomyopathy.     Sees integrative doctor  Also.   [employer] won't pay for [place].  Reviewed OCD scale with some catastrophisizing and intrusive thought  and compulsive checking of kids.  Previous psych med trials include Poor response to SSRI. sertraline, fluoxetine,  Lexapro, citalopram, duloxetine,  mirazapine '60mg'$  helped anxiety but cause weight gain,  Viibryd which was ineffective, Deplin,  bupropion which increased her anxiety, Buspirone with celexa.   Trintellix 30 good response lithium 900 tremor. lamotrigine which caused a rash twice,  clonazepam which caused her to be sleepy and have a headache, Pristiq which caused her to "jump out of my skin",  Abilify with muscle twitches, buspirone uncontrollable crying,  Seroquel, pramipexole which cause muscle twitching, Latuda which was ineffective, Nuvigil,  gabapentin which caused her to feel tired and loopy, hydroxyzine which cause dizziness,  trazodone joint pain,    Hosp for SI wtith plan to cut wrists, wrote a sui note and H stopped her  on Aug 25 and hosp then until 8/29 and started PHP for 3 weeks. Helped some. Then [facility] did IV infusion #6 from9-17 to 9-26.  Resolved SI and depression much improved.  Did another ketamine infusion Sep 10, 2018 without much improvement.  Review of Systems:  Review of Systems  Constitutional:  Negative for appetite change.  Cardiovascular:  Negative for palpitations.  Gastrointestinal:  Negative for constipation and nausea.  Genitourinary:  Negative for frequency.  Neurological:  Negative for dizziness and tremors.  Psychiatric/Behavioral:  Negative for agitation, behavioral problems, confusion, decreased concentration, hallucinations, sleep disturbance and suicidal ideas. The patient is not nervous/anxious and is not hyperactive.     Medications: I have reviewed the patient's current medications.  Current Outpatient Medications  Medication Sig Dispense Refill   Cholecalciferol (VITAMIN D3) 1.25 MG (50000 UT) CAPS Take 1 capsule by mouth daily.     desmopressin (DDAVP) 0.1 MG tablet desmopressin 0.1 mg tablet  TAKE 1 TABLET  BY MOUTH TWICE A DAY WITH 1 TABLET IN THE AFTERNOON AS NEEDED     levocetirizine (XYZAL) 5 MG tablet 5 mg every evening.     levothyroxine (SYNTHROID) 88 MCG tablet 88 mcg.      liothyronine (CYTOMEL) 5 MCG tablet Take 5 mcg by mouth daily. Take 3 tabs in the morning and one in the afternoon     lithium carbonate 300 MG capsule Take 1 capsule (300 mg total) by mouth at bedtime. 90 capsule 1   Magnesium Sulfate 70 MG CAPS Take by mouth.     Omega-3 Fatty Acids (FISH OIL PO) Take 1 g by mouth daily.     progesterone (PROMETRIUM) 100 MG capsule progesterone micronized 100 mg capsule  TAKE 2 CAPSULES BY MOUTH AT BEDTIME     valACYclovir (VALTREX) 500 MG tablet valacyclovir 500 mg tablet  TAKE 1 TABLET BY MOUTH EVERY DAY     zinc gluconate 50 MG tablet Take 50 mg by mouth daily.     vortioxetine HBr (TRINTELLIX) 20 MG TABS tablet Take 1 tablet (20 mg total) by mouth daily. 90 tablet 1   No current facility-administered medications for this visit.    Medication Side Effects:   Dry mouth, worse constipation.  Allergies:  Allergies  Allergen Reactions   Lexapro [Escitalopram Oxalate] Other (See Comments)    Insomnia    Pristiq [Desvenlafaxine]     Dry heaves/dizzy   Prozac [Fluoxetine Hcl]     HA and Strange Dreams   Septra Ds [Sulfamethoxazole-Trimethoprim]     Jitteriness/hyper/affecting sleep   Penicillins Rash    Childhood rash    Past Medical History:  Diagnosis Date   Anemia    Anxiety    ASCUS on Pap smear    Bacterial infection    Bladder spasm    Blood in stool    Cancer (HCC)    Basal Cell Skin Cancer   Candida vaginitis    Cervical dysplasia    Colon polyp    tubular adenoma   Depression    Dizziness 04/05/2016   Dysmenorrhea    Endometrial polyp    Fatigue    Genital warts    Hemorrhoids    Herpes    History of chicken pox    HPV (human papilloma virus) infection    Low blood pressure    Lyme disease    Menorrhagia    Orthostatic hypotension 04/05/2016    Osteopenia    Ovarian cyst    PMDD (premenstrual dysphoric  disorder)    POTS (postural orthostatic tachycardia syndrome) 04/05/2016   Rectal bleed    Urinary frequency    Urine incontinence    Yeast infection     Family History  Problem Relation Age of Onset   Prostate cancer Father    Hypertension Father    Renal cancer Father    Migraines Mother    Alcohol abuse Mother    Suicidality Mother        deceased   Colon cancer Paternal Grandfather    Dementia Paternal Grandmother    Thyroid disease Maternal Grandmother    Rheum arthritis Maternal Grandmother    Diabetes Maternal Grandfather    Anxiety disorder Sister        x 2    Social History   Socioeconomic History   Marital status: Married    Spouse name: Jaci Standard    Number of children: 3   Years of education: college   Highest education level: Not on file  Occupational History   Not on file  Tobacco Use   Smoking status: Never   Smokeless tobacco: Never  Vaping Use   Vaping Use: Never used  Substance and Sexual Activity   Alcohol use: Yes    Alcohol/week: 0.0 standard drinks of alcohol    Comment: 1-2 drinks/week   Drug use: No   Sexual activity: Yes    Partners: Male    Birth control/protection: None    Comment: vasectony  Other Topics Concern   Not on file  Social History Narrative   Drinks 1 cup of coffee a day    Social Determinants of Health   Financial Resource Strain: Low Risk  (06/23/2018)   Overall Financial Resource Strain (CARDIA)    Difficulty of Paying Living Expenses: Not hard at all  Food Insecurity: No Food Insecurity (06/23/2018)   Hunger Vital Sign    Worried About Running Out of Food in the Last Year: Never true    Ran Out of Food in the Last Year: Never true  Transportation Needs: No Transportation Needs (06/23/2018)   PRAPARE - Hydrologist (Medical): No    Lack of Transportation (Non-Medical): No  Physical Activity: Sufficiently Active (06/23/2018)    Exercise Vital Sign    Days of Exercise per Week: 5 days    Minutes of Exercise per Session: 30 min  Stress: No Stress Concern Present (06/23/2018)   [place]    Feeling of Stress : Not at all  Social Connections: Moderately Integrated (06/23/2018)   Social Connection and Isolation Panel [NHANES]    Frequency of Communication with Friends and Family: More than three times a week    Frequency of Social Gatherings with Friends and Family: More than three times a week    Attends Religious Services: More than 4 times per year    Active Member of [employer] or Organizations: No    Attends Archivist Meetings: Never    Marital Status: Married  Human resources officer Violence: Not At Risk (06/23/2018)   Humiliation, Afraid, Rape, and Kick questionnaire    Fear of Current or Ex-Partner: No    Emotionally Abused: No    Physically Abused: No    Sexually Abused: No   M hx multiple SA and died of Suicide at 24 yo.  Her aunt also. 2 of 3 kids on Vyvanse and done well. Past Medical History, Surgical history, Social history, and Family history were reviewed  and updated as appropriate.   Please see review of systems for further details on the patient's review from today.   Objective:   Physical Exam:  There were no vitals taken for this visit.  Physical Exam Constitutional:      General: She is not in acute distress.    Appearance: Normal appearance. She is well-developed and normal weight.  Neurological:     Mental Status: She is alert and oriented to person, place, and time.     Cranial Nerves: No dysarthria.     Coordination: Coordination normal.  Psychiatric:        Attention and Perception: Attention and perception normal. She does not perceive auditory or visual hallucinations.        Mood and Affect: Mood is not anxious or depressed. Affect is not labile, blunt, angry or inappropriate.        Speech: Speech normal.  Speech is not slurred.        Behavior: Behavior normal. Behavior is cooperative.        Thought Content: Thought content normal. Thought content is not paranoid or delusional. Thought content does not include homicidal or suicidal ideation. Thought content does not include suicidal plan.        Cognition and Memory: Cognition and memory normal.        Judgment: Judgment normal.     Comments: Overall doing very well with regard to mood.  Dramatically better than 2019.     Lab Review:     Component Value Date/Time   NA 137 10/31/2018 1422   K 4.4 10/31/2018 1422   CL 98 10/31/2018 1422   CO2 25 10/31/2018 1422   GLUCOSE 88 10/31/2018 1422   GLUCOSE 134 (H) 06/18/2018 1821   BUN 16 10/31/2018 1422   CREATININE 0.67 10/31/2018 1422   CALCIUM 9.9 10/31/2018 1422   PROT 7.1 06/18/2018 1821   PROT 7.6 03/31/2016 0932   ALBUMIN 4.6 06/18/2018 1821   ALBUMIN 5.1 03/31/2016 0932   AST 20 06/18/2018 1821   ALT 16 06/18/2018 1821   ALKPHOS 40 06/18/2018 1821   BILITOT 0.6 06/18/2018 1821   BILITOT 0.4 03/31/2016 0932   GFRNONAA 101 10/31/2018 1422   GFRAA 116 10/31/2018 1422       Component Value Date/Time   WBC 5.9 08/19/2020 1610   RBC 4.33 08/19/2020 1610   HGB 13.3 08/19/2020 1610   HCT 40.3 08/19/2020 1610   PLT 290.0 08/19/2020 1610   MCV 93.2 08/19/2020 1610   MCH 31.4 06/18/2018 1821   MCHC 33.0 08/19/2020 1610   RDW 12.9 08/19/2020 1610   LYMPHSABS 1.7 08/19/2020 1610   MONOABS 0.5 08/19/2020 1610   EOSABS 0.0 08/19/2020 1610   BASOSABS 0.0 08/19/2020 1610    Lithium Lvl  Date Value Ref Range Status  08/27/2019 0.3 (L) 0.6 - 1.2 mmol/L Final    Comment:                                     Detection Limit = 0.1                           <0.1 indicates None Detected    on '600mg'$ /D  No results found for: "PHENYTOIN", "PHENOBARB", "VALPROATE", "CBMZ"   .res Assessment: Plan:    Amyia was seen today for follow-up, depression and anxiety.  Diagnoses and all  orders for this visit:  Recurrent major depression resistant to treatment ([place]) -     vortioxetine HBr (TRINTELLIX) 20 MG TABS tablet; Take 1 tablet (20 mg total) by mouth daily.     We discussed her history of severe treatment resistant major depression and anxiety.  We reviewed the long list of failed psychiatric medications in detail.  Many of which were more intolerances and did not receive adequate trials.   She has taken all the things with the least side effect risk.  Her symptoms have been very severe and fortunately have now been controlled for over a year with Trintellix 20 and lithium 300 mg daily.  Overall she is better than before the current meds.  She historically has doubted the benefit of lithium but in the opinion of the physician this low-dose lithium is likely helping significantly.  Disc value of routine in preventing depressive relapse..  Disc risk relapse. She wants to stay at 300 mg lithium daily.    Completed DBT  Counseled patient regarding potential benefits, risks, and side effects of lithium to include potential risk of lithium affecting thyroid and renal function.  Discussed need for periodic lab monitoring to determine drug level and to assess for potential adverse effects.  Counseled patient regarding signs and symptoms of lithium toxicity and advised that they notify office immediately or seek urgent medical attention if experiencing these signs and symptoms.  Patient advised to contact office with any questions or concerns. She is taking a very tiny dose of lithium. Discussed the need to monitor kidney function, thyroid function (she is on thyroid medication) and calcium yearly.  Her dosage of lithium is so low blood levels are unnecessary.  Continue Trintellix '20mg'$  daily. May consider dropping to 15 mg  DT ?  blunting again next year sometime using samples.   This is still the best option at preventing a relapse.  We discussed long-term risks with  antidepressants which are minimal.  No evidence than any change would result in an improvement in that risk either.  As for refill of lorazepam 0.5 mg every 8 hours as needed anxiety.  She uses it very rarely.  6 mos.  She agreed with the plan.  Lynder Parents, MD, DFAPA  Future Appointments  Date Time Provider [place]  11/04/2022 11:00 AM [facility]     No orders of the defined types were placed in this encounter.

## 2022-11-04 ENCOUNTER — Ambulatory Visit
Admission: RE | Admit: 2022-11-04 | Discharge: 2022-11-04 | Disposition: A | Discharge status: Home / Self Care | Payer: BC Managed Care – PPO | Source: Ambulatory Visit | Attending: Obstetrics and Gynecology | Admitting: Obstetrics and Gynecology

## 2022-11-04 ENCOUNTER — Other Ambulatory Visit: Payer: Self-pay | Admitting: Obstetrics and Gynecology

## 2022-11-04 DIAGNOSIS — Z1231 Encounter for screening mammogram for malignant neoplasm of breast: Secondary | ICD-10-CM

## 2022-11-04 DIAGNOSIS — M8580 Other specified disorders of bone density and structure, unspecified site: Secondary | ICD-10-CM

## 2023-02-09 ENCOUNTER — Other Ambulatory Visit: Payer: Self-pay | Admitting: Psychiatry

## 2023-02-09 DIAGNOSIS — F332 Major depressive disorder, recurrent severe without psychotic features: Secondary | ICD-10-CM

## 2023-02-22 ENCOUNTER — Telehealth: Payer: Self-pay | Admitting: Psychiatry

## 2023-02-22 DIAGNOSIS — F339 Major depressive disorder, recurrent, unspecified: Secondary | ICD-10-CM

## 2023-02-22 MED ORDER — VORTIOXETINE HBR 20 MG PO TABS: 20.0000 mg | ORAL_TABLET | Freq: Every day | ORAL | 0 refills | Status: DC

## 2023-02-22 NOTE — Telephone Encounter (Signed)
Sent!

## 2023-02-22 NOTE — Telephone Encounter (Signed)
Pt is requesting RF of Trintellix 20mg  for 90 day supply. Please send to:  APPT 5/29 [id number] [place], [place] - [address]  [address], [place] [address]

## 2023-03-23 ENCOUNTER — Encounter: Payer: Self-pay | Admitting: Psychiatry

## 2023-03-23 ENCOUNTER — Ambulatory Visit (INDEPENDENT_AMBULATORY_CARE_PROVIDER_SITE_OTHER): Payer: BC Managed Care – PPO | Admitting: Psychiatry

## 2023-03-23 DIAGNOSIS — F900 Attention-deficit hyperactivity disorder, predominantly inattentive type: Secondary | ICD-10-CM

## 2023-03-23 DIAGNOSIS — Z79899 Other long term (current) drug therapy: Secondary | ICD-10-CM

## 2023-03-23 DIAGNOSIS — F411 Generalized anxiety disorder: Secondary | ICD-10-CM | POA: Diagnosis not present

## 2023-03-23 DIAGNOSIS — F339 Major depressive disorder, recurrent, unspecified: Secondary | ICD-10-CM

## 2023-03-23 DIAGNOSIS — F332 Major depressive disorder, recurrent severe without psychotic features: Secondary | ICD-10-CM

## 2023-03-23 MED ORDER — VORTIOXETINE HBR 20 MG PO TABS: 20.0000 mg | ORAL_TABLET | Freq: Every day | ORAL | 3 refills | Status: DC

## 2023-03-23 MED ORDER — LITHIUM CARBONATE 300 MG PO CAPS: 300.0000 mg | ORAL_CAPSULE | Freq: Every evening | ORAL | 3 refills | Status: DC

## 2023-03-23 NOTE — Progress Notes (Signed)
Autumn Foster [id number] [birth year] 57 y.o.     Subjective:   Patient ID:  Autumn Foster is a 57 y.o. (DOB Jul 03, 1966) female.  Chief Complaint:  Chief Complaint  Patient presents with   Follow-up   Sleeping Problem    [name] presents to the office today for follow-up of TR severe, major depression and anxiety.  on December 13, 2018.  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  At visit May Vyvanse 20 added Vyvanse augmentation for depression and attention and focus and productivity 20 mg daily. Got alittle more benefit for mood, energy, productivity. More normal.   Lost most off the mirtazapine weight.  AT visit January 24, 2019.  The patient wanted to wean off mirtazapine because of weight gain though she has had relapse when she is done this before.  At visit in August 09, 2019.  Lithium was reduced to 450 mg daily due to side effect complaints.   She remained on Trintellix 30 mg daily and remained off Vyvanse 20 mg daily.   Repeat lithium level at that dose was 0.3.  The prior 2 levels had been 0.4 and 0.3. TSH remains stable and reduced at 0.200 on levothyroxine and Cytomel 10 mg as an augmentation strategy.  visit December 2020.  The following was noted: She reduced the lithium again to 300 mg daily and urination issues.  Still hungry and active  But can't lose weight and it's frustrating.  Logs her food.   Mental health is really good considering. Not depressed nor anxious.   Able to go to work.  Stopped Vyvanse bc urinary urgency and ravenous when it wore off at the end of the day.  It is a little better off the Vyvanse 20.  Urinary problem not resolved off the Vyvanse.   Plan:No benefit with reduction of lithium in  urinary SE and weight gain,  Therefore, Increase back 450 mg daily.  February 13, 2020 appointment, the following is noted: She increased lithium to 450 mg for a couple of mos and didn't notice any change so reduced the  lithium to 300 mg daily again.  Able to lose to 166# but wants to be at 162#.  Keto diet helped.  Gotten my self control back. No depression.  Always a little anxiety and still doing DBT.  Feels a little racy.  Sleep well.  Tired most of the time. No coffee bc it wires her.  Grateful to be better. Plan: No benefit with reduction of lithium in  urinary SE and weight gain,  Therefore, Increase back 450 mg daily. Disc risk relapse. She wants to stay at 300 mg lithium daily.  If she sees any increase in depressive symptoms she was strongly encouraged to increase it back to 450 mg daily. Continue Trintellix 30 mg daily  2020-09-02 appointment with the following noted: Great.  Some challenging life changes and working in therapy.  H new job in [place]. Youngest D graduating.  Will have to quit her job next summer and that's been good for her mental health.  Concerns about so many things happening at once.  Work 25 hours per week.  Needs to still come back to [place] to help out father. Overall depression better since summer 2020 and thinks better off the mirtazapine. Plan no med changes: Continue Trintellix 30 mg daily  03/03/2021 appointment with the following noted: Stopped Trintellix 10 and now down to 20 mg daily for over  a month and is OK so far.  Doing really well.  Major life changes ongoing.  Father to asst living and sold his house.  He's not great.  In process of moving to [place].  Had to quit her job and worries bc it was good for me.  Plans to look for PT job in fall.  I do better with schedule. Patient reports stable mood and denies depressed or irritable moods.  Patient denies any recent difficulty with anxiety.  Patient denies difficulty with sleep initiation or maintenance. Denies appetite disturbance.  Patient reports that energy and motivation have been good.  Patient denies any difficulty with concentration.  Patient denies any suicidal ideation. Doesn't want to go back to depression bc it  was so bad. Plan: Continue Trintellix 20 mg daily with lithium 300 mg daily  09/03/2021 appointment with the following noted: Doing great.  Stressful year moving to [place] and sold dad's house.  Handled it well. Continue Trintellix 20 mg daily with lithium 300 mg daily.  No SE Less super emotional than she used to be.  Evened out.  Good enjoyment and interests.  Enjoys people and [employer] football games.   Walking and weight lifting. F depressed over declining health.  Asked about a therapist for him. Patient reports stable mood and denies depressed or irritable moods.  Patient denies any recent difficulty with anxiety.  Patient denies difficulty with sleep initiation or maintenance. Denies appetite disturbance.  Patient reports that energy and motivation have been good.  Patient denies any difficulty with concentration.  Patient denies any suicidal ideation. Extremely rare lorazepam. Plan no med changes  03/08/2022 appointment with the following noted: Doing great.  A little concerned that she is not feeling expected lows. Son 17 dx with cardiomyopathy is serious and might die.  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 Heart failure. Known it 10 days.  Living with them now. Was easy crier.   Living in [place].   D got engaged.  Also stress of F being ill and she's caretaking. Christian.  09/21/22 appt noted: Reduced Trintellix to 15 mg daily end of September 2023.  4 days took 10 mg and felt cotton headed.  Not rally depressed but less great with less meds. Some worry over longterm effects of Trintellix but quality of life is important.   Can't tell that reducing Trintellix has enabled her emotional range to be greater. Havent' been depressed.  Has felt a little less motivated than she was.  Not dramatic. Son stable with cardiomyopathy.    03/22/22 appt noted: Meds: Trintellix 20 mg daily, lithium 300 mg daily. No SE H is 57 yo.   Still comes to [place] once or twice a week to see Dad. Still  doing well.  No sig depression or anxiety.  Doesn't want to go backwards.   Feels HRT is hleping mood also.  Robinhood integration with estrogen and testosterone pellets and oral progesterone.   Satisfied with meds.   D getting married in [place]. 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 Son better with heart.   Sees integrative doctor  Also.   [employer] won't pay for [place].  Reviewed OCD scale with some catastrophisizing and intrusive thought and compulsive checking of kids.  Previous psych med trials include Poor response to SSRI. sertraline, fluoxetine,  Lexapro, citalopram, duloxetine,  mirazapine 60mg  helped anxiety but cause weight gain,  Viibryd which was ineffective, Deplin,  bupropion which increased her anxiety, Buspirone with celexa.   Trintellix 30 good response lithium 900  tremor. lamotrigine which caused a rash twice,  clonazepam which caused her to be sleepy and have a headache, Pristiq which caused her to "jump out of my skin",  Abilify with muscle twitches, buspirone uncontrollable crying,  Seroquel, pramipexole which cause muscle twitching, Latuda which was ineffective, Nuvigil,  gabapentin which caused her to feel tired and loopy, hydroxyzine which cause dizziness,  trazodone joint pain,    Hosp for SI wtith plan to cut wrists, wrote a sui note and H stopped her on Aug 25 and hosp then until 8/29 in 2019 and started PHP for 3 weeks. Helped some. Then [facility] did IV infusion #6 from9-17 to 9-26.  Resolved SI and depression much improved.  Did another ketamine infusion Sep 10, 2018 without much improvement.  M committed suicide  Review of Systems:  Review of Systems  Constitutional:  Negative for appetite change.  Cardiovascular:  Negative for palpitations.  Gastrointestinal:  Negative for constipation and nausea.  Genitourinary:  Negative for frequency.  Musculoskeletal:  Positive for arthralgias and back pain.  Neurological:  Negative for dizziness and  tremors.  Psychiatric/Behavioral:  Negative for agitation, behavioral problems, confusion, decreased concentration, hallucinations, sleep disturbance and suicidal ideas. The patient is not nervous/anxious and is not hyperactive.     Medications: I have reviewed the patient's current medications.  Current Outpatient Medications  Medication Sig Dispense Refill   Cholecalciferol (VITAMIN D3) 1.25 MG (50000 UT) CAPS Take 1 capsule by mouth daily.     desmopressin (DDAVP) 0.1 MG tablet desmopressin 0.1 mg tablet  TAKE 1 TABLET BY MOUTH TWICE A DAY WITH 1 TABLET IN THE AFTERNOON AS NEEDED     levocetirizine (XYZAL) 5 MG tablet 5 mg every evening.     levothyroxine (SYNTHROID) 88 MCG tablet 88 mcg.      liothyronine (CYTOMEL) 5 MCG tablet Take 5 mcg by mouth daily. Take 3 tabs in the morning and one in the afternoon     Magnesium Sulfate 70 MG CAPS Take by mouth.     Omega-3 Fatty Acids (FISH OIL PO) Take 1 g by mouth daily.     progesterone (PROMETRIUM) 100 MG capsule progesterone micronized 100 mg capsule  TAKE 2 CAPSULES BY MOUTH AT BEDTIME     valACYclovir (VALTREX) 500 MG tablet valacyclovir 500 mg tablet  TAKE 1 TABLET BY MOUTH EVERY DAY     zinc gluconate 50 MG tablet Take 50 mg by mouth daily.     lithium carbonate 300 MG capsule Take 1 capsule (300 mg total) by mouth at bedtime. 90 capsule 3   vortioxetine HBr (TRINTELLIX) 20 MG TABS tablet Take 1 tablet (20 mg total) by mouth daily. 90 tablet 3   No current facility-administered medications for this visit.    Medication Side Effects:   Dry mouth, worse constipation.  Allergies:  Allergies  Allergen Reactions   Lexapro [Escitalopram Oxalate] Other (See Comments)    Insomnia    Pristiq [Desvenlafaxine]     Dry heaves/dizzy   Prozac [Fluoxetine Hcl]     HA and Strange Dreams   Septra Ds [Sulfamethoxazole-Trimethoprim]     Jitteriness/hyper/affecting sleep   Penicillins Rash    Childhood rash    Past Medical History:   Diagnosis Date   Anemia    Anxiety    ASCUS on Pap smear    Bacterial infection    Bladder spasm    Blood in stool    Cancer (HCC)    Basal Cell Skin Cancer  Candida vaginitis    Cervical dysplasia    Colon polyp    tubular adenoma   Depression    Dizziness 04/05/2016   Dysmenorrhea    Endometrial polyp    Fatigue    Genital warts    Hemorrhoids    Herpes    History of chicken pox    HPV (human papilloma virus) infection    Low blood pressure    Lyme disease    Menorrhagia    Orthostatic hypotension 04/05/2016   Osteopenia    Ovarian cyst    PMDD (premenstrual dysphoric disorder)    POTS (postural orthostatic tachycardia syndrome) 04/05/2016   Rectal bleed    Urinary frequency    Urine incontinence    Yeast infection     Family History  Problem Relation Age of Onset   Prostate cancer Father    Hypertension Father    Renal cancer Father    Migraines Mother    Alcohol abuse Mother    Suicidality Mother        deceased   Colon cancer Paternal Grandfather    Dementia Paternal Grandmother    Thyroid disease Maternal Grandmother    Rheum arthritis Maternal Grandmother    Diabetes Maternal Grandfather    Anxiety disorder Sister        x 2    Social History   Socioeconomic History   Marital status: Married    Spouse name: Fraser Din    Number of children: 3   Years of education: college   Highest education level: Not on file  Occupational History   Not on file  Tobacco Use   Smoking status: Never   Smokeless tobacco: Never  Vaping Use   Vaping Use: Never used  Substance and Sexual Activity   Alcohol use: Yes    Alcohol/week: 0.0 standard drinks of alcohol    Comment: 1-2 drinks/week   Drug use: No   Sexual activity: Yes    Partners: Male    Birth control/protection: None    Comment: vasectony  Other Topics Concern   Not on file  Social History Narrative   Drinks 1 cup of coffee a day    Social Determinants of Health   Financial Resource  Strain: Low Risk  (06/23/2018)   Overall Financial Resource Strain (CARDIA)    Difficulty of Paying Living Expenses: Not hard at all  Food Insecurity: No Food Insecurity (06/23/2018)   Hunger Vital Sign    Worried About Running Out of Food in the Last Year: Never true    Ran Out of Food in the Last Year: Never true  Transportation Needs: No Transportation Needs (06/23/2018)   PRAPARE - Administrator, Civil Service (Medical): No    Lack of Transportation (Non-Medical): No  Physical Activity: Sufficiently Active (06/23/2018)   Exercise Vital Sign    Days of Exercise per Week: 5 days    Minutes of Exercise per Session: 30 min  Stress: No Stress Concern Present (06/23/2018)   [employer] of Occupational Health - Occupational Stress Questionnaire    Feeling of Stress : Not at all  Social Connections: Moderately Integrated (06/23/2018)   Social Connection and Isolation Panel [NHANES]    Frequency of Communication with Friends and Family: More than three times a week    Frequency of Social Gatherings with Friends and Family: More than three times a week    Attends Religious Services: More than 4 times per year    Active Member of  Clubs or Organizations: No    Attends Banker Meetings: Never    Marital Status: Married  Catering manager Violence: Not At Risk (06/23/2018)   Humiliation, Afraid, Rape, and Kick questionnaire    Fear of Current or Ex-Partner: No    Emotionally Abused: No    Physically Abused: No    Sexually Abused: No   M hx multiple SA and died of Suicide at 20 yo.  Her aunt also. 2 of 3 kids on Vyvanse and done well. Past Medical History, Surgical history, Social history, and Family history were reviewed and updated as appropriate.   Please see review of systems for further details on the patient's review from today.      Objective:   Physical Exam:  There were no vitals taken for this visit.  Physical Exam Constitutional:      General:  She is not in acute distress.    Appearance: Normal appearance. She is well-developed and normal weight.  Neurological:     Mental Status: She is alert and oriented to person, place, and time.     Cranial Nerves: No dysarthria.     Coordination: Coordination normal.  Psychiatric:        Attention and Perception: Attention and perception normal. She does not perceive auditory or visual hallucinations.        Mood and Affect: Mood is not anxious or depressed. Affect is not labile, blunt, angry or inappropriate.        Speech: Speech normal. Speech is not slurred.        Behavior: Behavior normal. Behavior is cooperative.        Thought Content: Thought content normal. Thought content is not paranoid or delusional. Thought content does not include homicidal or suicidal ideation. Thought content does not include suicidal plan.        Cognition and Memory: Cognition and memory normal.        Judgment: Judgment normal.     Comments: Overall doing very well with regard to mood.  Dramatically better than 2019.     Lab Review:     Component Value Date/Time   NA 137 10/31/2018 1422   K 4.4 10/31/2018 1422   CL 98 10/31/2018 1422   CO2 25 10/31/2018 1422   GLUCOSE 88 10/31/2018 1422   GLUCOSE 134 (H) 06/18/2018 1821   BUN 16 10/31/2018 1422   CREATININE 0.67 10/31/2018 1422   CALCIUM 9.9 10/31/2018 1422   PROT 7.1 06/18/2018 1821   PROT 7.6 03/31/2016 0932   ALBUMIN 4.6 06/18/2018 1821   ALBUMIN 5.1 03/31/2016 0932   AST 20 06/18/2018 1821   ALT 16 06/18/2018 1821   ALKPHOS 40 06/18/2018 1821   BILITOT 0.6 06/18/2018 1821   BILITOT 0.4 03/31/2016 0932   GFRNONAA 101 10/31/2018 1422   GFRAA 116 10/31/2018 1422       Component Value Date/Time   WBC 5.9 08/19/2020 1610   RBC 4.33 08/19/2020 1610   HGB 13.3 08/19/2020 1610   HCT 40.3 08/19/2020 1610   PLT 290.0 08/19/2020 1610   MCV 93.2 08/19/2020 1610   MCH 31.4 06/18/2018 1821   MCHC 33.0 08/19/2020 1610   RDW 12.9 08/19/2020  1610   LYMPHSABS 1.7 08/19/2020 1610   MONOABS 0.5 08/19/2020 1610   EOSABS 0.0 08/19/2020 1610   BASOSABS 0.0 08/19/2020 1610    Lithium Lvl  Date Value Ref Range Status  08/27/2019 0.3 (L) 0.6 - 1.2 mmol/L Final    Comment:  Detection Limit = 0.1                           <0.1 indicates None Detected    on 600mg /D  No results found for: "PHENYTOIN", "PHENOBARB", "VALPROATE", "CBMZ"   .res Assessment: Plan:    Q[place]a was seen today for follow-up and sleeping problem.  Diagnoses and all orders for this visit:  Recurrent major depression resistant to treatment (HCC) -     vortioxetine HBr (TRINTELLIX) 20 MG TABS tablet; Take 1 tablet (20 mg total) by mouth daily.  Generalized anxiety disorder  Lithium use  Attention deficit hyperactivity disorder (ADHD), predominantly inattentive type  Severe episode of recurrent major depressive disorder, without psychotic features (HCC) -     lithium carbonate 300 MG capsule; Take 1 capsule (300 mg total) by mouth at bedtime.     We discussed her history of severe treatment resistant major depression and anxiety.  We reviewed the long list of failed psychiatric medications in detail.  Many of which were more intolerances and did not receive adequate trials.   Her symptoms have been very severe and fortunately have now been controlled for over a year with Trintellix 20 and lithium 300 mg daily.  Overall she is better than before the current meds.  She historically has doubted the benefit of lithium but in the opinion of the physician this low-dose lithium is likely helping significantly.  Gave copy Dr. Elvera Lennox post article on lithium and disc it.  Disc risk relapse. She wants to stay at 300 mg lithium daily.    Completed DBT  Counseled patient regarding potential benefits, risks, and side effects of lithium to include potential risk of lithium affecting thyroid and renal function.  Discussed need for periodic  lab monitoring to determine drug level and to assess for potential adverse effects.  Counseled patient regarding signs and symptoms of lithium toxicity and advised that they notify office immediately or seek urgent medical attention if experiencing these signs and symptoms.  Patient advised to contact office with any questions or concerns. She is taking a very tiny dose of lithium. Discussed the need to monitor kidney function, thyroid function (she is on thyroid medication) and calcium yearly.  Her dosage of lithium is so low blood levels are unnecessary.  Continue Trintellix 20mg  daily.   No change indicated.  This is still the best option at preventing a relapse.  We discussed long-term risks with antidepressants which are minimal.  No evidence than any change would result in an improvement in that risk either.  As for refill of lorazepam 0.5 mg every 8 hours as needed anxiety.  She uses it very rarely.  9-12 mos bc stable 5 years.  Call prn.  She agreed with the plan.  Meredith Staggers, MD, DFAPA  No future appointments.    No orders of the defined types were placed in this encounter.

## 2024-03-22 ENCOUNTER — Encounter: Payer: Self-pay | Admitting: Psychiatry

## 2024-03-22 ENCOUNTER — Ambulatory Visit: Payer: BC Managed Care – PPO | Admitting: Psychiatry

## 2024-03-22 DIAGNOSIS — F339 Major depressive disorder, recurrent, unspecified: Secondary | ICD-10-CM

## 2024-03-22 DIAGNOSIS — F411 Generalized anxiety disorder: Secondary | ICD-10-CM

## 2024-03-22 DIAGNOSIS — F332 Major depressive disorder, recurrent severe without psychotic features: Secondary | ICD-10-CM | POA: Diagnosis not present

## 2024-03-22 DIAGNOSIS — Z79899 Other long term (current) drug therapy: Secondary | ICD-10-CM | POA: Diagnosis not present

## 2024-03-22 MED ORDER — LITHIUM CARBONATE 300 MG PO CAPS: 300.0000 mg | ORAL_CAPSULE | Freq: Every evening | ORAL | 3 refills | Status: AC

## 2024-03-22 MED ORDER — VORTIOXETINE HBR 20 MG PO TABS: 20.0000 mg | ORAL_TABLET | Freq: Every day | ORAL | 3 refills | Status: AC

## 2024-03-22 NOTE — Progress Notes (Signed)
 Autumn Foster [id number] 03/08/1966 58 y.o.     Subjective:   Patient ID:  Autumn Foster is a 58 y.o. (DOB Sep 22, 1966) female.  Chief Complaint:  Chief Complaint  Patient presents with   Follow-up   Depression    Autumn Foster presents to the office today for follow-up of TR severe, major depression and anxiety.  on December 13, 2018.  At that visit we increase Trintellix  to 30 mg for treatment resistant depression.  At visit May Vyvanse  20 added Vyvanse  augmentation for depression and attention and focus and productivity 20 mg daily. Got alittle more benefit for mood, energy, productivity. More normal.   Lost most off the mirtazapine  weight.  AT visit January 24, 2019.  The patient wanted to wean off mirtazapine  because of weight gain though she has had relapse when she is done this before.  At visit in August 09, 2019.  Lithium  was reduced to 450 mg daily due to side effect complaints.   She remained on Trintellix  30 mg daily and remained off Vyvanse  20 mg daily.   Repeat lithium  level at that dose was 0.3.  The prior 2 levels had been 0.4 and 0.3. TSH remains stable and reduced at 0.200 on levothyroxine  and Cytomel  10 mg as an augmentation strategy.  visit December 2020.  The following was noted: She reduced the lithium  again to 300 mg daily and urination issues.  Still hungry and active  But can't lose weight and it's frustrating.  Logs her food.   Mental health is really good considering. Not depressed nor anxious.   Able to go to work.  Stopped Vyvanse  bc urinary urgency and ravenous when it wore off at the end of the day.  It is a little better off the Vyvanse  20.  Urinary problem not resolved off the Vyvanse .   Plan:No benefit with reduction of lithium  in  urinary SE and weight gain,  Therefore, Increase back 450 mg daily.  February 13, 2020 appointment, the following is noted: She increased lithium  to 450 mg for a couple of mos and didn't notice any change so reduced the  lithium  to 300 mg daily again.  Able to lose to 166# but wants to be at 162#.  Keto diet helped.  Gotten my self control back. No depression.  Always a little anxiety and still doing DBT.  Feels a little racy.  Sleep well.  Tired most of the time. No coffee bc it wires her.  Grateful to be better. Plan: No benefit with reduction of lithium  in  urinary SE and weight gain,  Therefore, Increase back 450 mg daily. Disc risk relapse. She wants to stay at 300 mg lithium  daily.  If she sees any increase in depressive symptoms she was strongly encouraged to increase it back to 450 mg daily. Continue Trintellix  30 mg daily  2020-09-02 appointment with the following noted: Great.  Some challenging life changes and working in therapy.  H new job in [place]. Youngest D graduating.  Will have to quit her job next summer and that's been good for her mental health.  Concerns about so many things happening at once.  Work 25 hours per week.  Needs to still come back to [place] to help out father. Overall depression better since summer 2020 and thinks better off the mirtazapine . Plan no med changes: Continue Trintellix  30 mg daily  03/03/2021 appointment with the following noted: Stopped Trintellix  10 and now down to 20 mg daily for over a  month and is OK so far.  Doing really well.  Major life changes ongoing.  Father to asst living and sold his house.  He's not great.  In process of moving to [place].  Had to quit her job and worries bc it was good for me.  Plans to look for PT job in fall.  I do better with schedule. Patient reports stable mood and denies depressed or irritable moods.  Patient denies any recent difficulty with anxiety.  Patient denies difficulty with sleep initiation or maintenance. Denies appetite disturbance.  Patient reports that energy and motivation have been good.  Patient denies any difficulty with concentration.  Patient denies any suicidal ideation. Doesn't want to go back to depression bc it  was so bad. Plan: Continue Trintellix  20 mg daily with lithium  300 mg daily  09/03/2021 appointment with the following noted: Doing great.  Stressful year moving to [place] and sold dad's house.  Handled it well. Continue Trintellix  20 mg daily with lithium  300 mg daily.  No SE Less super emotional than she used to be.  Evened out.  Good enjoyment and interests.  Enjoys people and [employer] football games.   Walking and weight lifting. F depressed over declining health.  Asked about a therapist for him. Patient reports stable mood and denies depressed or irritable moods.  Patient denies any recent difficulty with anxiety.  Patient denies difficulty with sleep initiation or maintenance. Denies appetite disturbance.  Patient reports that energy and motivation have been good.  Patient denies any difficulty with concentration.  Patient denies any suicidal ideation. Extremely rare lorazepam . Plan no med changes  03/08/2022 appointment with the following noted: Doing great.  A little concerned that she is not feeling expected lows. Son 41 dx with cardiomyopathy is serious and might die.  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 Heart failure. Known it 10 days.  Living with them now. Was easy crier.   Living in [place].   D got engaged.  Also stress of F being ill and she's caretaking. Christian.  09/21/22 appt noted: Reduced Trintellix  to 15 mg daily end of September 2023.  4 days took 10 mg and felt cotton headed.  Not rally depressed but less great with less meds. Some worry over longterm effects of Trintellix  but quality of life is important.   Can't tell that reducing Trintellix  has enabled her emotional range to be greater. Havent' been depressed.  Has felt a little less motivated than she was.  Not dramatic. Son stable with cardiomyopathy.    03/23/23 appt noted: Meds: Trintellix  20 mg daily, lithium  300 mg daily. No SE H is 58 yo.   Still comes to [place] once or twice a week to see Dad. Still  doing well.  No sig depression or anxiety.  Doesn't want to go backwards.   Feels HRT is hleping mood also.  Robinhood integration with estrogen and testosterone  pellets and oral progesterone .   Satisfied with meds.   D getting married in [place]. 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 Son better with heart.   03/22/24 appt noted: Meds: Trintellix  20 mg daily, lithium  300 mg daily. No SE except some blunting but before was too empathetic.  Hard to cry.  Used to cry at [employer].   Strong faith.  Sometimes hard to connect spiritually.   6 years since hospital stay. Still doing great.  Functional.  Active.   Worrier by nature.   Sees integrative doctor  Also.   [employer] won't pay for [place].  Reviewed OCD scale with some catastrophisizing and intrusive thought and compulsive checking of kids.  Previous psych med trials include Poor response to SSRI. sertraline, fluoxetine ,  Lexapro, citalopram, duloxetine ,  mirazapine 60mg  helped anxiety but cause weight gain,  Viibryd which was ineffective, Deplin,  bupropion which increased her anxiety, Buspirone with celexa.   Trintellix  30 good response lithium  900 tremor. lamotrigine which caused a rash twice,  clonazepam which caused her to be sleepy and have a headache, Pristiq which caused her to "jump out of my skin",  Abilify with muscle twitches, buspirone uncontrollable crying,  Seroquel, pramipexole which cause muscle twitching, Latuda which was ineffective, Nuvigil,  gabapentin which caused her to feel tired and loopy, hydroxyzine  which cause dizziness,  trazodone  joint pain,    Hosp for SI wtith plan to cut wrists, wrote a sui note and H stopped her on Aug 25 and hosp then until 8/29 in 2019 and started PHP for 3 weeks. Helped some. Then [facility] did IV infusion #6 from9-17 to 9-26.  Resolved SI and depression much improved.  Did another ketamine infusion Sep 10, 2018 without much improvement.  M committed  suicide  Review of Systems:  Review of Systems  Constitutional:  Negative for appetite change.  Cardiovascular:  Negative for palpitations.  Gastrointestinal:  Negative for constipation and nausea.  Genitourinary:  Negative for frequency.  Musculoskeletal:  Positive for arthralgias and back pain.  Neurological:  Negative for dizziness and tremors.  Psychiatric/Behavioral:  Negative for agitation, behavioral problems, confusion, decreased concentration, hallucinations, sleep disturbance and suicidal ideas. The patient is not nervous/anxious and is not hyperactive.     Medications: I have reviewed the patient's current medications.  Current Outpatient Medications  Medication Sig Dispense Refill   Cholecalciferol (VITAMIN D3) 1.25 MG (50000 UT) CAPS Take 1 capsule by mouth daily.     desmopressin (DDAVP) 0.1 MG tablet desmopressin 0.1 mg tablet  TAKE 1 TABLET BY MOUTH TWICE A DAY WITH 1 TABLET IN THE AFTERNOON AS NEEDED     levocetirizine (XYZAL) 5 MG tablet 5 mg every evening.     levothyroxine  (SYNTHROID ) 88 MCG tablet 88 mcg.      liothyronine  (CYTOMEL ) 5 MCG tablet Take 5 mcg by mouth daily. Take 3 tabs in the morning and one in the afternoon     lithium  carbonate 300 MG capsule Take 1 capsule (300 mg total) by mouth at bedtime. 90 capsule 3   Magnesium Sulfate 70 MG CAPS Take by mouth.     Omega-3 Fatty Acids (FISH OIL PO) Take 1 g by mouth daily.     progesterone  (PROMETRIUM ) 100 MG capsule progesterone  micronized 100 mg capsule  TAKE 2 CAPSULES BY MOUTH AT BEDTIME     valACYclovir  (VALTREX ) 500 MG tablet valacyclovir  500 mg tablet  TAKE 1 TABLET BY MOUTH EVERY DAY     vortioxetine  HBr (TRINTELLIX ) 20 MG TABS tablet Take 1 tablet (20 mg total) by mouth daily. 90 tablet 3   zinc gluconate 50 MG tablet Take 50 mg by mouth daily.     No current facility-administered medications for this visit.    Medication Side Effects:   Dry mouth, worse constipation.  Allergies:  Allergies   Allergen Reactions   Lexapro [Escitalopram Oxalate] Other (See Comments)    Insomnia    Pristiq [Desvenlafaxine]     Dry heaves/dizzy   Prozac  [Fluoxetine  Hcl]     HA and Strange Dreams   Septra Ds [Sulfamethoxazole-Trimethoprim]     Jitteriness/hyper/affecting  sleep   Penicillins Rash    Childhood rash    Past Medical History:  Diagnosis Date   Anemia    Anxiety    ASCUS on Pap smear    Bacterial infection    Bladder spasm    Blood in stool    Cancer (HCC)    Basal Cell Skin Cancer   Candida vaginitis    Cervical dysplasia    Colon polyp    tubular adenoma   Depression    Dizziness 04/05/2016   Dysmenorrhea    Endometrial polyp    Fatigue    Genital warts    Hemorrhoids    Herpes    History of chicken pox    HPV (human papilloma virus) infection    Low blood pressure    Lyme disease    Menorrhagia    Orthostatic hypotension 04/05/2016   Osteopenia    Ovarian cyst    PMDD (premenstrual dysphoric disorder)    POTS (postural orthostatic tachycardia syndrome) 04/05/2016   Rectal bleed    Urinary frequency    Urine incontinence    Yeast infection     Family History  Problem Relation Age of Onset   Prostate cancer Father    Hypertension Father    Renal cancer Father    Migraines Mother    Alcohol abuse Mother    Suicidality Mother        deceased   Colon cancer Paternal Grandfather    Dementia Paternal Grandmother    Thyroid disease Maternal Grandmother    Rheum arthritis Maternal Grandmother    Diabetes Maternal Grandfather    Anxiety disorder Sister        x 2    Social History   Socioeconomic History   Marital status: Married    Spouse name: Norvell Beers    Number of children: 3   Years of education: college   Highest education level: Not on file  Occupational History   Not on file  Tobacco Use   Smoking status: Never   Smokeless tobacco: Never  Vaping Use   Vaping status: Never Used  Substance and Sexual Activity   Alcohol use: Yes     Alcohol/week: 0.0 standard drinks of alcohol    Comment: 1-2 drinks/week   Drug use: No   Sexual activity: Yes    Partners: Male    Birth control/protection: None    Comment: vasectony  Other Topics Concern   Not on file  Social History Narrative   Drinks 1 cup of coffee a day    Social Drivers of Corporate investment banker Strain: Low Risk  (06/23/2018)   Overall Financial Resource Strain (CARDIA)    Difficulty of Paying Living Expenses: Not hard at all  Food Insecurity: No Food Insecurity (06/23/2018)   Hunger Vital Sign    Worried About Running Out of Food in the Last Year: Never true    Ran Out of Food in the Last Year: Never true  Transportation Needs: No Transportation Needs (06/23/2018)   PRAPARE - Administrator, Civil Service (Medical): No    Lack of Transportation (Non-Medical): No  Physical Activity: Sufficiently Active (06/23/2018)   Exercise Vital Sign    Days of Exercise per Week: 5 days    Minutes of Exercise per Session: 30 min  Stress: No Stress Concern Present (06/23/2018)   [employer] of Occupational Health - Occupational Stress Questionnaire    Feeling of Stress : Not at all  Social  Connections: Moderately Integrated (06/23/2018)   Social Connection and Isolation Panel [NHANES]    Frequency of Communication with Friends and Family: More than three times a week    Frequency of Social Gatherings with Friends and Family: More than three times a week    Attends Religious Services: More than 4 times per year    Active Member of [employer] or Organizations: No    Attends Banker Meetings: Never    Marital Status: Married  Catering manager Violence: Not At Risk (06/23/2018)   Humiliation, Afraid, Rape, and Kick questionnaire    Fear of Current or Ex-Partner: No    Emotionally Abused: No    Physically Abused: No    Sexually Abused: No   M hx multiple SA and died of Suicide at 55 yo.  Her aunt also. 2 of 3 kids on Vyvanse  and done  well. Past Medical History, Surgical history, Social history, and Family history were reviewed and updated as appropriate.   Please see review of systems for further details on the patient's review from today.      Objective:   Physical Exam:  There were no vitals taken for this visit.  Physical Exam Constitutional:      General: She is not in acute distress.    Appearance: Normal appearance. She is well-developed and normal weight.  Neurological:     Mental Status: She is alert and oriented to person, place, and time.     Cranial Nerves: No dysarthria.     Coordination: Coordination normal.  Psychiatric:        Attention and Perception: Attention and perception normal. She does not perceive auditory or visual hallucinations.        Mood and Affect: Mood is not anxious or depressed. Affect is not labile, blunt, angry or inappropriate.        Speech: Speech normal. Speech is not slurred.        Behavior: Behavior normal. Behavior is cooperative.        Thought Content: Thought content normal. Thought content is not paranoid or delusional. Thought content does not include homicidal or suicidal ideation. Thought content does not include suicidal plan.        Cognition and Memory: Cognition and memory normal.        Judgment: Judgment normal.     Comments: Overall doing very well with regard to mood.  Dramatically better than 2019.  In remission     Lab Review:     Component Value Date/Time   NA 137 10/31/2018 1422   K 4.4 10/31/2018 1422   CL 98 10/31/2018 1422   CO2 25 10/31/2018 1422   GLUCOSE 88 10/31/2018 1422   GLUCOSE 134 (H) 06/18/2018 1821   BUN 16 10/31/2018 1422   CREATININE 0.67 10/31/2018 1422   CALCIUM 9.9 10/31/2018 1422   PROT 7.1 06/18/2018 1821   PROT 7.6 03/31/2016 0932   ALBUMIN 4.6 06/18/2018 1821   ALBUMIN 5.1 03/31/2016 0932   AST 20 06/18/2018 1821   ALT 16 06/18/2018 1821   ALKPHOS 40 06/18/2018 1821   BILITOT 0.6 06/18/2018 1821   BILITOT 0.4  03/31/2016 0932   GFRNONAA 101 10/31/2018 1422   GFRAA 116 10/31/2018 1422       Component Value Date/Time   WBC 5.9 08/19/2020 1610   RBC 4.33 08/19/2020 1610   HGB 13.3 08/19/2020 1610   HCT 40.3 08/19/2020 1610   PLT 290.0 08/19/2020 1610   MCV 93.2 08/19/2020 1610  MCH 31.4 06/18/2018 1821   MCHC 33.0 08/19/2020 1610   RDW 12.9 08/19/2020 1610   LYMPHSABS 1.7 08/19/2020 1610   MONOABS 0.5 08/19/2020 1610   EOSABS 0.0 08/19/2020 1610   BASOSABS 0.0 08/19/2020 1610    Lithium  Lvl  Date Value Ref Range Status  08/27/2019 0.3 (L) 0.6 - 1.2 mmol/L Final    Comment:                                     Detection Limit = 0.1                           <0.1 indicates None Detected    on 600mg /D  No results found for: "PHENYTOIN", "PHENOBARB", "VALPROATE", "CBMZ"   .res Assessment: Plan:    Autumn Foster was seen today for follow-up and depression.  Diagnoses and all orders for this visit:  Recurrent major depression resistant to treatment (HCC) -     vortioxetine  HBr (TRINTELLIX ) 20 MG TABS tablet; Take 1 tablet (20 mg total) by mouth daily.  Generalized anxiety disorder  Lithium  use  Severe episode of recurrent major depressive disorder, without psychotic features (HCC) -     lithium  carbonate 300 MG capsule; Take 1 capsule (300 mg total) by mouth at bedtime.   30 min appt  We discussed her history of severe treatment resistant major depression and anxiety.  We reviewed the long list of failed psychiatric medications in detail.  Many of which were more intolerances and did not receive adequate trials.   Her symptoms have been very severe and fortunately have now been controlled for over a year with Trintellix  20 and lithium  300 mg daily.  Overall she is better than before the current meds.  She historically has doubted the benefit of lithium  but in the opinion of the physician this low-dose lithium  is likely helping significantly.  Gave copy Dr. Ardeth Beckers post article on lithium  and  disc it.  Disc risk relapse.  Good response for years.   She wants to stay at 300 mg lithium  daily.    Completed DBT  Counseled patient regarding potential benefits, risks, and side effects of lithium  to include potential risk of lithium  affecting thyroid and renal function.  Discussed need for periodic lab monitoring to determine drug level and to assess for potential adverse effects.  Counseled patient regarding signs and symptoms of lithium  toxicity and advised that they notify office immediately or seek urgent medical attention if experiencing these signs and symptoms.  Patient advised to contact office with any questions or concerns. She is taking a very tiny dose of lithium . Discussed the need to monitor kidney function, thyroid function (she is on thyroid medication) and calcium yearly.  Her dosage of lithium  is so low blood levels are unnecessary.  Continue Trintellix  20mg  daily. Extensive discussion of risks of reducing to resolve some blunting SE.  It is risky and she elects to continue  No change indicated.  Continue Trintellix  20 and lithium  300  This is still the best option at preventing a relapse.  We discussed long-term risks with antidepressants which are minimal.  No evidence than any change would result in an improvement in that risk either.  As for refill of lorazepam  0.5 mg every 8 hours as needed anxiety.  She uses it very rarely.  9-12 mos bc stable 5 years.  Call prn.  She agreed with the plan.  Nori Beat, MD, DFAPA  Future Appointments  Date Time Provider Department Center  03/25/2025 11:00 AM Cottle, Kennedy Peabody., MD [facility] None      No orders of the defined types were placed in this encounter.

## 2025-03-25 ENCOUNTER — Ambulatory Visit: Admitting: Psychiatry
